# Patient Record
Sex: Female | Born: 1980 | Race: Black or African American | Hispanic: No | Smoking: Current every day smoker
Health system: Southern US, Community
[De-identification: ages and names within clinical notes are randomized; demographics above are authoritative.]

## PROBLEM LIST (undated history)

## (undated) DIAGNOSIS — E282 Polycystic ovarian syndrome: Secondary | ICD-10-CM

## (undated) DIAGNOSIS — R0789 Other chest pain: Secondary | ICD-10-CM

## (undated) DIAGNOSIS — D6861 Antiphospholipid syndrome: Secondary | ICD-10-CM

## (undated) DIAGNOSIS — A599 Trichomoniasis, unspecified: Secondary | ICD-10-CM

## (undated) DIAGNOSIS — N3090 Cystitis, unspecified without hematuria: Secondary | ICD-10-CM

## (undated) HISTORY — DX: Polycystic ovarian syndrome: E28.2

## (undated) HISTORY — PX: CHOLECYSTECTOMY: SHX55

## (undated) HISTORY — DX: Antiphospholipid syndrome: D68.61

## (undated) HISTORY — DX: Other chest pain: R07.89

---

## 1998-12-11 ENCOUNTER — Ambulatory Visit (HOSPITAL_COMMUNITY): Admission: RE | Admit: 1998-12-11 | Discharge: 1998-12-11 | Payer: Self-pay | Admitting: Obstetrics

## 1999-01-11 ENCOUNTER — Ambulatory Visit (HOSPITAL_COMMUNITY): Admission: RE | Admit: 1999-01-11 | Discharge: 1999-01-11 | Payer: Self-pay | Admitting: Obstetrics

## 1999-02-01 ENCOUNTER — Inpatient Hospital Stay (HOSPITAL_COMMUNITY): Admission: RE | Admit: 1999-02-01 | Discharge: 1999-02-01 | Payer: Self-pay | Admitting: Obstetrics

## 1999-02-05 ENCOUNTER — Encounter (HOSPITAL_COMMUNITY): Admission: RE | Admit: 1999-02-05 | Discharge: 1999-05-03 | Payer: Self-pay | Admitting: *Deleted

## 1999-02-08 ENCOUNTER — Encounter: Payer: Self-pay | Admitting: *Deleted

## 1999-02-12 ENCOUNTER — Inpatient Hospital Stay (HOSPITAL_COMMUNITY): Admission: AD | Admit: 1999-02-12 | Discharge: 1999-02-12 | Payer: Self-pay | Admitting: *Deleted

## 1999-03-26 ENCOUNTER — Inpatient Hospital Stay (HOSPITAL_COMMUNITY): Admission: AD | Admit: 1999-03-26 | Discharge: 1999-03-26 | Payer: Self-pay | Admitting: *Deleted

## 1999-04-28 ENCOUNTER — Inpatient Hospital Stay (HOSPITAL_COMMUNITY): Admission: AD | Admit: 1999-04-28 | Discharge: 1999-04-28 | Payer: Self-pay | Admitting: *Deleted

## 1999-05-02 ENCOUNTER — Inpatient Hospital Stay (HOSPITAL_COMMUNITY): Admission: AD | Admit: 1999-05-02 | Discharge: 1999-05-04 | Payer: Self-pay | Admitting: Obstetrics

## 1999-05-07 ENCOUNTER — Inpatient Hospital Stay (HOSPITAL_COMMUNITY): Admission: AD | Admit: 1999-05-07 | Discharge: 1999-05-07 | Payer: Self-pay | Admitting: *Deleted

## 1999-07-14 ENCOUNTER — Emergency Department (HOSPITAL_COMMUNITY): Admission: EM | Admit: 1999-07-14 | Discharge: 1999-07-14 | Payer: Self-pay | Admitting: Emergency Medicine

## 1999-07-14 ENCOUNTER — Encounter: Payer: Self-pay | Admitting: Internal Medicine

## 1999-07-31 ENCOUNTER — Encounter: Payer: Self-pay | Admitting: Surgery

## 1999-07-31 ENCOUNTER — Ambulatory Visit (HOSPITAL_COMMUNITY): Admission: RE | Admit: 1999-07-31 | Discharge: 1999-08-01 | Payer: Self-pay | Admitting: Surgery

## 2000-07-29 ENCOUNTER — Inpatient Hospital Stay (HOSPITAL_COMMUNITY): Admission: AD | Admit: 2000-07-29 | Discharge: 2000-07-29 | Payer: Self-pay | Admitting: Obstetrics

## 2000-07-31 ENCOUNTER — Inpatient Hospital Stay (HOSPITAL_COMMUNITY): Admission: AD | Admit: 2000-07-31 | Discharge: 2000-07-31 | Payer: Self-pay | Admitting: *Deleted

## 2001-01-22 ENCOUNTER — Inpatient Hospital Stay (HOSPITAL_COMMUNITY): Admission: AD | Admit: 2001-01-22 | Discharge: 2001-01-22 | Payer: Self-pay | Admitting: *Deleted

## 2001-01-23 ENCOUNTER — Encounter: Payer: Self-pay | Admitting: *Deleted

## 2002-04-07 ENCOUNTER — Emergency Department (HOSPITAL_COMMUNITY): Admission: EM | Admit: 2002-04-07 | Discharge: 2002-04-07 | Payer: Self-pay

## 2002-04-08 ENCOUNTER — Encounter: Payer: Self-pay | Admitting: *Deleted

## 2003-12-09 ENCOUNTER — Emergency Department (HOSPITAL_COMMUNITY): Admission: AD | Admit: 2003-12-09 | Discharge: 2003-12-09 | Payer: Self-pay | Admitting: Family Medicine

## 2004-12-01 HISTORY — PX: COLONOSCOPY: SHX174

## 2006-06-11 ENCOUNTER — Ambulatory Visit: Payer: Self-pay | Admitting: Obstetrics & Gynecology

## 2006-07-06 ENCOUNTER — Emergency Department (HOSPITAL_COMMUNITY): Admission: EM | Admit: 2006-07-06 | Discharge: 2006-07-06 | Payer: Self-pay | Admitting: Family Medicine

## 2006-07-27 ENCOUNTER — Emergency Department (HOSPITAL_COMMUNITY): Admission: EM | Admit: 2006-07-27 | Discharge: 2006-07-27 | Payer: Self-pay | Admitting: Family Medicine

## 2006-10-16 ENCOUNTER — Ambulatory Visit: Payer: Self-pay | Admitting: Gynecology

## 2007-04-20 ENCOUNTER — Emergency Department (HOSPITAL_COMMUNITY): Admission: EM | Admit: 2007-04-20 | Discharge: 2007-04-21 | Payer: Self-pay | Admitting: Emergency Medicine

## 2007-05-10 ENCOUNTER — Emergency Department (HOSPITAL_COMMUNITY): Admission: EM | Admit: 2007-05-10 | Discharge: 2007-05-10 | Payer: Self-pay | Admitting: Family Medicine

## 2007-10-13 ENCOUNTER — Emergency Department (HOSPITAL_COMMUNITY): Admission: EM | Admit: 2007-10-13 | Discharge: 2007-10-13 | Payer: Self-pay | Admitting: Family Medicine

## 2008-01-20 ENCOUNTER — Inpatient Hospital Stay (HOSPITAL_COMMUNITY): Admission: AD | Admit: 2008-01-20 | Discharge: 2008-01-20 | Payer: Self-pay | Admitting: Obstetrics & Gynecology

## 2008-05-10 ENCOUNTER — Emergency Department (HOSPITAL_COMMUNITY): Admission: EM | Admit: 2008-05-10 | Discharge: 2008-05-10 | Payer: Self-pay | Admitting: Family Medicine

## 2008-09-10 ENCOUNTER — Emergency Department (HOSPITAL_COMMUNITY): Admission: EM | Admit: 2008-09-10 | Discharge: 2008-09-10 | Payer: Self-pay | Admitting: Family Medicine

## 2009-01-13 ENCOUNTER — Emergency Department (HOSPITAL_COMMUNITY): Admission: EM | Admit: 2009-01-13 | Discharge: 2009-01-13 | Payer: Self-pay | Admitting: Family Medicine

## 2009-10-15 ENCOUNTER — Ambulatory Visit: Payer: Self-pay | Admitting: Internal Medicine

## 2009-10-15 DIAGNOSIS — J309 Allergic rhinitis, unspecified: Secondary | ICD-10-CM | POA: Insufficient documentation

## 2009-10-15 DIAGNOSIS — F17200 Nicotine dependence, unspecified, uncomplicated: Secondary | ICD-10-CM

## 2009-10-15 DIAGNOSIS — J45909 Unspecified asthma, uncomplicated: Secondary | ICD-10-CM | POA: Insufficient documentation

## 2011-03-18 LAB — URINE CULTURE: Colony Count: >=100000

## 2011-03-18 LAB — POCT URINALYSIS DIP (DEVICE)
Bilirubin Urine: NEGATIVE
Glucose, UA: NEGATIVE mg/dL
Ketones, ur: NEGATIVE mg/dL
Nitrite: POSITIVE — AB
Protein, ur: 30 mg/dL — AB
Specific Gravity, Urine: 1.02 (ref 1.005–1.030)
Urobilinogen, UA: 1 mg/dL (ref 0.0–1.0)
pH: 7 (ref 5.0–8.0)

## 2011-03-18 LAB — POCT PREGNANCY, URINE: Preg Test, Ur: NEGATIVE

## 2011-04-18 NOTE — Group Therapy Note (Signed)
NAME:  KRISTIA, JUPITER NO.:  [id number]   MEDICAL RECORD NO.:  [id number]          PATIENT TYPE:  WOC   LOCATION:  [facility]                   FACILITY:  [facility]   PHYSICIAN:  Barth Kirks, MD    DATE OF BIRTH:  [birth year]   DATE OF SERVICE:  10/16/2006                                    CLINIC NOTE   CHIEF COMPLAINT:  Is irregular vaginal bleeding follow up.   HISTORY OF PRESENT ILLNESS:  Patient is a 30 year old African-American  female, gravida 3, para 1, abortions 2, who comes in today referred for  follow up from her GYN Clinic visit on June 11, 2006 for irregular menstrual  cycles.  The patient states that she was doing well on the Ortho-Novum 777-  28 for 2 months, however, developed a cystitis and was placed on  antibiotics.  She was instructed to get off of her birth control pills  because she was told that these would interact with each other.  Since then,  she has had irregular menstrual cycles, with her last being September 12,  which lasted for 3 days and was extremely heavy, that she was changing her  pads every 1 to 2 hours.  Her last Pap smear was in January 2007 and it was  normal.  She is currently not on any medications at this time.  Today she  comes in for improvement of her irregular menstrual cycles.  Patient does  not desire pregnancy.  Patient reports that she has tried multiple various  birth control pills and would currently like to be just on one single type.   PHYSICAL EXAMINATION:  Pulse 80, blood pressure 126/77.  Weight is 192.9  pounds.  ABDOMEN:  Was normal.  Pelvic exam was not performed today.   IMPRESSION:  She has irregular menstrual cycles.   DISPOSITION:  The patient currently does not desire a pregnancy at this  time.  I instructed her on the fact that birth control pills can be used for  multiple reasons and not just for contraception, however, also for irregular  menstrual bleeding.  The patient would like to be  restarted back on birth  control pills at this time.  I went ahead and started her on Ortho-Novum  1/35.  She can start this at the beginning of her next cycle on the first  day of her bleeding.  She is to take the packet as directed, 1 tablet for  day and have a withdrawal bleed.  [facility] sm[facility].  I went ahead and gave  her a prescription for the year.  Patient does not need to return to [place]  unless there are other problems.           ______________________________  Barth Kirks, MD     MB/MEDQ  D:  10/16/2006  T:  10/16/2006  Job:  [id number]

## 2011-04-18 NOTE — Group Therapy Note (Signed)
NAME:  [name] Costa, [name] Costa NO.:  [id number]   MEDICAL RECORD NO.:  [id number]          PATIENT TYPE:  WOC   LOCATION:  [facility]                   FACILITY:  [facility]   PHYSICIAN:  Dorthula Perfect, MD     DATE OF BIRTH:  02-14-1981   DATE OF SERVICE:                                    CLINIC NOTE   HISTORY OF PRESENT ILLNESS:  This is a 30 year old black female, gravida 3,  para 1, aborted 2, who comes in today referred from medical clinics for  evaluation of irregular menstrual periods.  She apparently had a workup by  Dr. __________  for PCOS disease.  Her laboratory studies were all normal.  Her last menstrual period was in mid May, and is described as normal.  A  week and a half before that, she had bleeding spanning for 1-1/2 weeks.  She  has had no period in June or July.  She has a history of skipping up to 3 or  4 months of periods.  She also has a history of irregular bleeding.  Two  years ago, she was on the Seasonale birth control pill, and had irregular  bleeding and spotting.  She has had the same problem with Depo-Provera and  the same problem with the NuvaRing.  Her last Pap smear was in January, and  she says it was normal.   PHYSICAL EXAMINATION:  ABDOMEN:  Normal.  PELVIC:  External genitalia and BUS glands were normal.  Hair pattern was  normal.  Vaginal wall was epithelialized as was the cervix.  Uterus is in  the midline and of normal size and shape.  The adnexa structures are normal.   IMPRESSION:  Irregular menstrual cycle.   DISPOSITION:  Patient is not desirous of pregnancy at this time.  She has  been told that at such time in the future, if she desires to become  pregnant, that she may have to be on medication to improve her ovulation  status.  She is given a prescription for Provera, 7 tablets, to take daily  to bring on her menstrual period.  She is cautioned that this next period  might indeed be much heavier.  She will then start  Ortho-Novum 7/7/7 28.  She is asked to return in 4 months for followup to see if this will give her  cyclic and normal periods.           ______________________________  Dorthula Perfect, MD     ER/MEDQ  D:  06/11/2006  T:  06/11/2006  Job:  [id number]

## 2011-09-09 LAB — POCT URINALYSIS DIP (DEVICE)
Bilirubin Urine: NEGATIVE
Glucose, UA: NEGATIVE
Operator id: 239701
Specific Gravity, Urine: 1.015

## 2012-09-27 LAB — OB RESULTS CONSOLE ANTIBODY SCREEN: Antibody Screen: NEGATIVE

## 2012-09-27 LAB — OB RESULTS CONSOLE HEPATITIS B SURFACE ANTIGEN: Hepatitis B Surface Ag: NEGATIVE

## 2012-09-27 LAB — OB RESULTS CONSOLE RUBELLA ANTIBODY, IGM: Rubella: IMMUNE

## 2012-09-27 LAB — OB RESULTS CONSOLE GC/CHLAMYDIA: Chlamydia: NEGATIVE

## 2012-09-27 LAB — OB RESULTS CONSOLE HIV ANTIBODY (ROUTINE TESTING): HIV: NONREACTIVE

## 2012-10-30 ENCOUNTER — Encounter (HOSPITAL_COMMUNITY): Payer: Self-pay | Admitting: *Deleted

## 2012-10-30 ENCOUNTER — Emergency Department (HOSPITAL_COMMUNITY)
Admission: EM | Admit: 2012-10-30 | Discharge: 2012-10-31 | Disposition: A | Discharge status: Home / Self Care | Payer: Medicaid Other | Attending: Emergency Medicine | Admitting: Emergency Medicine

## 2012-10-30 ENCOUNTER — Emergency Department (HOSPITAL_COMMUNITY)
Admission: EM | Admit: 2012-10-30 | Discharge: 2012-10-30 | Disposition: A | Discharge status: Home / Self Care | Payer: Self-pay

## 2012-10-30 DIAGNOSIS — R51 Headache: Secondary | ICD-10-CM | POA: Insufficient documentation

## 2012-10-30 DIAGNOSIS — O99891 Other specified diseases and conditions complicating pregnancy: Secondary | ICD-10-CM | POA: Insufficient documentation

## 2012-10-30 NOTE — ED Notes (Addendum)
Pt c/o pain to the left side of her head behind her left ear. **side note**pt is [redacted] wks pregnant

## 2012-10-31 MED ORDER — HYDROCODONE-ACETAMINOPHEN 5-325 MG PO TABS: ORAL_TABLET | ORAL | Status: DC

## 2012-10-31 MED ORDER — HYDROMORPHONE HCL PF 1 MG/ML IJ SOLN
1.0000 mg | Freq: Once | INTRAMUSCULAR | Status: DC
  Filled 2012-10-31: qty 1

## 2012-10-31 MED ORDER — HYDROCODONE-ACETAMINOPHEN 5-325 MG PO TABS
2.0000 | ORAL_TABLET | Freq: Once | ORAL | Status: AC
  Administered 2012-10-31: 2 via ORAL
  Filled 2012-10-31: qty 2

## 2012-10-31 MED ORDER — PROMETHAZINE HCL 12.5 MG PO TABS
12.5000 mg | ORAL_TABLET | Freq: Once | ORAL | Status: AC
  Administered 2012-10-31: 12.5 mg via ORAL
  Filled 2012-10-31: qty 1

## 2012-10-31 NOTE — ED Notes (Signed)
Pt c/o left sided pressure in the head behind the left ear.  States it is very tender to the touch in that area.  Denies any trauma to the area.  States that for she has had sinus congestion for the past couple weeks and today sneezed and felt her ear "pop" which increased the pain and brought her to the ED.  Pt also reports that she is [redacted] weeks pregnant.

## 2012-10-31 NOTE — ED Provider Notes (Signed)
Medical screening examination/treatment/procedure(s) were performed by non-physician practitioner and as supervising physician I was immediately available for consultation/collaboration.    Vida Roller, MD 10/31/12 [phone]

## 2012-10-31 NOTE — ED Provider Notes (Signed)
History     CSN: [id number]  Arrival date & time 10/30/12  2252   First MD Initiated Contact with Patient 10/30/12 2359      Chief Complaint  Patient presents with  . Headache    (Consider location/radiation/quality/duration/timing/severity/associated sxs/prior treatment) HPI Comments: Patient states she is [redacted] weeks pregnant. She had A." a severe upper respiratory infection" approximately a week ago. She still has some congestion. The patient complains of a headache that is mostly on the left side that is behind the ear and going around to the posterior scalp area. Patient denies any recent injury or trauma to that area. She does not have a history of migraine related headaches. She's not had any vision changes. His been no weakness. His been no changes in her speech or ability to swallow. There's been no changes in her gait per the patient. Patient presents at this time for assistance with her discomfort.  Patient is a 31 y.o. female presenting with headaches. The history is provided by the patient.  Headache  This is a new problem. The current episode started yesterday. The problem occurs hourly. The problem has been gradually worsening. The headache is associated with nothing. The pain is located in the left unilateral region. Quality: sore. The pain is moderate. The pain does not radiate. Pertinent negatives include no palpitations and no shortness of breath.    History reviewed. No pertinent past medical history.  Past Surgical History  Procedure Date  . Cholecystectomy     History reviewed. No pertinent family history.  History  Substance Use Topics  . Smoking status: Never Smoker   . Smokeless tobacco: Not on file  . Alcohol Use: No    OB History    Grav Para Term Preterm Abortions TAB SAB Ect Mult Living   1               Review of Systems  Constitutional: Negative for activity change.       All ROS Neg except as noted in HPI  HENT: Negative for nosebleeds and  neck pain.   Eyes: Negative for photophobia and discharge.  Respiratory: Negative for cough, shortness of breath and wheezing.   Cardiovascular: Negative for chest pain and palpitations.  Gastrointestinal: Negative for abdominal pain and blood in stool.  Genitourinary: Negative for dysuria, frequency and hematuria.  Musculoskeletal: Negative for back pain and arthralgias.  Skin: Negative.   Neurological: Positive for headaches. Negative for dizziness, seizures and speech difficulty.  Psychiatric/Behavioral: Negative for hallucinations and confusion.    Allergies  Review of patient's allergies indicates no known allergies.  Home Medications   Current Outpatient Rx  Name  Route  Sig  Dispense  Refill  . PRE-NATAL PO   Oral   Take 1 tablet by mouth.           BP 115/61  Pulse 80  Temp 98.1 F (36.7 C) (Oral)  Resp 18  Ht 5\' 11"  (1.803 m)  Wt 193 lb (87.544 kg)  BMI 26.92 kg/m2  SpO2 100%  Physical Exam  Nursing note and vitals reviewed. Constitutional: She is oriented to person, place, and time. She appears well-developed and well-nourished.  Non-toxic appearance.  HENT:  Head: Normocephalic.  Right Ear: Tympanic membrane and external ear normal.  Left Ear: Tympanic membrane and external ear normal.       Nasal congestion present. TM's wnl bilat. No redness or swelling behind the ears. Pain to palpation of the posterior left scalp.  Eyes:  EOM and lids are normal. Pupils are equal, round, and reactive to light.  Neck: Normal range of motion. Neck supple. Carotid bruit is not present.  Cardiovascular: Normal rate, regular rhythm, normal heart sounds, intact distal pulses and normal pulses.   Pulmonary/Chest: Breath sounds normal. No respiratory distress.  Abdominal: Soft. Bowel sounds are normal. There is no tenderness. There is no guarding.       Gravid  Musculoskeletal: Normal range of motion.  Lymphadenopathy:       Head (right side): No submandibular adenopathy  present.       Head (left side): No submandibular adenopathy present.    She has no cervical adenopathy.  Neurological: She is alert and oriented to person, place, and time. She has normal strength. No cranial nerve deficit or sensory deficit. She exhibits normal muscle tone. Coordination normal.  Skin: Skin is warm and dry.  Psychiatric: She has a normal mood and affect. Her speech is normal.    ED Course  Procedures (including critical care time)  Labs Reviewed - No data to display No results found.   No diagnosis found.    MDM  I have reviewed nursing notes, vital signs, and all appropriate lab and imaging results for this patient. Vital signs reviewed. After oral Norco tablets, patient states headache is much improved. No gross neurologic deficits or neural exam changes at the time of discharge. Patient advised to use Afrin spray every 12 hours for 5 days only, extra strength Tylenol every 4 hours as needed for pain. Norco 5 mg #10 tablets given to be used  at bedtime only. Patient see her primary physician or GYN specialist for additional evaluation.       Kathie Dike, [place] 10/31/12 [phone]

## 2013-03-16 LAB — OB RESULTS CONSOLE GBS: GBS: NEGATIVE

## 2013-04-12 ENCOUNTER — Inpatient Hospital Stay (HOSPITAL_COMMUNITY)
Admission: AD | Admit: 2013-04-12 | Discharge: 2013-04-14 | DRG: 767 | Disposition: A | Discharge status: Home / Self Care | Payer: Medicaid Other | Source: Ambulatory Visit | Attending: Obstetrics and Gynecology | Admitting: Obstetrics and Gynecology

## 2013-04-12 ENCOUNTER — Encounter (HOSPITAL_COMMUNITY): Payer: Self-pay | Admitting: Anesthesiology

## 2013-04-12 ENCOUNTER — Inpatient Hospital Stay (HOSPITAL_COMMUNITY): Payer: Medicaid Other | Admitting: Anesthesiology

## 2013-04-12 ENCOUNTER — Encounter (HOSPITAL_COMMUNITY): Payer: Self-pay | Admitting: *Deleted

## 2013-04-12 DIAGNOSIS — Z302 Encounter for sterilization: Secondary | ICD-10-CM

## 2013-04-12 HISTORY — DX: Cystitis, unspecified without hematuria: N30.90

## 2013-04-12 HISTORY — DX: Trichomoniasis, unspecified: A59.9

## 2013-04-12 LAB — CBC
HCT: 33.1 % — ABNORMAL LOW (ref 36.0–46.0)
Hemoglobin: 10.6 g/dL — ABNORMAL LOW (ref 12.0–15.0)
MCHC: 32 g/dL (ref 30.0–36.0)
MCV: 83.8 fL (ref 78.0–100.0)

## 2013-04-12 LAB — POCT FERN TEST: POCT Fern Test: POSITIVE

## 2013-04-12 MED ORDER — WITCH HAZEL-GLYCERIN EX PADS
1.0000 "application " | MEDICATED_PAD | CUTANEOUS | Status: DC | PRN
  Administered 2013-04-12: 1 via TOPICAL

## 2013-04-12 MED ORDER — ONDANSETRON HCL 4 MG/2ML IJ SOLN: 4.0000 mg | INTRAMUSCULAR | Status: DC | PRN

## 2013-04-12 MED ORDER — OXYTOCIN 40 UNITS IN LACTATED RINGERS INFUSION - SIMPLE MED: 62.5000 mL/h | INTRAVENOUS | Status: DC | PRN

## 2013-04-12 MED ORDER — LIDOCAINE HCL (PF) 1 % IJ SOLN
INTRAMUSCULAR | Status: DC | PRN
  Administered 2013-04-12 (×2): 5 mL

## 2013-04-12 MED ORDER — SIMETHICONE 80 MG PO CHEW
80.0000 mg | CHEWABLE_TABLET | ORAL | Status: DC | PRN
  Administered 2013-04-13: 80 mg via ORAL

## 2013-04-12 MED ORDER — ONDANSETRON HCL 4 MG PO TABS: 4.0000 mg | ORAL_TABLET | ORAL | Status: DC | PRN

## 2013-04-12 MED ORDER — ONDANSETRON HCL 4 MG/2ML IJ SOLN: 4.0000 mg | Freq: Four times a day (QID) | INTRAMUSCULAR | Status: DC | PRN

## 2013-04-12 MED ORDER — TERBUTALINE SULFATE 1 MG/ML IJ SOLN: 0.2500 mg | Freq: Once | INTRAMUSCULAR | Status: DC | PRN

## 2013-04-12 MED ORDER — IBUPROFEN 600 MG PO TABS
600.0000 mg | ORAL_TABLET | Freq: Four times a day (QID) | ORAL | Status: DC
  Administered 2013-04-13 – 2013-04-14 (×4): 600 mg via ORAL
  Filled 2013-04-12 (×4): qty 1

## 2013-04-12 MED ORDER — DIPHENHYDRAMINE HCL 50 MG/ML IJ SOLN: 12.5000 mg | INTRAMUSCULAR | Status: DC | PRN

## 2013-04-12 MED ORDER — CITRIC ACID-SODIUM CITRATE 334-500 MG/5ML PO SOLN: 30.0000 mL | ORAL | Status: DC | PRN

## 2013-04-12 MED ORDER — IBUPROFEN 600 MG PO TABS
600.0000 mg | ORAL_TABLET | Freq: Four times a day (QID) | ORAL | Status: DC | PRN
  Administered 2013-04-12: 600 mg via ORAL
  Filled 2013-04-12: qty 1

## 2013-04-12 MED ORDER — PHENYLEPHRINE 40 MCG/ML (10ML) SYRINGE FOR IV PUSH (FOR BLOOD PRESSURE SUPPORT)
80.0000 ug | PREFILLED_SYRINGE | INTRAVENOUS | Status: DC | PRN
  Filled 2013-04-12: qty 2

## 2013-04-12 MED ORDER — LACTATED RINGERS IV SOLN: 500.0000 mL | Freq: Once | INTRAVENOUS | Status: DC

## 2013-04-12 MED ORDER — OXYCODONE-ACETAMINOPHEN 5-325 MG PO TABS
1.0000 | ORAL_TABLET | ORAL | Status: DC | PRN
  Administered 2013-04-13 – 2013-04-14 (×3): 1 via ORAL
  Filled 2013-04-12 (×3): qty 1

## 2013-04-12 MED ORDER — DIBUCAINE 1 % RE OINT: 1.0000 "application " | TOPICAL_OINTMENT | RECTAL | Status: DC | PRN

## 2013-04-12 MED ORDER — SENNOSIDES-DOCUSATE SODIUM 8.6-50 MG PO TABS
2.0000 | ORAL_TABLET | Freq: Every day | ORAL | Status: DC
  Administered 2013-04-13: 2 via ORAL

## 2013-04-12 MED ORDER — OXYTOCIN BOLUS FROM INFUSION: 500.0000 mL | INTRAVENOUS | Status: DC

## 2013-04-12 MED ORDER — LACTATED RINGERS IV SOLN: INTRAVENOUS | Status: DC

## 2013-04-12 MED ORDER — OXYTOCIN 40 UNITS IN LACTATED RINGERS INFUSION - SIMPLE MED
1.0000 m[IU]/min | INTRAVENOUS | Status: DC
  Administered 2013-04-12: 1 m[IU]/min via INTRAVENOUS
  Filled 2013-04-12: qty 1000

## 2013-04-12 MED ORDER — BENZOCAINE-MENTHOL 20-0.5 % EX AERO
1.0000 "application " | INHALATION_SPRAY | CUTANEOUS | Status: DC | PRN
  Administered 2013-04-12: 1 via TOPICAL
  Filled 2013-04-12: qty 56

## 2013-04-12 MED ORDER — EPHEDRINE 5 MG/ML INJ
10.0000 mg | INTRAVENOUS | Status: DC | PRN
  Filled 2013-04-12: qty 2
  Filled 2013-04-12: qty 4

## 2013-04-12 MED ORDER — ZOLPIDEM TARTRATE 5 MG PO TABS: 5.0000 mg | ORAL_TABLET | Freq: Every evening | ORAL | Status: DC | PRN

## 2013-04-12 MED ORDER — EPHEDRINE 5 MG/ML INJ
10.0000 mg | INTRAVENOUS | Status: DC | PRN
  Filled 2013-04-12: qty 2

## 2013-04-12 MED ORDER — PHENYLEPHRINE 40 MCG/ML (10ML) SYRINGE FOR IV PUSH (FOR BLOOD PRESSURE SUPPORT)
80.0000 ug | PREFILLED_SYRINGE | INTRAVENOUS | Status: DC | PRN
  Filled 2013-04-12: qty 2
  Filled 2013-04-12: qty 5

## 2013-04-12 MED ORDER — DIPHENHYDRAMINE HCL 25 MG PO CAPS: 25.0000 mg | ORAL_CAPSULE | Freq: Four times a day (QID) | ORAL | Status: DC | PRN

## 2013-04-12 MED ORDER — FENTANYL 2.5 MCG/ML BUPIVACAINE 1/10 % EPIDURAL INFUSION (WH - ANES)
14.0000 mL/h | INTRAMUSCULAR | Status: DC | PRN
  Filled 2013-04-12: qty 125

## 2013-04-12 MED ORDER — PRENATAL MULTIVITAMIN CH: 1.0000 | ORAL_TABLET | Freq: Every day | ORAL | Status: DC

## 2013-04-12 MED ORDER — METOCLOPRAMIDE HCL 10 MG PO TABS
10.0000 mg | ORAL_TABLET | Freq: Once | ORAL | Status: AC
  Administered 2013-04-13: 10 mg via ORAL
  Filled 2013-04-12: qty 1

## 2013-04-12 MED ORDER — LANOLIN HYDROUS EX OINT: TOPICAL_OINTMENT | CUTANEOUS | Status: DC | PRN

## 2013-04-12 MED ORDER — OXYCODONE-ACETAMINOPHEN 5-325 MG PO TABS: 1.0000 | ORAL_TABLET | ORAL | Status: DC | PRN

## 2013-04-12 MED ORDER — LIDOCAINE HCL (PF) 1 % IJ SOLN
30.0000 mL | INTRAMUSCULAR | Status: DC | PRN
  Filled 2013-04-12: qty 30

## 2013-04-12 MED ORDER — LACTATED RINGERS IV SOLN
INTRAVENOUS | Status: DC
  Administered 2013-04-13: 12:00:00 via INTRAVENOUS

## 2013-04-12 MED ORDER — FAMOTIDINE 20 MG PO TABS
40.0000 mg | ORAL_TABLET | Freq: Once | ORAL | Status: AC
  Administered 2013-04-13: 40 mg via ORAL
  Filled 2013-04-12: qty 1

## 2013-04-12 MED ORDER — LACTATED RINGERS IV SOLN: 500.0000 mL | INTRAVENOUS | Status: DC | PRN

## 2013-04-12 MED ORDER — TETANUS-DIPHTH-ACELL PERTUSSIS 5-2.5-18.5 LF-MCG/0.5 IM SUSP: 0.5000 mL | Freq: Once | INTRAMUSCULAR | Status: DC

## 2013-04-12 MED ORDER — OXYTOCIN 40 UNITS IN LACTATED RINGERS INFUSION - SIMPLE MED: 62.5000 mL/h | INTRAVENOUS | Status: DC

## 2013-04-12 MED ORDER — ACETAMINOPHEN 325 MG PO TABS: 650.0000 mg | ORAL_TABLET | ORAL | Status: DC | PRN

## 2013-04-12 MED ORDER — FENTANYL 2.5 MCG/ML BUPIVACAINE 1/10 % EPIDURAL INFUSION (WH - ANES)
INTRAMUSCULAR | Status: DC | PRN
  Administered 2013-04-12: 16 mL/h via EPIDURAL

## 2013-04-12 MED ORDER — MEASLES, MUMPS & RUBELLA VAC ~~LOC~~ INJ
0.5000 mL | INJECTION | Freq: Once | SUBCUTANEOUS | Status: DC
  Filled 2013-04-12: qty 0.5

## 2013-04-12 NOTE — Anesthesia Preprocedure Evaluation (Signed)
Anesthesia Evaluation  Patient identified by MRN, date of birth, ID band Patient awake    Reviewed: Allergy & Precautions, H&P , Patient's Chart, lab work & pertinent test results  Airway Mallampati: III TM Distance: >3 FB Neck ROM: Full    Dental no notable dental hx. (+) Teeth Intact   Pulmonary asthma ,  breath sounds clear to auscultation  Pulmonary exam normal       Cardiovascular negative cardio ROS  Rhythm:Regular Rate:Normal     Neuro/Psych PSYCHIATRIC DISORDERS negative neurological ROS     GI/Hepatic negative GI ROS, Neg liver ROS,   Endo/Other  Obesity  Renal/GU negative Renal ROS  negative genitourinary   Musculoskeletal negative musculoskeletal ROS (+)   Abdominal (+) + obese,   Peds  Hematology negative hematology ROS (+)   Anesthesia Other Findings   Reproductive/Obstetrics (+) Pregnancy                           Anesthesia Physical Anesthesia Plan  ASA: II  Anesthesia Plan: Epidural   Post-op Pain Management:    Induction:   Airway Management Planned: Natural Airway  Additional Equipment:   Intra-op Plan:   Post-operative Plan:   Informed Consent: I have reviewed the patients History and Physical, chart, labs and discussed the procedure including the risks, benefits and alternatives for the proposed anesthesia with the patient or authorized representative who has indicated his/her understanding and acceptance.   Dental advisory given  Plan Discussed with: Anesthesiologist, CRNA and Surgeon  Anesthesia Plan Comments:         Anesthesia Quick Evaluation

## 2013-04-12 NOTE — Anesthesia Procedure Notes (Signed)
Epidural Patient location during procedure: OB Start time: 04/12/2013 1:24 PM  Staffing Anesthesiologist: Irineo Gaulin A. Performed by: anesthesiologist   Preanesthetic Checklist Completed: patient identified, site marked, surgical consent, pre-op evaluation, timeout performed, IV checked, risks and benefits discussed and monitors and equipment checked  Epidural Patient position: sitting Prep: site prepped and draped and DuraPrep Patient monitoring: continuous pulse ox and blood pressure Approach: midline Injection technique: LOR air  Needle:  Needle type: Tuohy  Needle gauge: 17 G Needle length: 9 cm and 9 Needle insertion depth: 6 cm Catheter type: closed end flexible Catheter size: 19 Gauge Catheter at skin depth: 11 cm Test dose: negative and Other  Assessment Events: blood not aspirated, injection not painful, no injection resistance, negative IV test and no paresthesia  Additional Notes Patient identified. Risks and benefits discussed including failed block, incomplete  Pain control, post dural puncture headache, nerve damage, paralysis, blood pressure Changes, nausea, vomiting, reactions to medications-both toxic and allergic and post Partum back pain. All questions were answered. Patient expressed understanding and wished to proceed. Sterile technique was used throughout procedure. Epidural site was Dressed with sterile barrier dressing. No paresthesias, signs of intravascular injection Or signs of intrathecal spread were encountered.  Patient was more comfortable after the epidural was dosed. Please see RN's note for documentation of vital signs and FHR which are stable.

## 2013-04-12 NOTE — Progress Notes (Signed)
Patient ID: [name] Costa, female   DOB: 08/03/1981, 32 y.o.   MRN: [id number] Pt is on 5 mu/ minute of pitocin and has an epidural. She is contracting regularly. At about 4:40 PM the cervix was 8-9 cm 100% effaced and the vertex was at +1 station.

## 2013-04-12 NOTE — Progress Notes (Signed)
Patient ID: [name] Costa, female   DOB: 01/28/1981, 32 y.o.   MRN: [id number] Delivery note:  The pt pushed well but made very slow progress because of O[P position of the vertex.Finally, the pt delivered a living female infant spontaneously OP over an intact perineum with a left labial laceration a living female infant with Apgars of 8 and 9 at 1 and 5 minutes. There was a large amount of yellow fluid over the baby's nostrils at delivery that was suctioned with the bulb. The placenta delivered intact and the uterus was normal. EBL 400 cc's.

## 2013-04-12 NOTE — MAU Note (Signed)
States water broke at 0900. Having UC's.

## 2013-04-12 NOTE — H&P (Signed)
[name] Costa Costa, [name] Costa NO.:  [id number]  MEDICAL RECORD NO.:  [id number]  LOCATION:  9169                          [facility]ILITY[facility]  PHYSICIAN:  Malachi Pro. Ambrose Mantle, M.D. DATE OF BIRTH:  11/09/1981  DATE OF ADMISSION:  04/12/2013 DATE OF DISCHARGE:                             HISTORY & PHYSICAL   PRESENT ILLNESS:  This is a 32 year old black female, para 1-0-3-1, gravida 5, with [facility] Apr 16, 2013, by an ultrasound at 9 weeks and 5 days on September 16, 2012, admitted with ruptured membranes and uterine contractions.  Blood group and type O positive.  Negative antibody.  Pap smear normal.  Rubella immune.  RPR nonreactive.  Urine culture negative.  Hepatitis B surface antigen negative.  HIV negative, GC and Chlamydia negative.  Hemoglobin electrophoresis AA.  First trimester screen negative.  Cystic fibrosis negative.  AFP negative.  One hour Glucola 87, group B strep negative.  The patient began her prenatal course on September 27, 2012, at 11 weeks and 2 days.  She had a history of delivery at 42 weeks.  She stated that she was told she had a short cervix, but this came from an ultrasound at 36 weeks and 3 days when the cervix measured 1.7 cm and I assured her that was of no importance.  The patient complained of a heartburn and was tried on Zantac, Tums.  She discussed tubal ligation with Dr. Senaida Ores.  She had a reasonably benign prenatal course at her last visit on May 9.  Cervix was 2 cm, 50%.  She chose to await for labor after being offered induction of labor.  PAST MEDICAL HISTORY:  History of anemia, arthritis, asthma and a swollen duct in her breast, also had a history of situational depression, polycystic ovarian syndrome, and vaginitis from Trichomonas.  SURGICAL HISTORY:  Laparoscopic cholecystectomy, diagnostic colonoscopy for rectal bleeding.  Polyps were removed none since.  She has no known allergy and no latex allergy.  FAMILY HISTORY:  Mother,  brother, father and maternal grandmother with high blood pressure.  Maternal grandmother with diabetes and breast cancer.  A cousin had bone cancer in his knee and died at age 25.  OBSTETRIC HISTORY:  In 2000, she delivered a 7-pound 3-ounce female infant vaginally at 42 weeks.  In 2001, she had a spontaneous abortion, followed by termination of pregnancy in 2001 and then in 2008 she had a termination of pregnancy.  She is active, but no formal exercise.  She has an associates degree from the [employer].  Denies illicit drug use.  Formerly smoked cigarettes from age [age], 1/2 pack a day. 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  PHYSICAL EXAMINATION:  GENERAL:  Well-developed, well-nourished black female, in no distress except the uterine contractions. VITAL SIGNS:  Temperature is 97.8, pulse 75, respirations 18, blood pressure 127/76. HEART:  Normal size and sounds.  No murmurs. LUNGS:  Clear to auscultation. ABDOMEN:  Fundal height is appropriate for gestational age.  The cervix is 3-4 cm, 70% effaced, vertex at a -3 station.  The patient states her membranes ruptured at 9:00 a.m. today.  ADMITTING IMPRESSION:  Intrauterine pregnancy at 39+ weeks, rupture of membranes, regular contractions.  The patient requested an epidural. She will receive an epidural if her labor fails to progress the cervix and will add Pitocin.     Malachi Pro. Ambrose Mantle, M.D.     TFH/MEDQ  D:  04/12/2013  T:  04/12/2013  Job:  [id number]

## 2013-04-13 ENCOUNTER — Encounter (HOSPITAL_COMMUNITY): Payer: Self-pay | Admitting: Registered Nurse

## 2013-04-13 ENCOUNTER — Encounter (HOSPITAL_COMMUNITY): Payer: Self-pay | Admitting: Anesthesiology

## 2013-04-13 ENCOUNTER — Inpatient Hospital Stay (HOSPITAL_COMMUNITY): Payer: Medicaid Other | Admitting: Anesthesiology

## 2013-04-13 ENCOUNTER — Encounter (HOSPITAL_COMMUNITY)
Admission: AD | Disposition: A | Discharge status: Home / Self Care | Payer: Self-pay | Source: Ambulatory Visit | Attending: Obstetrics and Gynecology

## 2013-04-13 HISTORY — PX: TUBAL LIGATION: SHX77

## 2013-04-13 LAB — CBC
Platelets: 209 10*3/uL (ref 150–400)
RBC: 3.61 MIL/uL — ABNORMAL LOW (ref 3.87–5.11)
RDW: 14.5 % (ref 11.5–15.5)
WBC: 20.5 10*3/uL — ABNORMAL HIGH (ref 4.0–10.5)

## 2013-04-13 LAB — ABO/RH: ABO/RH(D): O POS

## 2013-04-13 LAB — SURGICAL PCR SCREEN: MRSA, PCR: NEGATIVE

## 2013-04-13 SURGERY — LIGATION, FALLOPIAN TUBE, POSTPARTUM
Anesthesia: Epidural | Site: Abdomen | Laterality: Bilateral | Wound class: Clean Contaminated

## 2013-04-13 MED ORDER — FENTANYL CITRATE 0.05 MG/ML IJ SOLN
INTRAMUSCULAR | Status: DC | PRN
  Administered 2013-04-13 (×2): 50 ug via INTRAVENOUS

## 2013-04-13 MED ORDER — HYDROMORPHONE HCL PF 1 MG/ML IJ SOLN
INTRAMUSCULAR | Status: AC
  Administered 2013-04-13: 0.25 mg via INTRAVENOUS
  Filled 2013-04-13: qty 1

## 2013-04-13 MED ORDER — KETOROLAC TROMETHAMINE 30 MG/ML IJ SOLN
INTRAMUSCULAR | Status: DC | PRN
  Administered 2013-04-13: 30 mg via INTRAVENOUS

## 2013-04-13 MED ORDER — 0.9 % SODIUM CHLORIDE (POUR BTL) OPTIME
TOPICAL | Status: DC | PRN
  Administered 2013-04-13: 1000 mL

## 2013-04-13 MED ORDER — LIDOCAINE HCL (CARDIAC) 20 MG/ML IV SOLN
INTRAVENOUS | Status: AC
  Filled 2013-04-13: qty 5

## 2013-04-13 MED ORDER — ONDANSETRON HCL 4 MG/2ML IJ SOLN
INTRAMUSCULAR | Status: DC | PRN
  Administered 2013-04-13: 4 mg via INTRAVENOUS

## 2013-04-13 MED ORDER — HYDROMORPHONE HCL PF 1 MG/ML IJ SOLN: 0.2500 mg | INTRAMUSCULAR | Status: DC | PRN

## 2013-04-13 MED ORDER — ONDANSETRON HCL 4 MG/2ML IJ SOLN
INTRAMUSCULAR | Status: AC
  Filled 2013-04-13: qty 2

## 2013-04-13 MED ORDER — MIDAZOLAM HCL 2 MG/2ML IJ SOLN
INTRAMUSCULAR | Status: AC
  Filled 2013-04-13: qty 2

## 2013-04-13 MED ORDER — MEPERIDINE HCL 25 MG/ML IJ SOLN: 6.2500 mg | INTRAMUSCULAR | Status: DC | PRN

## 2013-04-13 MED ORDER — LIDOCAINE-EPINEPHRINE (PF) 2 %-1:200000 IJ SOLN
INTRAMUSCULAR | Status: AC
  Filled 2013-04-13: qty 20

## 2013-04-13 MED ORDER — SODIUM BICARBONATE 8.4 % IV SOLN
INTRAVENOUS | Status: AC
  Filled 2013-04-13: qty 50

## 2013-04-13 MED ORDER — PROPOFOL 10 MG/ML IV BOLUS
INTRAVENOUS | Status: DC | PRN
  Administered 2013-04-13: 20 mg via INTRAVENOUS

## 2013-04-13 MED ORDER — KETOROLAC TROMETHAMINE 30 MG/ML IJ SOLN
INTRAMUSCULAR | Status: AC
  Filled 2013-04-13: qty 1

## 2013-04-13 MED ORDER — PROMETHAZINE HCL 25 MG/ML IJ SOLN: 6.2500 mg | INTRAMUSCULAR | Status: DC | PRN

## 2013-04-13 MED ORDER — DEXAMETHASONE SODIUM PHOSPHATE 10 MG/ML IJ SOLN
INTRAMUSCULAR | Status: AC
  Filled 2013-04-13: qty 1

## 2013-04-13 MED ORDER — LIDOCAINE HCL (CARDIAC) 20 MG/ML IV SOLN
INTRAVENOUS | Status: DC | PRN
  Administered 2013-04-13: 30 mg via INTRAVENOUS

## 2013-04-13 MED ORDER — FENTANYL CITRATE 0.05 MG/ML IJ SOLN
INTRAMUSCULAR | Status: AC
  Filled 2013-04-13: qty 2

## 2013-04-13 MED ORDER — PROPOFOL 10 MG/ML IV EMUL
INTRAVENOUS | Status: AC
  Filled 2013-04-13: qty 20

## 2013-04-13 MED ORDER — KETOROLAC TROMETHAMINE 30 MG/ML IJ SOLN: 15.0000 mg | Freq: Once | INTRAMUSCULAR | Status: DC | PRN

## 2013-04-13 MED ORDER — MIDAZOLAM HCL 5 MG/5ML IJ SOLN
INTRAMUSCULAR | Status: DC | PRN
  Administered 2013-04-13: 2 mg via INTRAVENOUS

## 2013-04-13 MED ORDER — SODIUM BICARBONATE 8.4 % IV SOLN
INTRAVENOUS | Status: DC | PRN
  Administered 2013-04-13: 5 mL via EPIDURAL

## 2013-04-13 SURGICAL SUPPLY — 23 items
CLOTH BEACON ORANGE TIMEOUT ST (SAFETY) ×2 IMPLANT
CONTAINER PREFILL 10% NBF 15ML (MISCELLANEOUS) ×4 IMPLANT
DRSG COVADERM PLUS 2X2 (GAUZE/BANDAGES/DRESSINGS) ×1 IMPLANT
ELECT REM PT RETURN 9FT ADLT (ELECTROSURGICAL) ×2
ELECTRODE REM PT RTRN 9FT ADLT (ELECTROSURGICAL) ×1 IMPLANT
GLOVE BIO SURGEON STRL SZ7.5 (GLOVE) ×2 IMPLANT
GOWN PREVENTION PLUS LG XLONG (DISPOSABLE) ×2 IMPLANT
GOWN PREVENTION PLUS XLARGE (GOWN DISPOSABLE) ×2 IMPLANT
NS IRRIG 1000ML POUR BTL (IV SOLUTION) ×2 IMPLANT
PACK ABDOMINAL MINOR (CUSTOM PROCEDURE TRAY) ×2 IMPLANT
PENCIL BUTTON HOLSTER BLD 10FT (ELECTRODE) ×2 IMPLANT
SPONGE LAP 4X18 X RAY DECT (DISPOSABLE) IMPLANT
SUT PDS AB 1 CT1 36 (SUTURE) ×1 IMPLANT
SUT PLAIN 0 NONE (SUTURE) ×4 IMPLANT
SUT PLAIN 2 0 (SUTURE)
SUT PLAIN 3 0 X 1 18 (SUTURE) ×2 IMPLANT
SUT PLAIN ABS 2-0 54XMFL TIE (SUTURE) IMPLANT
SUT VIC AB 0 CT1 27 (SUTURE) ×2
SUT VIC AB 0 CT1 27XBRD ANBCTR (SUTURE) ×1 IMPLANT
SUT VIC AB 3-0 CTX 36 (SUTURE) IMPLANT
TOWEL OR 17X24 6PK STRL BLUE (TOWEL DISPOSABLE) ×4 IMPLANT
TRAY FOLEY CATH 14FR (SET/KITS/TRAYS/PACK) ×2 IMPLANT
WATER STERILE IRR 1000ML POUR (IV SOLUTION) ×2 IMPLANT

## 2013-04-13 NOTE — Progress Notes (Signed)
Patient ID: [name] Costa, female   DOB: 02/10/1981, 32 y.o.   MRN: [id number] #1 Afebrile BP normal HGB stable Scheduled for tubal if sterilization forms can be found

## 2013-04-13 NOTE — Anesthesia Postprocedure Evaluation (Signed)
Anesthesia Post Note  Patient: [name] Costa  Procedure(s) Performed: * No procedures listed *  Anesthesia type: Epidural  Patient location: Mother/Baby  Post pain: Pain level controlled  Post assessment: Post-op Vital signs reviewed  Last Vitals: BP 130/77  Pulse 76  Temp(Src) 36.6 C (Oral)  Resp 18  Ht 5\' 11"  (1.803 m)  Wt 230 lb (104.327 kg)  BMI 32.09 kg/m2  SpO2 99%  Post vital signs: Reviewed  Level of consciousness: awake  Complications: No apparent anesthesia complications

## 2013-04-13 NOTE — Progress Notes (Signed)
Patient was referred for history of depression/anxiety. * Referral screened out by Clinical Social Worker because none of the following criteria appear to apply:  ~ History of anxiety/depression during this pregnancy, or of post-partum depression.  ~ Diagnosis of anxiety and/or depression within last 4 years, as per pt.  ~ History of depression due to pregnancy loss/loss of child  OR * Patient's symptoms currently being treated with medication and/or therapy.  Please contact the Clinical Social Worker if needs arise, or by the patient's request.

## 2013-04-13 NOTE — Preoperative (Signed)
Beta Blockers   Reason not to administer Beta Blockers:Not Applicable 

## 2013-04-13 NOTE — Anesthesia Postprocedure Evaluation (Signed)
Anesthesia Post Note  Patient: [name] Costa  Procedure(s) Performed: Procedure(s) (LRB): POST PARTUM TUBAL LIGATION (Bilateral)  Anesthesia type: Epidural  Patient location: PACU  Post pain: Pain level controlled  Post assessment: Post-op Vital signs reviewed  Last Vitals:  Filed Vitals:   04/13/13 1415  BP: 122/66  Pulse: 71  Temp:   Resp: 16    Post vital signs: Reviewed  Level of consciousness: awake  Complications: No apparent anesthesia complications

## 2013-04-13 NOTE — Anesthesia Preprocedure Evaluation (Signed)
Anesthesia Evaluation  Patient identified by MRN, date of birth, ID band Patient awake    Reviewed: Allergy & Precautions, H&P , Patient's Chart, lab work & pertinent test results  Airway Mallampati: III TM Distance: >3 FB Neck ROM: Full    Dental no notable dental hx. (+) Teeth Intact   Pulmonary asthma ,  breath sounds clear to auscultation  Pulmonary exam normal       Cardiovascular negative cardio ROS  Rhythm:Regular Rate:Normal     Neuro/Psych PSYCHIATRIC DISORDERS negative neurological ROS     GI/Hepatic negative GI ROS, Neg liver ROS,   Endo/Other  Obesity  Renal/GU negative Renal ROS  negative genitourinary   Musculoskeletal negative musculoskeletal ROS (+)   Abdominal (+) + obese,   Peds  Hematology negative hematology ROS (+)   Anesthesia Other Findings   Reproductive/Obstetrics negative OB ROS                           Anesthesia Physical  Anesthesia Plan  ASA: II  Anesthesia Plan: Epidural   Post-op Pain Management:    Induction:   Airway Management Planned: Natural Airway  Additional Equipment:   Intra-op Plan:   Post-operative Plan:   Informed Consent: I have reviewed the patients History and Physical, chart, labs and discussed the procedure including the risks, benefits and alternatives for the proposed anesthesia with the patient or authorized representative who has indicated his/her understanding and acceptance.   Dental advisory given  Plan Discussed with: Anesthesiologist, CRNA and Surgeon  Anesthesia Plan Comments:         Anesthesia Quick Evaluation

## 2013-04-13 NOTE — Transfer of Care (Signed)
Immediate Anesthesia Transfer of Care Note  Patient: [name] Costa  Procedure(s) Performed: Procedure(s): POST PARTUM TUBAL LIGATION (Bilateral)  Patient Location: PACU  Anesthesia Type:Epidural  Level of Consciousness: awake, alert  and oriented  Airway & Oxygen Therapy: Patient Spontanous Breathing  Post-op Assessment: Report given to PACU RN  Post vital signs: Reviewed  Complications: No apparent anesthesia complications

## 2013-04-13 NOTE — Progress Notes (Signed)
UR chart review completed.  

## 2013-04-13 NOTE — Progress Notes (Signed)
Patient ID: [name] Costa, female   DOB: Oct 18, 1981, 32 y.o.   MRN: [id number] Op Note:  BTL done with epidural anesthesia. No complications apparent EBL 10  Cc's.

## 2013-04-14 ENCOUNTER — Encounter (HOSPITAL_COMMUNITY): Payer: Self-pay | Admitting: Obstetrics and Gynecology

## 2013-04-14 LAB — CBC WITH DIFFERENTIAL/PLATELET
Basophils Absolute: 0 10*3/uL (ref 0.0–0.1)
Basophils Relative: 0 % (ref 0–1)
Eosinophils Absolute: 0.2 10*3/uL (ref 0.0–0.7)
HCT: 29 % — ABNORMAL LOW (ref 36.0–46.0)
Hemoglobin: 9.4 g/dL — ABNORMAL LOW (ref 12.0–15.0)
MCH: 27.3 pg (ref 26.0–34.0)
MCHC: 32.4 g/dL (ref 30.0–36.0)
Monocytes Absolute: 1 10*3/uL (ref 0.1–1.0)
Monocytes Relative: 7 % (ref 3–12)
Neutro Abs: 11.2 10*3/uL — ABNORMAL HIGH (ref 1.7–7.7)
Neutrophils Relative %: 72 % (ref 43–77)
RDW: 14.8 % (ref 11.5–15.5)

## 2013-04-14 MED ORDER — OXYCODONE-ACETAMINOPHEN 5-325 MG PO TABS: 1.0000 | ORAL_TABLET | Freq: Four times a day (QID) | ORAL | Status: DC | PRN

## 2013-04-14 MED ORDER — IBUPROFEN 600 MG PO TABS: 600.0000 mg | ORAL_TABLET | Freq: Four times a day (QID) | ORAL | Status: DC | PRN

## 2013-04-14 MED ORDER — BUPIVACAINE HCL (PF) 0.25 % IJ SOLN
INTRAMUSCULAR | Status: AC
  Filled 2013-04-14: qty 30

## 2013-04-14 NOTE — Op Note (Signed)
NAMEBLYTHE, [name] Costa NO.:  [id number]  MEDICAL RECORD NO.:  [id number]  LOCATION:  9111                          FACILITY:  [facility]  PHYSICIAN:  Malachi Pro. Ambrose Mantle, M.D. DATE OF BIRTH:  1980-12-29  DATE OF PROCEDURE:  04/13/2013 DATE OF DISCHARGE:                              OPERATIVE REPORT   PREOPERATIVE DIAGNOSIS:  Voluntary sterilization.  POSTOPERATIVE DIAGNOSIS:  Voluntary sterilization.  OPERATION:  Bilateral tubal ligation.  OPERATOR:  Malachi Pro. Ambrose Mantle, M.D.  ANESTHESIA:  Epidural anesthesia.  DESCRIPTION OF PROCEDURE:  The patient was brought to the operating room.  The epidural anesthetic was boosted.  A Foley catheter was inserted after the urethra was prepped.  The abdomen was then prepped with a DuraPrep and waited 3 minutes for it to dry.  A time-out was done.  The abdomen was then draped as a sterile field.  A small semi- lunar incision was made in the inferior portion of the umbilicus, it was carried in layers down to the fascia.  The fascia was incised. Peritoneum was opened.  I needed to use 1 pack to identify the left tube and otherwise the tubes were identified, traced to their fimbriated ends.  A window was made in the mesosalpinx bilaterally.  Two ties of 0 plain catgut were placed proximally and distally.  The segment of tube on both sides was excised and sent to Pathology.  The right tubal segment was shorter than the left.  The right tubal segment was about a cm in length.  The left one was about an inch in length.  No complications were encountered.  The sponge was removed.  The fascia was closed along with the peritoneum with 2 interrupted figure-of-eight sutures of #1 PDS.  Subcutaneous tissue was closed with 3-0 Vicryl. Skin was reapproximated with 3-0 plain catgut.  The patient seemed to tolerate the procedure well and was returned to recovery in satisfactory condition.  Blood loss about 10 mL.     Malachi Pro. Ambrose Mantle,  M.D.     TFH/MEDQ  D:  04/13/2013  T:  04/14/2013  Job:  [id number]

## 2013-04-14 NOTE — Progress Notes (Signed)
Patient ID: [name] Costa, female   DOB: Apr 14, 1981, 32 y.o.   MRN: [id number] #2 afebrile doing well for d/c

## 2013-04-14 NOTE — Anesthesia Postprocedure Evaluation (Signed)
  Anesthesia Post-op Note  Patient: [name] Costa  Procedure(s) Performed: Procedure(s): POST PARTUM TUBAL LIGATION (Bilateral)  Patient Location: Mother/Baby  Anesthesia Type:Epidural  Level of Consciousness: awake, alert  and oriented  Airway and Oxygen Therapy: Patient Spontanous Breathing  Post-op Pain: mild  Post-op Assessment: Post-op Vital signs reviewed and Patient's Cardiovascular Status Stable  Post-op Vital Signs: Reviewed and stable  Complications: No apparent anesthesia complications

## 2013-04-15 NOTE — Discharge Summary (Signed)
NAMECHRISTNE, [name] Costa NO.:  [id number]  MEDICAL RECORD NO.:  [id number]  LOCATION:  9111                          FACILITY:  [facility]  PHYSICIAN:  Malachi Pro. Ambrose Mantle, M.D. DATE OF BIRTH:  1981-01-06  DATE OF ADMISSION:  04/12/2013 DATE OF DISCHARGE:  04/14/2013                              DISCHARGE SUMMARY   This is a 32 year old black female, para 1-0-3-1, gravida 5, EDC Apr 16, 2013, admitted with rupture of membranes and uterine contractions. There were no abnormal labs during her pregnancy.  She was offered induction at 39 weeks and declined, but had spontaneous rupture of membranes at 9 a.m. on the day of admission.  On admission to the hospital, the cervix was 3-4 cm, 70%, vertex at a -3.  The patient requested and received an epidural.  By 4:58 p.m., she was 8-9 cm.  She progressed to full dilatation, but the vertex was directly OP.  Finally, the patient delivered a living female infant spontaneously OP over an intact perineum with a left labial laceration.  Apgars of the infant were 8 and 9 at one and five minutes.  Postpartum, the patient did well. She requested tubal ligation.  On the first postpartum day, she underwent a Pomeroy tubal ligation by Dr. Ambrose Mantle.  Procedure was done under epidural anesthesia.  Minimal blood loss.  On the second postpartum and 1st postop day, she was doing well, afebrile.  Blood count was stable and she was ready for discharge.  Initial hemoglobin 10.6, hematocrit 33.1, white count 16,000, platelet count 252,000.  RPR nonreactive.  Nasal swabs were negative for MRSA and staph aureus.  On first postpartum day, hemoglobin was 10, second postpartum day 9.4.  FINAL DIAGNOSES:  Intrauterine pregnancy at 39+ weeks, delivered OP, voluntary sterilization.  OPERATIONS:  Spontaneous delivery OP.  Bilateral tubal ligation.  FINAL CONDITION:  Improved.  INSTRUCTIONS:  Include our regular discharge instruction booklet.  The patient is  given a prescription for Motrin 600 mg, 30 tablets and Percocet 5/325, 30 tablets, 1 of each every 6 hours as needed for pain. She is asked to return to the office in 2 weeks for followup examination.     Malachi Pro. Ambrose Mantle, M.D.     TFH/MEDQ  D:  04/14/2013  T:  04/15/2013  Job:  [id number]

## 2014-10-02 ENCOUNTER — Encounter (HOSPITAL_COMMUNITY): Payer: Self-pay | Admitting: Obstetrics and Gynecology

## 2021-02-05 ENCOUNTER — Ambulatory Visit (HOSPITAL_COMMUNITY)
Admission: EM | Admit: 2021-02-05 | Discharge: 2021-02-05 | Disposition: A | Discharge status: Home / Self Care | Payer: BC Managed Care – PPO | Attending: Emergency Medicine | Admitting: Emergency Medicine

## 2021-02-05 ENCOUNTER — Encounter (HOSPITAL_COMMUNITY): Payer: Self-pay | Admitting: Emergency Medicine

## 2021-02-05 ENCOUNTER — Other Ambulatory Visit: Payer: Self-pay

## 2021-02-05 DIAGNOSIS — R220 Localized swelling, mass and lump, head: Secondary | ICD-10-CM

## 2021-02-05 DIAGNOSIS — J020 Streptococcal pharyngitis: Secondary | ICD-10-CM | POA: Diagnosis not present

## 2021-02-05 DIAGNOSIS — J029 Acute pharyngitis, unspecified: Secondary | ICD-10-CM

## 2021-02-05 DIAGNOSIS — R030 Elevated blood-pressure reading, without diagnosis of hypertension: Secondary | ICD-10-CM

## 2021-02-05 LAB — POCT RAPID STREP A, ED / UC: Streptococcus, Group A Screen (Direct): POSITIVE — AB

## 2021-02-05 MED ORDER — PENICILLIN G BENZATHINE 1200000 UNIT/2ML IM SUSP
1.2000 10*6.[IU] | Freq: Once | INTRAMUSCULAR | Status: AC
  Administered 2021-02-05: 1.2 10*6.[IU] via INTRAMUSCULAR

## 2021-02-05 MED ORDER — PENICILLIN G BENZATHINE 1200000 UNIT/2ML IM SUSP
INTRAMUSCULAR | Status: AC
  Filled 2021-02-05: qty 2

## 2021-02-05 NOTE — ED Triage Notes (Signed)
Patient c/o sore throat x 4 days.   Patient c/o lip swelling that started this morning.   Patient denies fever at home.   Patient endorses difficulty swallowing and difficulty breathing at night.   Patient states " I haven't really been able to eat because I can't swallow, the only thing that feels good is ice".   Patient took Benadryl and Tylenol with no relief of symptoms.

## 2021-02-05 NOTE — Discharge Instructions (Signed)
You were given an injection of a long-acting penicillin today to treat your strep throat.  No additional antibiotic treatment is needed.  Take Tylenol or ibuprofen as needed for discomfort.    Follow up with your primary care provider if your symptoms are not improving.    Your blood pressure is elevated today at 146/101.  Please have this rechecked by your primary care provider in 2-4 weeks.

## 2021-02-05 NOTE — ED Provider Notes (Signed)
[facility]    CSN: [id number] Arrival date & time: 02/05/21  0805      History   Chief Complaint Chief Complaint  Patient presents with  . Sore Throat  . Oral Swelling    HPI [name] Costa is a 40 y.o. female.   Patient presents with 4 to 5-day history of sore throat.  She states it is painful to swallow.  She also reports very mild swelling at the corner of her lower lip this morning.  She denies fever, rash, cough, shortness of breath, vomiting, diarrhea, or other symptoms.  Treatment attempted at home with Tylenol and Benadryl.  She denies pertinent medical history.  The history is provided by the patient and medical records.    Past Medical History:  Diagnosis Date  . Bladder infection   . Trichomonas     Patient Active Problem List   Diagnosis Date Noted  . TOBACCO ABUSE 10/15/2009  . ALLERGIC RHINITIS 10/15/2009  . ASTHMA 10/15/2009    Past Surgical History:  Procedure Laterality Date  . CHOLECYSTECTOMY    . CHOLECYSTECTOMY    . TUBAL LIGATION Bilateral 04/13/2013   Procedure: POST PARTUM TUBAL LIGATION;  Surgeon: Bing Plume, MD;  Location: [facility];  Service: Gynecology;  Laterality: Bilateral;    OB History    Gravida  5   Para  2   Term  2   Preterm      AB  3   Living  2     SAB  1   IAB  2   Ectopic      Multiple      Live Births  1            Home Medications    Prior to Admission medications   Medication Sig Start Date End Date Taking? Authorizing Provider  ibuprofen (ADVIL,MOTRIN) 600 MG tablet Take 1 tablet (600 mg total) by mouth every 6 (six) hours as needed for pain. 04/14/13   Tracey Harries, MD  oxyCODONE-acetaminophen (PERCOCET/ROXICET) 5-325 MG per tablet Take 1 tablet by mouth every 6 (six) hours as needed. 04/14/13   Tracey Harries, MD    Family History History reviewed. No pertinent family history.  Social History Social History   Tobacco Use  . Smoking status: Never Smoker  . Smokeless  tobacco: Never Used  Substance Use Topics  . Alcohol use: No  . Drug use: No     Allergies   Latex   Review of Systems Review of Systems  Constitutional: Negative for chills and fever.  HENT: Positive for sore throat and trouble swallowing. Negative for ear pain.   Eyes: Negative for pain and visual disturbance.  Respiratory: Negative for cough and shortness of breath.   Cardiovascular: Negative for chest pain and palpitations.  Gastrointestinal: Negative for abdominal pain, diarrhea and vomiting.  Genitourinary: Negative for dysuria and hematuria.  Musculoskeletal: Negative for arthralgias and back pain.  Skin: Negative for color change and rash.  Neurological: Negative for seizures and syncope.  All other systems reviewed and are negative.    Physical Exam Triage Vital Signs ED Triage Vitals  Enc Vitals Group     BP 02/05/21 0827 (!) 146/101     Pulse Rate 02/05/21 0827 64     Resp 02/05/21 0827 14     Temp 02/05/21 0827 98.1 F (36.7 C)     Temp src --      SpO2 02/05/21 0827 100 %  Weight --      Height --      Head Circumference --      Peak Flow --      Pain Score 02/05/21 0825 7     Pain Loc --      Pain Edu? --      Excl. in GC? --    No data found.  Updated Vital Signs BP (!) 146/101 (BP Location: Right Arm)   Pulse 64   Temp 98.1 F (36.7 C)   Resp 14   LMP 01/25/2021   SpO2 100%   Visual Acuity Right Eye Distance:   Left Eye Distance:   Bilateral Distance:    Right Eye Near:   Left Eye Near:    Bilateral Near:     Physical Exam Vitals and nursing note reviewed.  Constitutional:      General: She is not in acute distress.    Appearance: She is well-developed and well-nourished. She is not ill-appearing.  HENT:     Head: Normocephalic and atraumatic.     Right Ear: Tympanic membrane normal.     Left Ear: Tympanic membrane normal.     Nose: Nose normal.     Mouth/Throat:     Mouth: Mucous membranes are moist.     Pharynx: Uvula  midline. Oropharyngeal exudate and posterior oropharyngeal erythema present.     Tonsils: 2+ on the right. 2+ on the left.  Eyes:     Conjunctiva/sclera: Conjunctivae normal.  Cardiovascular:     Rate and Rhythm: Normal rate and regular rhythm.     Heart sounds: Normal heart sounds.  Pulmonary:     Effort: Pulmonary effort is normal. No respiratory distress.     Breath sounds: Normal breath sounds.  Abdominal:     Palpations: Abdomen is soft.     Tenderness: There is no abdominal tenderness.  Musculoskeletal:        General: No edema.     Cervical back: Neck supple.  Skin:    General: Skin is warm and dry.  Neurological:     General: No focal deficit present.     Mental Status: She is alert and oriented to person, place, and time.     Gait: Gait normal.  Psychiatric:        Mood and Affect: Mood and affect and mood normal.        Behavior: Behavior normal.      UC Treatments / Results  Labs (all labs ordered are listed, but only abnormal results are displayed) Labs Reviewed  POCT RAPID STREP A, ED / UC - Abnormal; Notable for the following components:      Result Value   Streptococcus, Group A Screen (Direct) POSITIVE (*)    All other components within normal limits    EKG   Radiology No results found.  Procedures Procedures (including critical care time)  Medications Ordered in UC Medications  penicillin g benzathine (BICILLIN LA) 1200000 UNIT/2ML injection 1.2 Million Units (1.2 Million Units Intramuscular Given 02/05/21 0923)    Initial Impression / Assessment and Plan / UC Course  I have reviewed the triage vital signs and the nursing notes.  Pertinent labs & imaging results that were available during my care of the patient were reviewed by me and considered in my medical decision making (see chart for details).   Strep pharyngitis.  Elevated blood pressure reading.  Treating with Bicillin LA.  Instructed patient to take Tylenol or ibuprofen as needed for  discomfort and to follow-up with her PCP if her symptoms are not improving.  Discussed that her blood pressure is elevated today needs to be rechecked by her PCP in 2 to 4 weeks.  She agrees to plan of care.   Final Clinical Impressions(s) / UC Diagnoses   Final diagnoses:  Strep pharyngitis  Elevated blood pressure reading     Discharge Instructions     You were given an injection of a long-acting penicillin today to treat your strep throat.  No additional antibiotic treatment is needed.  Take Tylenol or ibuprofen as needed for discomfort.    Follow up with your primary care provider if your symptoms are not improving.    Your blood pressure is elevated today at 146/101.  Please have this rechecked by your primary care provider in 2-4 weeks.              ED Prescriptions    None     PDMP not reviewed this encounter.   Mickie Bail, NP 02/05/21 [phone]

## 2021-04-30 ENCOUNTER — Other Ambulatory Visit: Payer: Self-pay | Admitting: Family Medicine

## 2021-04-30 DIAGNOSIS — N926 Irregular menstruation, unspecified: Secondary | ICD-10-CM

## 2021-05-13 ENCOUNTER — Ambulatory Visit
Admission: RE | Admit: 2021-05-13 | Discharge: 2021-05-13 | Disposition: A | Discharge status: Home / Self Care | Payer: BC Managed Care – PPO | Source: Ambulatory Visit | Attending: Family Medicine | Admitting: Family Medicine

## 2021-05-13 DIAGNOSIS — N926 Irregular menstruation, unspecified: Secondary | ICD-10-CM

## 2021-06-04 ENCOUNTER — Other Ambulatory Visit: Payer: Self-pay

## 2021-06-04 ENCOUNTER — Emergency Department (HOSPITAL_COMMUNITY): Payer: BC Managed Care – PPO

## 2021-06-04 ENCOUNTER — Encounter (HOSPITAL_COMMUNITY): Payer: BC Managed Care – PPO

## 2021-06-04 ENCOUNTER — Inpatient Hospital Stay (HOSPITAL_COMMUNITY): Payer: BC Managed Care – PPO

## 2021-06-04 ENCOUNTER — Inpatient Hospital Stay (HOSPITAL_COMMUNITY)
Admission: EM | Admit: 2021-06-04 | Discharge: 2021-06-07 | DRG: 062 | Disposition: A | Discharge status: Rehabilitation Facility | Payer: BC Managed Care – PPO | Attending: Neurology | Admitting: Neurology

## 2021-06-04 ENCOUNTER — Encounter (HOSPITAL_COMMUNITY): Payer: Self-pay | Admitting: Emergency Medicine

## 2021-06-04 DIAGNOSIS — I10 Essential (primary) hypertension: Secondary | ICD-10-CM | POA: Diagnosis present

## 2021-06-04 DIAGNOSIS — Z9851 Tubal ligation status: Secondary | ICD-10-CM

## 2021-06-04 DIAGNOSIS — Z823 Family history of stroke: Secondary | ICD-10-CM

## 2021-06-04 DIAGNOSIS — F1210 Cannabis abuse, uncomplicated: Secondary | ICD-10-CM | POA: Diagnosis present

## 2021-06-04 DIAGNOSIS — Z91040 Latex allergy status: Secondary | ICD-10-CM

## 2021-06-04 DIAGNOSIS — E785 Hyperlipidemia, unspecified: Secondary | ICD-10-CM | POA: Diagnosis present

## 2021-06-04 DIAGNOSIS — I639 Cerebral infarction, unspecified: Secondary | ICD-10-CM

## 2021-06-04 DIAGNOSIS — I69354 Hemiplegia and hemiparesis following cerebral infarction affecting left non-dominant side: Secondary | ICD-10-CM | POA: Diagnosis not present

## 2021-06-04 DIAGNOSIS — W010XXA Fall on same level from slipping, tripping and stumbling without subsequent striking against object, initial encounter: Secondary | ICD-10-CM | POA: Diagnosis present

## 2021-06-04 DIAGNOSIS — I6381 Other cerebral infarction due to occlusion or stenosis of small artery: Secondary | ICD-10-CM | POA: Diagnosis not present

## 2021-06-04 DIAGNOSIS — Z7902 Long term (current) use of antithrombotics/antiplatelets: Secondary | ICD-10-CM

## 2021-06-04 DIAGNOSIS — Q211 Atrial septal defect: Secondary | ICD-10-CM

## 2021-06-04 DIAGNOSIS — Y9301 Activity, walking, marching and hiking: Secondary | ICD-10-CM | POA: Diagnosis present

## 2021-06-04 DIAGNOSIS — R29706 NIHSS score 6: Secondary | ICD-10-CM | POA: Diagnosis present

## 2021-06-04 DIAGNOSIS — E282 Polycystic ovarian syndrome: Secondary | ICD-10-CM | POA: Diagnosis present

## 2021-06-04 DIAGNOSIS — F419 Anxiety disorder, unspecified: Secondary | ICD-10-CM | POA: Diagnosis present

## 2021-06-04 DIAGNOSIS — R471 Dysarthria and anarthria: Secondary | ICD-10-CM | POA: Diagnosis present

## 2021-06-04 DIAGNOSIS — R29810 Facial weakness: Secondary | ICD-10-CM | POA: Diagnosis present

## 2021-06-04 DIAGNOSIS — Z7982 Long term (current) use of aspirin: Secondary | ICD-10-CM

## 2021-06-04 DIAGNOSIS — M899 Disorder of bone, unspecified: Secondary | ICD-10-CM | POA: Diagnosis present

## 2021-06-04 DIAGNOSIS — R4182 Altered mental status, unspecified: Secondary | ICD-10-CM | POA: Diagnosis not present

## 2021-06-04 DIAGNOSIS — J45909 Unspecified asthma, uncomplicated: Secondary | ICD-10-CM | POA: Diagnosis present

## 2021-06-04 DIAGNOSIS — Z20822 Contact with and (suspected) exposure to covid-19: Secondary | ICD-10-CM | POA: Diagnosis present

## 2021-06-04 DIAGNOSIS — I6389 Other cerebral infarction: Secondary | ICD-10-CM | POA: Diagnosis not present

## 2021-06-04 DIAGNOSIS — F17200 Nicotine dependence, unspecified, uncomplicated: Secondary | ICD-10-CM | POA: Diagnosis present

## 2021-06-04 DIAGNOSIS — G8194 Hemiplegia, unspecified affecting left nondominant side: Secondary | ICD-10-CM | POA: Diagnosis present

## 2021-06-04 DIAGNOSIS — Z9282 Status post administration of tPA (rtPA) in a different facility within the last 24 hours prior to admission to current facility: Secondary | ICD-10-CM | POA: Diagnosis not present

## 2021-06-04 DIAGNOSIS — E7800 Pure hypercholesterolemia, unspecified: Secondary | ICD-10-CM | POA: Diagnosis not present

## 2021-06-04 DIAGNOSIS — K219 Gastro-esophageal reflux disease without esophagitis: Secondary | ICD-10-CM | POA: Diagnosis present

## 2021-06-04 DIAGNOSIS — I6300 Cerebral infarction due to thrombosis of unspecified precerebral artery: Secondary | ICD-10-CM | POA: Diagnosis not present

## 2021-06-04 HISTORY — DX: Cerebral infarction, unspecified: I63.9

## 2021-06-04 LAB — COMPREHENSIVE METABOLIC PANEL
ALT: 10 U/L (ref 0–44)
AST: 15 U/L (ref 15–41)
Albumin: 3 g/dL — ABNORMAL LOW (ref 3.5–5.0)
Alkaline Phosphatase: 70 U/L (ref 38–126)
Anion gap: 9 (ref 5–15)
BUN: 10 mg/dL (ref 6–20)
CO2: 21 mmol/L — ABNORMAL LOW (ref 22–32)
Calcium: 8.5 mg/dL — ABNORMAL LOW (ref 8.9–10.3)
Chloride: 106 mmol/L (ref 98–111)
Creatinine, Ser: 0.74 mg/dL (ref 0.44–1.00)
GFR, Estimated: >60 mL/min (ref >60)
Glucose, Bld: 112 mg/dL — ABNORMAL HIGH (ref 70–99)
Potassium: 3.6 mmol/L (ref 3.5–5.1)
Sodium: 136 mmol/L (ref 135–145)
Total Bilirubin: 0.5 mg/dL (ref 0.3–1.2)
Total Protein: 6 g/dL — ABNORMAL LOW (ref 6.5–8.1)

## 2021-06-04 LAB — DIFFERENTIAL
Abs Immature Granulocytes: 0.07 10*3/uL (ref 0.00–0.07)
Basophils Absolute: 0 10*3/uL (ref 0.0–0.1)
Basophils Relative: 0 %
Eosinophils Absolute: 0.2 10*3/uL (ref 0.0–0.5)
Eosinophils Relative: 2 %
Immature Granulocytes: 1 %
Lymphocytes Relative: 38 %
Lymphs Abs: 4.2 10*3/uL — ABNORMAL HIGH (ref 0.7–4.0)
Monocytes Absolute: 0.5 10*3/uL (ref 0.1–1.0)
Monocytes Relative: 5 %
Neutro Abs: 6.1 10*3/uL (ref 1.7–7.7)
Neutrophils Relative %: 54 %

## 2021-06-04 LAB — RESP PANEL BY RT-PCR (FLU A&B, COVID) ARPGX2
Influenza A by PCR: NEGATIVE
Influenza B by PCR: NEGATIVE
SARS Coronavirus 2 by RT PCR: NEGATIVE

## 2021-06-04 LAB — RAPID URINE DRUG SCREEN, HOSP PERFORMED
Amphetamines: NOT DETECTED
Barbiturates: NOT DETECTED
Benzodiazepines: NOT DETECTED
Cocaine: NOT DETECTED
Opiates: NOT DETECTED
Tetrahydrocannabinol: POSITIVE — AB

## 2021-06-04 LAB — I-STAT BETA HCG BLOOD, ED (MC, WL, AP ONLY): I-stat hCG, quantitative: <5 m[IU]/mL (ref <5)

## 2021-06-04 LAB — I-STAT CHEM 8, ED
BUN: 11 mg/dL (ref 6–20)
Calcium, Ion: 1.08 mmol/L — ABNORMAL LOW (ref 1.15–1.40)
Chloride: 106 mmol/L (ref 98–111)
Creatinine, Ser: 0.7 mg/dL (ref 0.44–1.00)
Glucose, Bld: 106 mg/dL — ABNORMAL HIGH (ref 70–99)
HCT: 34 % — ABNORMAL LOW (ref 36.0–46.0)
Hemoglobin: 11.6 g/dL — ABNORMAL LOW (ref 12.0–15.0)
Potassium: 3.5 mmol/L (ref 3.5–5.1)
Sodium: 138 mmol/L (ref 135–145)
TCO2: 21 mmol/L — ABNORMAL LOW (ref 22–32)

## 2021-06-04 LAB — PROTIME-INR
INR: 1 (ref 0.8–1.2)
Prothrombin Time: 13.2 seconds (ref 11.4–15.2)

## 2021-06-04 LAB — CBC
HCT: 34.4 % — ABNORMAL LOW (ref 36.0–46.0)
Hemoglobin: 10.5 g/dL — ABNORMAL LOW (ref 12.0–15.0)
MCH: 24.8 pg — ABNORMAL LOW (ref 26.0–34.0)
MCHC: 30.5 g/dL (ref 30.0–36.0)
MCV: 81.3 fL (ref 80.0–100.0)
Platelets: 248 10*3/uL (ref 150–400)
RBC: 4.23 MIL/uL (ref 3.87–5.11)
RDW: 18.3 % — ABNORMAL HIGH (ref 11.5–15.5)
WBC: 11.1 10*3/uL — ABNORMAL HIGH (ref 4.0–10.5)
nRBC: 0 % (ref 0.0–0.2)

## 2021-06-04 LAB — CBG MONITORING, ED: Glucose-Capillary: 112 mg/dL — ABNORMAL HIGH (ref 70–99)

## 2021-06-04 LAB — ECHOCARDIOGRAM COMPLETE BUBBLE STUDY
Area-P 1/2: 4.04 cm2
Calc EF: 62.6 %
S' Lateral: 3.3 cm
Single Plane A2C EF: 62.9 %
Single Plane A4C EF: 64 %

## 2021-06-04 LAB — HIV ANTIBODY (ROUTINE TESTING W REFLEX): HIV Screen 4th Generation wRfx: NONREACTIVE

## 2021-06-04 LAB — APTT: aPTT: 34 seconds (ref 24–36)

## 2021-06-04 LAB — SEDIMENTATION RATE: Sed Rate: 18 mm/hr (ref 0–22)

## 2021-06-04 LAB — MRSA NEXT GEN BY PCR, NASAL: MRSA by PCR Next Gen: NOT DETECTED

## 2021-06-04 MED ORDER — LORAZEPAM 2 MG/ML IJ SOLN
1.0000 mg | Freq: Once | INTRAMUSCULAR | Status: AC
  Administered 2021-06-04: 1 mg via INTRAVENOUS

## 2021-06-04 MED ORDER — PANTOPRAZOLE SODIUM 40 MG IV SOLR
40.0000 mg | Freq: Every day | INTRAVENOUS | Status: DC
  Administered 2021-06-04 – 2021-06-06 (×3): 40 mg via INTRAVENOUS
  Filled 2021-06-04 (×4): qty 40

## 2021-06-04 MED ORDER — HYDRALAZINE HCL 20 MG/ML IJ SOLN
10.0000 mg | Freq: Once | INTRAMUSCULAR | Status: AC
  Administered 2021-06-04: 10 mg via INTRAVENOUS

## 2021-06-04 MED ORDER — SODIUM CHLORIDE 0.9 % IV SOLN: INTRAVENOUS | Status: DC

## 2021-06-04 MED ORDER — ONDANSETRON HCL 4 MG/2ML IJ SOLN
INTRAMUSCULAR | Status: AC
  Administered 2021-06-04: 4 mg via INTRAVENOUS
  Filled 2021-06-04: qty 2

## 2021-06-04 MED ORDER — CLEVIDIPINE BUTYRATE 0.5 MG/ML IV EMUL
0.0000 mg/h | INTRAVENOUS | Status: DC
Start: 2021-06-04 — End: 2021-06-04
  Administered 2021-06-04: 21 mg/h via INTRAVENOUS
  Administered 2021-06-04: 1 mg/h via INTRAVENOUS

## 2021-06-04 MED ORDER — STROKE: EARLY STAGES OF RECOVERY BOOK
Freq: Once | Status: AC
  Administered 2021-06-07: 1
  Filled 2021-06-04: qty 1

## 2021-06-04 MED ORDER — LORAZEPAM 2 MG/ML IJ SOLN
INTRAMUSCULAR | Status: AC
  Administered 2021-06-04: 1 mg
  Filled 2021-06-04: qty 1

## 2021-06-04 MED ORDER — CLEVIDIPINE BUTYRATE 0.5 MG/ML IV EMUL
0.0000 mg/h | INTRAVENOUS | Status: DC
  Administered 2021-06-04: 21 mg/h via INTRAVENOUS
  Administered 2021-06-04 – 2021-06-05 (×2): 2 mg/h via INTRAVENOUS
  Filled 2021-06-04 (×3): qty 50

## 2021-06-04 MED ORDER — LORAZEPAM 2 MG/ML IJ SOLN
1.0000 mg | Freq: Once | INTRAMUSCULAR | Status: AC
  Administered 2021-06-04: 1 mg via INTRAVENOUS
  Filled 2021-06-04: qty 1

## 2021-06-04 MED ORDER — LABETALOL HCL 5 MG/ML IV SOLN
20.0000 mg | Freq: Once | INTRAVENOUS | Status: AC
  Administered 2021-06-04: 20 mg via INTRAVENOUS

## 2021-06-04 MED ORDER — ONDANSETRON HCL 4 MG/2ML IJ SOLN: 4.0000 mg | Freq: Three times a day (TID) | INTRAMUSCULAR | Status: DC | PRN

## 2021-06-04 MED ORDER — LORAZEPAM 2 MG/ML IJ SOLN
INTRAMUSCULAR | Status: AC
  Filled 2021-06-04: qty 1

## 2021-06-04 MED ORDER — ACETAMINOPHEN 650 MG RE SUPP: 650.0000 mg | RECTAL | Status: DC | PRN

## 2021-06-04 MED ORDER — ALTEPLASE (STROKE) FULL DOSE INFUSION
90.0000 mg | Freq: Once | INTRAVENOUS | Status: AC
  Administered 2021-06-04: 90 mg via INTRAVENOUS
  Filled 2021-06-04: qty 100

## 2021-06-04 MED ORDER — ACETAMINOPHEN 325 MG PO TABS: 650.0000 mg | ORAL_TABLET | ORAL | Status: DC | PRN

## 2021-06-04 MED ORDER — ACETAMINOPHEN 160 MG/5ML PO SOLN: 650.0000 mg | ORAL | Status: DC | PRN

## 2021-06-04 MED ORDER — SENNOSIDES-DOCUSATE SODIUM 8.6-50 MG PO TABS: 1.0000 | ORAL_TABLET | Freq: Every evening | ORAL | Status: DC | PRN

## 2021-06-04 MED ORDER — SODIUM CHLORIDE 0.9 % IV SOLN
50.0000 mL | Freq: Once | INTRAVENOUS | Status: AC
  Administered 2021-06-04: 50 mL via INTRAVENOUS

## 2021-06-04 MED ORDER — CHLORHEXIDINE GLUCONATE CLOTH 2 % EX PADS
6.0000 | MEDICATED_PAD | Freq: Every day | CUTANEOUS | Status: DC
  Administered 2021-06-04 – 2021-06-05 (×2): 6 via TOPICAL

## 2021-06-04 MED ORDER — SODIUM CHLORIDE 0.9% FLUSH: 3.0000 mL | Freq: Once | INTRAVENOUS | Status: DC

## 2021-06-04 MED ORDER — IOHEXOL 350 MG/ML SOLN
75.0000 mL | Freq: Once | INTRAVENOUS | Status: AC | PRN
  Administered 2021-06-04: 75 mL via INTRAVENOUS

## 2021-06-04 NOTE — ED Notes (Signed)
Returned to room from MRI

## 2021-06-04 NOTE — ED Notes (Signed)
Transported to MRI from CT scan.

## 2021-06-04 NOTE — Progress Notes (Signed)
Bilateral lower extremity venous duplex has been completed. Preliminary results can be found in CV Proc through chart review.   06/04/21 4:55 PM Olen Cordial RVT

## 2021-06-04 NOTE — ED Notes (Signed)
Patient currently at MRI

## 2021-06-04 NOTE — ED Notes (Signed)
Patient speech becoming more slurred at this time, patient also endorses increased arm weakness.

## 2021-06-04 NOTE — ED Notes (Signed)
Transported to CT with neurologist/RN .

## 2021-06-04 NOTE — Progress Notes (Signed)
Pharmacist Code Stroke Response  Notified to mix tPA at 6:00 by Dr. Derry Lory Delivered tPA to RN at 6:05  tPA dose = 9 mg bolus over 1 minute followed by 81 mg for a total dose of 90 mg over 1 hour  Issues/delays encountered (if applicable):  Blood pressure elevated s/p MRI, required labetalol and clevidipine  Eddie Candle 06/04/21 6:36 AM

## 2021-06-04 NOTE — Progress Notes (Signed)
  Echocardiogram 2D Echocardiogram has been performed.  Stark Bray Swaim 06/04/2021, 8:59 AM

## 2021-06-04 NOTE — Progress Notes (Addendum)
STROKE TEAM PROGRESS NOTE   SUBJECTIVE (INTERVAL HISTORY) Ms. Henion has recently been moved to [facility]. Her daughter and brother are at the bedside. She endorses a recent diagnosis of anemia 2/2 ongoing vaginal bleeding for which she has been seen by a provider. She does have PCOS and HBP, without medical intervention. She has had one miscarriage at 12 weeks, family history of stroke (mother, elementary school age), no rashes, illicit drug use is limited to marijuana. On my visit she is visibly emotional, nauseous.  MRI shows diffusion positive subcu changes in the right periventricular region corona radiata without corresponding hyperintensity on the FLAIR images.  CT angiogram shows no significant large vessel extra or intracranial stenosis  OBJECTIVE Vitals:   06/04/21 0805 06/04/21 0815 06/04/21 0830 06/04/21 0845  BP: (!) 161/91 (!) 146/86 (!) 168/97 (!) 141/94  Pulse: 97 (!) 101 82 83  Resp: 15 20 19 16   Temp: 98 F (36.7 C) 98 F (36.7 C) 98 F (36.7 C) 98 F (36.7 C)  TempSrc:      SpO2: 97% 97% 96% 100%  Weight:      Height:        CBC:  Recent Labs  Lab 06/04/21 0540 06/04/21 0555  WBC 11.1*  --   NEUTROABS 6.1  --   HGB 10.5* 11.6*  HCT 34.4* 34.0*  MCV 81.3  --   PLT 248  --     Basic Metabolic Panel:  Recent Labs  Lab 06/04/21 0540 06/04/21 0555  NA 136 138  K 3.6 3.5  CL 106 106  CO2 21*  --   GLUCOSE 112* 106*  BUN 10 11  CREATININE 0.74 0.70  CALCIUM 8.5*  --     Lipid Panel: No results for input(s): CHOL, TRIG, HDL, CHOLHDL, VLDL, LDLCALC in the last 168 hours. HgbA1c: No results found for: HGBA1C Urine Drug Screen: No results found for: LABOPIA, COCAINSCRNUR, LABBENZ, AMPHETMU, THCU, LABBARB  Alcohol Level No results found for: [facility]  IMAGING  Results for orders placed or performed during the hospital encounter of 06/04/21  MR BRAIN WO CONTRAST   Narrative   CLINICAL DATA:  Left facial droop  EXAM: MRI HEAD WITHOUT  CONTRAST  TECHNIQUE: Multiplanar, multiecho pulse sequences of the brain and surrounding structures were obtained without intravenous contrast.  COMPARISON:  Head CT and CTA from earlier today  FINDINGS: Brain: Truncated study in the emergent setting. There is a wedge of mildly restricted diffusion in the right lateral lenticulostriate distribution spanning the stratum and corona radiata. No associated FLAIR changes. No chronic ischemia, atrophy, or hydrocephalus. 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  IMPRESSION: Mildly restricted diffusion without FLAIR changes with a right lateral lenticulostriate distribution.   Electronically Signed   By: 08/05/21 M.D.   On: 06/04/2021 06:04   CT HEAD WO CONTRAST   Narrative   CLINICAL DATA:  Stroke follow-up.  Change in mental status after tPA  EXAM: CT HEAD WITHOUT CONTRAST  TECHNIQUE: Contiguous axial images were obtained from the base of the skull through the vertex without intravenous contrast.  COMPARISON:  Head CT from earlier today  FINDINGS: Brain: No hemorrhage, hydrocephalus, or swelling. Subtle areas of low-density at the right caudate and posterior putamen, areas of restricted diffusion by prior MRI.  Vascular: No hyperdense vessel.  Skull: Normal.  Negative for fracture or focal lesion.  Sinuses/Orbits: No acute finding.  IMPRESSION: No developments since prior MRI.  No intracranial hemorrhage.   Electronically Signed   By: Marnee Spring M.D.   On: 06/04/2021 09:13     PHYSICAL EXAM  General exam   Pleasant obese young African-American lady who appears quite tearful and emotional  HEENT-  Normocephalic, no lesions, without obvious abnormality.  Normal external eye and  conjunctiva.   Cardiovascular- RRR, on telemetry Lungs-breathing comfortably on room air Abdomen- soft Musculoskeletal-no obvious joint tenderness, deformity or swelling Skin-warm and dry, no hyperpigmentation, vitiligo, or suspicious lesions  Neurologic Exam: General: Emotionally distressed Mental Status: Alert, oriented, thought content appropriate.  Speech mildly dysarthric without evidence of aphasia.  Able to follow 3 step commands without difficulty. Cranial Nerves: II:  Visual fields grossly normal, PERRL III,IV, VI: ptosis not present, extra-ocular motions intact bilaterally V,VII: Moderate left lower facial weakness.  T, light touch sensation intact bilaterally VIII: hearing intact bilaterally XII: tongue extension deviated to the left, without atrophy or fasciculations  Motor: Right : Upper extremity   5/5    Left:     Upper extremity   1/5  Lower extremity   5/5     Lower extremity   1/5 hip, 2/5 ankle Tone and bulk:normal tone throughout; no atrophy noted Sensory: Light touch intact throughout, bilaterally Gait: Deferred   ASSESSMENT/PLAN Ms. Jkayla L Mukherjee is a 40 y.o. female with history of anxiety and high blood pressure who presented to [facility] this morning with complaints of left-sided weakness and left facial droop. MR brain showed mildly restricted diffusion without FLAIR changes with a right lateral lenticulostriate distribution. Ms. Stivers was LKW 6 hours prior to arrival and was provided TPA due to a DWI/FLAIR mismatch and after a risk/benefit conversation with the patient.    # Mildly restricted diffusion without FLAIR changes with a right lateral lenticulostriate distribution.  Etiology likely cryptogenic as it appears to be quite large for lacunar infarct Unclear etiology at this time. Known risk factors at this time include uncontrolled HBP, PCOS but isn't on hormonal treatment. Will continue with stroke workup. 2D Echo pending LDL pending - add statin if  LDL >70 HgbA1c pending UDS pending Awaiting SLP for swallow study for VTE prophylaxis No antithrombotic prior to admission; start DAPT tomorrow x 90 days, aspirin 81mg  thereafter Therapy recommendations: PT/OT/SLP  Hypertension Currently on cleviprex. Switch to norvasc when cleared for PO Permissive hypertension (OK if < 220/120) but gradually normalize in 5-7 days Long-term BP goal normotensive  Hospital day # 0   , PhD, PA-C Stroke  [phone]  I have personally obtained history,examined this patient, reviewed notes, independently viewed imaging studies, participated in medical decision making and plan of care.ROS completed by me personally and pertinent positives fully documented  I have made any additions or clarifications directly to the above note. Agree with note above.  Patient presented with sudden onset of facial weakness and left hemiplegia likely due to large right subcortical infarct even though she presented outside typical time window for IV tPA MRI scan showed diffusion/FLAIR mismatch and tPA was considered after careful discussion of risk-benefit with patient.  Post tPA CT scan shows no evidence of hemorrhage.  Patient still has significant left hemiparesis and dysarthria.  Continue close neurological monitoring and strict blood pressure control as per post tPA protocol.  Mobilize up in bed.  Physical occupational and speech therapy consults.  Continue ongoing stroke work-up.  Check urine  drug screen, lower extremity venous Dopplers for DVT, TCD bubble study for PFO.  May need TEE later.  Aspirin when she is able to swallow.  Check CT scan of the head 24 hours post tPA.  Long discussion with the patient and daughter at the bedside and answered questions.This patient is critically ill and at significant risk of neurological worsening, death and care requires constant monitoring of vital signs, hemodynamics,respiratory and cardiac monitoring, extensive review of  multiple databases, frequent neurological assessment, discussion with family, other specialists and medical decision making of high complexity.I have made any additions or clarifications directly to the above note.This critical care time does not reflect procedure time, or teaching time or supervisory time of PA/NP/Med Resident etc but could involve care discussion time.  I spent 30 minutes of neurocritical care time  in the care of  this patient.      Delia Heady, MD Medical Director [facility] Pager: [phone] 06/04/2021 5:53 PM  To contact Stroke Continuity provider, please refer to [email or web address]. After hours, contact General Neurology

## 2021-06-04 NOTE — Evaluation (Signed)
Clinical/Bedside Swallow Evaluation Patient Details  Name: [name] Costa MRN: [id number] Date of Birth: [birth year]  Today's Date: 06/04/2021 Time: SLP Start Time (ACUTE ONLY): 1100 SLP Stop Time (ACUTE ONLY): 1124 SLP Time Calculation (min) (ACUTE ONLY): 24 min  Past Medical History:  Past Medical History:  Diagnosis Date   Bladder infection    Trichomonas    Past Surgical History:  Past Surgical History:  Procedure Laterality Date   CHOLECYSTECTOMY     CHOLECYSTECTOMY     TUBAL LIGATION Bilateral 04/13/2013   Procedure: POST PARTUM TUBAL LIGATION;  Surgeon: Bing Plume, MD;  Location: [facility];  Service: Gynecology;  Laterality: Bilateral;   HPI:  40 yr old woke up this am and noted gait disturbance, left sided arm/leg and facial weakness. MRI showed mildly restricted diffusion without FLAIR changes with a right lateral lenticulostriate distribution. PMH; UTI   Assessment / Plan / Recommendation Clinical Impression  Pt was awake however sleepy from event initiating 0430 today.Suspect pharyngeal dysfunction demonstrated during clinical swallow assessment. CN VII disturbance resulting in weakness and decreased sensation; left lingual deviation from CN XII impairments. Mild saliva aggregated in left buccal cavity. Pharyngeal swallow appeared delayed from subjective measures with strong, prolonged, reflexive cough during 3 oz water consumption. There was labial residue and incomplete clearance following pudding. She would benefit from [facility] to fully evaluate. Plan for MBS tomorrow morning. She may have single ice chip if adequately awake and requesting. SLP Visit Diagnosis: Dysphagia, unspecified (R13.10)    Aspiration Risk  Moderate aspiration risk    Diet Recommendation Other (Comment);NPO (ice chips)   Medication Administration: Crushed with puree    Other  Recommendations Oral Care Recommendations: Oral care QID   Follow up Recommendations Inpatient Rehab      Frequency and  Duration min 2x/week  2 weeks       Prognosis Prognosis for Safe Diet Advancement: Good      Swallow Study   General Date of Onset: 06/04/21 HPI: 40 yr old woke up this am and noted gait disturbance, left sided arm/leg and facial weakness. MRI showed mildly restricted diffusion without FLAIR changes with a right lateral lenticulostriate distribution. PMH; UTI Type of Study: Bedside Swallow Evaluation Previous Swallow Assessment: none Diet Prior to this Study: NPO Temperature Spikes Noted: No Respiratory Status: Room air History of Recent Intubation: No Behavior/Cognition: Cooperative (awake, crying) Oral Cavity Assessment: Dry Oral Care Completed by SLP: Yes Oral Cavity - Dentition: Adequate natural dentition Vision: Functional for self-feeding Self-Feeding Abilities: Needs assist Patient Positioning: Upright in bed Baseline Vocal Quality: Normal Volitional Cough: Strong Volitional Swallow: Able to elicit    Oral/Motor/Sensory Function Overall Oral Motor/Sensory Function: Moderate impairment Facial ROM: Reduced left;Suspected CN VII (facial) dysfunction Facial Symmetry: Abnormal symmetry left;Suspected CN VII (facial) dysfunction Facial Strength: Reduced left;Suspected CN VII (facial) dysfunction Facial Sensation: Reduced left;Suspected CN V (Trigeminal) dysfunction Lingual ROM: Reduced right;Suspected CN XII (hypoglossal) dysfunction Lingual Symmetry: Abnormal symmetry left;Suspected CN XII (hypoglossal) dysfunction Lingual Strength: Reduced;Suspected CN XII (hypoglossal) dysfunction Mandible: Within Functional Limits   Ice Chips Ice chips: Not tested   Thin Liquid Thin Liquid: Impaired Presentation: Cup;Straw Oral Phase Impairments: Reduced lingual movement/coordination Oral Phase Functional Implications: Left anterior spillage Pharyngeal  Phase Impairments: Cough - Delayed;Suspected delayed Swallow;Multiple swallows (storong)    Nectar Thick Nectar Thick Liquid: Not  tested   Honey Thick Honey Thick Liquid: Not tested   Puree Puree: Impaired Oral Phase Functional Implications:  (labial residue)   Solid  Solid: Not tested      [name] Costa 06/04/2021,1:39 PM  [name] Costa.Ed Nurse, children's [phone] Office [phone]

## 2021-06-04 NOTE — ED Notes (Signed)
TPA started.  

## 2021-06-04 NOTE — ED Triage Notes (Signed)
Patient arrived with EMS from home went to bed at 2230 last night woke up this morning at 0400 with left facial droop / asymmetry with left arm and leg weakness , evaluated by EDP and neurologist at arrival and transported to CT scan .

## 2021-06-04 NOTE — Evaluation (Signed)
Physical Therapy Evaluation Patient Details Name: [name] Costa MRN: [id number] DOB: 08/14/1981 Today's Date: 06/04/2021   History of Present Illness  pt is a 40 y/o female admitted 7/5 with left sided weakness and L facial droop with trouble walking.  tPA was administered.  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  PMHx:  chole o/w not pertinent.  Clinical Impression  Pt admitted with/for s/s of stroke with L side weakness, decreased sensation and need for mod to maximal assist for basic mobility..  Pt currently limited functionally due to the problems listed. ( See problems list.)   Pt will benefit from PT to maximize function and safety in order to get ready for next venue listed below.     Follow Up Recommendations CIR;Supervision/Assistance - 24 hour    Equipment Recommendations  Other (comment) (TBA)    Recommendations for Other Services Rehab consult     Precautions / Restrictions Precautions Precautions: Fall      Mobility  Bed Mobility Overal bed mobility: Needs Assistance Bed Mobility: Rolling;Sidelying to Sit Rolling: Max assist Sidelying to sit: Max assist       General bed mobility comments: cues for sequencing, initiation of normalized movement.  assist of trunk to roll L and assist boost tom right and midline.    Transfers Overall transfer level: Needs assistance Equipment used:  (chair back) Transfers: Sit to/from Stand Sit to Stand: Max assist;+2 safety/equipment         General transfer comment: cues for hand placement and assist forward and up. at [facility]  Ambulation/Gait             General Gait Details: Not able.  Stairs            Wheelchair Mobility    Modified Rankin (Stroke Patients Only) Modified Rankin (Stroke Patients Only) Modified Rankin: Severe disability     Balance Overall balance assessment: Needs assistance Sitting-balance support: Single extremity supported;Bilateral upper extremity  supported;No upper extremity supported Sitting balance-Leahy Scale: Fair Sitting balance - Comments: pt is mildly unsteady around midline,  biased posteriorly, but able to hold around midline without UE assist or external support.  Pt does fatigue as time progresses.     Standing balance-Leahy Scale: Poor Standing balance comment: stood x 3 min at EOB with chair back for support while her bed was prepared for returned to supine.  Worked on L knee stability, Upright posture, general balance                             Pertinent Vitals/Pain Pain Assessment: No/denies pain    Home Living Family/patient expects to be discharged to:: Unsure Living Arrangements: Children Available Help at Discharge: Family;Available 24 hours/day;Available PRN/intermittently Type of Home: House Home Access: Level entry     Home Layout: One level Home Equipment: None Additional Comments: 1 adult and 1 adolescent child.    Prior Function Level of Independence: Independent         Comments: drove, worked outside the home.     Hand Dominance        Extremity/Trunk Assessment   Upper Extremity Assessment Upper Extremity Assessment: Defer to OT evaluation (L UE flaccid/low tone and diminished light touch.)    Lower Extremity Assessment Lower Extremity Assessment: RLE deficits/detail;LLE deficits/detail RLE Deficits / Details: WFL LLE Deficits / Details: no spontaneous or voluntary movement to command note except weak movement in extension in stance. LLE Sensation: decreased light touch  LLE Coordination: decreased fine motor;decreased gross motor    Cervical / Trunk Assessment Cervical / Trunk Assessment: Normal  Communication   Communication:  (slurred speech)  Cognition Arousal/Alertness: Awake/alert;Lethargic Behavior During Therapy: WFL for tasks assessed/performed (tearful) Overall Cognitive Status: No family/caregiver present to determine baseline cognitive functioning                                         General Comments General comments (skin integrity, edema, etc.): VSS overall, Sats in the low 90's    Exercises     Assessment/Plan    PT Assessment Patient needs continued PT services  PT Problem List Decreased strength;Decreased activity tolerance;Decreased balance;Decreased mobility;Decreased coordination;Decreased knowledge of use of DME;Impaired sensation;Impaired tone       PT Treatment Interventions DME instruction;Gait training;Functional mobility training;Therapeutic activities;Therapeutic exercise;Neuromuscular re-education;Patient/family education    PT Goals (Current goals can be found in the Care Plan section)  Acute Rehab PT Goals Patient Stated Goal: pt didn't participate, too tearful PT Goal Formulation: Patient unable to participate in goal setting Time For Goal Achievement: 06/18/21 Potential to Achieve Goals: Good    Frequency Min 4X/week   Barriers to discharge        Co-evaluation               AM-PAC PT "6 Clicks" Mobility  Outcome Measure Help needed turning from your back to your side while in a flat bed without using bedrails?: Total Help needed moving from lying on your back to sitting on the side of a flat bed without using bedrails?: A Lot Help needed moving to and from a bed to a chair (including a wheelchair)?: A Lot Help needed standing up from a chair using your arms (e.g., wheelchair or bedside chair)?: A Lot Help needed to walk in hospital room?: Total Help needed climbing 3-5 steps with a railing? : Total 6 Click Score: 9    End of Session   Activity Tolerance: Patient tolerated treatment well;Patient limited by fatigue Patient left: in bed;with call bell/phone within reach;with bed alarm set Nurse Communication: Mobility status PT Visit Diagnosis: Unsteadiness on feet (R26.81);Other abnormalities of gait and mobility (R26.89);Other symptoms and signs involving the nervous  system (R29.898);Hemiplegia and hemiparesis Hemiplegia - Right/Left: Left Hemiplegia - caused by: Cerebral infarction;Other cerebrovascular disease    Time: 1445-1515 PT Time Calculation (min) (ACUTE ONLY): 30 min   Charges:   PT Evaluation $PT Eval Moderate Complexity: 1 Mod PT Treatments $Neuromuscular Re-education: 8-22 mins        06/04/2021  Jacinto Halim., PT Acute Rehabilitation Services [phone]  (pager) [phone]  (office)  Eliseo Gum Reinhold Rickey 06/04/2021, 3:41 PM

## 2021-06-04 NOTE — H&P (Addendum)
NEUROLOGY H&P NOTE   Date of service: June 04, 2021 Patient Name: [name] Costa MRN:  [id number] DOB:  1981/04/28 Reason for consult: "Stroke code for left sided weakness" Requesting Provider: Shon Baton, MD _ _ _   _ __   _ __ _ _  __ __   _ __   __ _  History of Present Illness  [name] Costa is a 40 y.o. left handed female with PMH significant for anxiety who went to bed at 2230 on 06/03/21 and woke up at 0430 on 06/04/21 and mouth felt weird and had trouble walking. Stumbled while walking to the bathroom and noted facial droop on the left. EMS called and she was brought in as a stroke code.  Elevated SBP to over 200s on presentation with normal glucose. She had CTH w/o contrast and was taken to the MRI Brain which demonstrated mild diffusion restriction in the R lateral lenticulostriate distribution with no T2/FLAIR correlate.  Her symptoms were noted to be waxing and waning with some improvement in her weakness, followed by worsening of her deficit.  mRS: 0 tPA: Yes, discussed 30% chance of improvement and about a 6% chance of ICH which could result in worsening deficit or death with the patient. She opted for tPA given that she is left handed and symptoms are very disabling. Thrombectomy: No LVO. NIHSS components Score: Comment  1a Level of Conscious 0[x]  1[]  2[]  3[]      1b LOC Questions 0[x]  1[]  2[]       1c LOC Commands 0[x]  1[]  2[]       2 Best Gaze 0[x]  1[]  2[]       3 Visual 0[x]  1[]  2[]  3[]      4 Facial Palsy 0[]  1[]  2[x]  3[]      5a Motor Arm - left 0[]  1[]  2[x]  3[]  4[]  UN[]    5b Motor Arm - Right 0[x]  1[]  2[]  3[]  4[]  UN[]    6a Motor Leg - Left 0[]  1[]  2[x]  3[]  4[]  UN[]    6b Motor Leg - Right 0[x]  1[]  2[]  3[]  4[]  UN[]    7 Limb Ataxia 0[x]  1[]  2[]  3[]  UN[]     8 Sensory 0[x]  1[]  2[]  UN[]      9 Best Language 0[x]  1[]  2[]  3[]      10 Dysarthria 0[x]  1[]  2[]  UN[]      11 Extinct. and Inattention 0[x]  1[]  2[]       TOTAL: 6       ROS   Constitutional Denies weight  loss, fever and chills.   HEENT Denies changes in vision and hearing.   Respiratory Denies SOB and cough.   CV Denies palpitations and CP   GI Denies abdominal pain, nausea, vomiting and diarrhea.   GU Denies dysuria and urinary frequency.   MSK Denies myalgia and joint pain.   Skin Denies rash and pruritus.   Neurological Denies headache and syncope.   Psychiatric Denies recent changes in mood. Denies anxiety and depression.    Past History   Past Medical History:  Diagnosis Date   Bladder infection    Trichomonas    Past Surgical History:  Procedure Laterality Date   CHOLECYSTECTOMY     CHOLECYSTECTOMY     TUBAL LIGATION Bilateral 04/13/2013   Procedure: POST PARTUM TUBAL LIGATION;  Surgeon: Bing Plume, MD;  Location: [facility];  Service: Gynecology;  Laterality: Bilateral;   No family history on file. Social History   Socioeconomic History   Marital status: Divorced    Spouse name: Not  on file   Number of children: Not on file   Years of education: Not on file   Highest education level: Not on file  Occupational History   Not on file  Tobacco Use   Smoking status: Never   Smokeless tobacco: Never  Substance and Sexual Activity   Alcohol use: No   Drug use: No   Sexual activity: Not on file  Other Topics Concern   Not on file  Social History Narrative   Not on file   Social Determinants of Health   Financial Resource Strain: Not on file  Food Insecurity: Not on file  Transportation Needs: Not on file  Physical Activity: Not on file  Stress: Not on file  Social Connections: Not on file   Allergies  Allergen Reactions   Latex Rash    Medications  (Not in a hospital admission)    Vitals   There were no vitals filed for this visit.   There is no height or weight on file to calculate BMI.  Physical Exam   General: Laying comfortably in bed; crying.  HENT: Normal oropharynx and mucosa. Normal external appearance of ears and nose.  Neck: Supple,  no pain or tenderness  CV: No JVD. No peripheral edema. Pulmonary: Symmetric Chest rise. Normal respiratory effort. Abdomen: Soft to touch, non-tender. Ext: No cyanosis, edema, or deformity Skin: No rash. Normal palpation of skin.  Musculoskeletal: Normal digits and nails by inspection. No clubbing.  Neurologic Examination  Mental status/Cognition: Alert, oriented to self, place, month and year, good attention. Speech/language: Mildly dysarthric, fluent, comprehension intact, object naming intact, repetition intact.  Cranial nerves:   CN II Pupils equal and reactive to light, no VF deficits    CN III,IV,VI EOM intact, no gaze preference or deviation, no nystagmus    CN V normal sensation in V1, V2, and V3 segments bilaterally   CN VII L facial droop   CN VIII normal hearing to speech   CN IX & X normal palatal elevation, no uvular deviation   CN XI 5/5 head turn and 5/5 shoulder shrug bilaterally   CN XII midline tongue protrusion   Motor:  Muscle bulk: normal, tone normal, pronator drift yes LUE drift. Mvmt Root Nerve  Muscle Right Left Comments  SA C5/6 Ax Deltoid 5 3   EF C5/6 Mc Biceps 5 2   EE C6/7/8 Rad Triceps 5 2   WF C6/7 Med FCR     WE C7/8 PIN ECU     F Ab C8/T1 U ADM/FDI 5 4   HF L1/2/3 Fem Illopsoas 5 3   KE L2/3/4 Fem Quad 5 3   DF L4/5 D Peron Tib Ant 5 3   PF S1/2 Tibial Grc/Sol 5 3    Reflexes:  Right Left Comments  Pectoralis      Biceps (C5/6) 2 2   Brachioradialis (C5/6) 2 2    Triceps (C6/7) 2 2    Patellar (L3/4) 2 2    Achilles (S1)      Hoffman      Plantar     Jaw jerk    Sensation:  Light touch intact   Pin prick    Temperature    Vibration   Proprioception    Coordination/Complex Motor:  - Finger to Nose intact on the Right, unable to do on the left. - Heel to shin unable to do. - Rapid alternating movement are slowed in LUE - Gait: Deferred.  Labs   CBC:  No results for input(s): WBC, NEUTROABS, HGB, HCT, MCV, PLT in the last  168 hours.  Basic Metabolic Panel: No results found for: NA, K, CO2, GLUCOSE, BUN, CREATININE, CALCIUM, GFRNONAA, GFRAA, GLUCOSE Lipid Panel: No results found for: LDLCALC HgbA1c: No results found for: HGBA1C Urine Drug Screen: No results found for: LABOPIA, COCAINSCRNUR, LABBENZ, AMPHETMU, THCU, LABBARB  Alcohol Level No results found for: ETH  CT Head without contrast(personally reviewed): CTH was negative for a large hypodensity concerning for a large territory infarct or hyperdensity concerning for an ICH  CT angio Head and Neck with contrast: No LVO  MRI Brain: Mildly restricted diffusion without FLAIR changes with a right lateral lenticulostriate distribution.  Impression   Tonisha L Tillman is a 40 y.o. left handed female with PMH significant for anxiety who went to bed at 2230 on 06/03/21 and woke up at 0430 with fluctuating L sided weakness and left facial droop. NIHSS varied between 4-6. MRI Brain was obtained under wake up protocol and noted to have mild resitrcted diffusion without FLAIR changes in the Right lenticulostriate distribution.  tPA was offered and she said yes to tPA at 0600. Unfortunately her BP was high and had to be brought down with labetalol, hydralazine and Cleviprex. She was visibly distraught and appeared to be having a panic attack and had to be given ativan 2mg  to help with lowering the blood pressure. Took about 19 mins to get her blood pressure down before tPA could be started.  Primary Diagnosis:  Other cerebral infarction due to occlusion of stenosis of small artery.  Recommendations  Plan: - Will admit to ICU given labile blood pressure and post tPA. - Frequent NeuroChecks for post tPA care per stroke unit protocol: - Initial CTH demonstrated no acute hemorrhage or mass - MRI Brain - R lenticulostriate DWI changes with no FLAIR correlate. - CTA - with no LVO - TTE - pending. - Lipid Panel: LDL - pending.  - Statin: if LDL > 70. - HbA1c:  pending. - Antithrombotic: Start ASA 81 mg daily if 24 h CTH does not show acute hemorrhage - DVT prophylaxis: SCDs. Pharmacologic prophylaxis if 24 h CTH does not demonstrate acute hemorrhage - Systolic Blood Pressure goal: < 180 mm Hg - Telemetry monitoring for arrhythmia: 72 hours - Swallow screen - ordered - PT/OT/SLP consults  ______________________________________________________________________  This patient is critically ill and at significant risk of neurological worsening, death and care requires constant monitoring of vital signs, hemodynamics,respiratory and cardiac monitoring, neurological assessment, discussion with family, other specialists and medical decision making of high complexity. I spent 70 minutes of neurocritical care time  in the care of  this patient. This was time spent independent of any time provided by nurse practitioner or PA.  [facility] Pager Number [phone] [phone] 06/04/2021  6:52 AM    Thank you for the opportunity to take part in the care of this patient. If you have any further questions, please contact the neurology consultation attending.  Signed,  08/05/2021 [facility] Pager Number Erick [phone] _ _ _   _ __   _ __ _ _  __ __   _ __   __ _

## 2021-06-04 NOTE — ED Provider Notes (Signed)
[facility] EMERGENCY DEPARTMENT Provider Note   CSN: [id number] Arrival date & time: 06/04/21  6269     History Chief Complaint  Patient presents with   Code Stroke : Left facial droop/Left side weakness    LSN [id number] 06/03/21    [name] Costa is a 40 y.o. female.  HPI     40 year old female with a history of asthma who presents with strokelike symptoms.  She was brought in by EMS as a code stroke.  Patient reports that she went to bed normally at 10:30 PM.  She woke up around 4 AM and could not get up to go to the bathroom.  She noted a change in her voice and left-sided weakness.  EMS noted left-sided facial droop and left upper and lower extremity weakness.  Blood pressure in route noted to be 200s over 100s.  She denies any headache.  She is not on any anticoagulation.  Level 5 caveat for acuity of condition  Past Medical History:  Diagnosis Date   Bladder infection    Trichomonas     Patient Active Problem List   Diagnosis Date Noted   TOBACCO ABUSE 10/15/2009   ALLERGIC RHINITIS 10/15/2009   ASTHMA 10/15/2009    Past Surgical History:  Procedure Laterality Date   CHOLECYSTECTOMY     CHOLECYSTECTOMY     TUBAL LIGATION Bilateral 04/13/2013   Procedure: POST PARTUM TUBAL LIGATION;  Surgeon: Bing Plume, MD;  Location: [facility];  Service: Gynecology;  Laterality: Bilateral;     OB History     Gravida  5   Para  2   Term  2   Preterm      AB  3   Living  2      SAB  1   IAB  2   Ectopic      Multiple      Live Births  1           No family history on file.  Social History   Tobacco Use   Smoking status: Never   Smokeless tobacco: Never  Substance Use Topics   Alcohol use: No   Drug use: No    Home Medications Prior to Admission medications   Medication Sig Start Date End Date Taking? Authorizing Provider  ibuprofen (ADVIL,MOTRIN) 600 MG tablet Take 1 tablet (600 mg total) by mouth every 6 (six) hours as  needed for pain. 04/14/13   Tracey Harries, MD  oxyCODONE-acetaminophen (PERCOCET/ROXICET) 5-325 MG per tablet Take 1 tablet by mouth every 6 (six) hours as needed. 04/14/13   Tracey Harries, MD    Allergies    Latex  Review of Systems   Review of Systems  Unable to perform ROS: Acuity of condition   Physical Exam Updated Vital Signs BP (!) 156/114   Pulse 78   Temp 98 F (36.7 C) (Temporal)   Resp 15   Ht 1.702 m (5\' 7" )   Wt 101.8 kg   SpO2 100%   BMI 35.15 kg/m   Physical Exam Vitals and nursing note reviewed.  Constitutional:      Appearance: She is well-developed. She is not ill-appearing.  HENT:     Head: Normocephalic and atraumatic.     Nose: Nose normal.     Mouth/Throat:     Mouth: Mucous membranes are moist.  Eyes:     Pupils: Pupils are equal, round, and reactive to light.  Cardiovascular:     Rate and  Rhythm: Normal rate and regular rhythm.  Pulmonary:     Effort: Pulmonary effort is normal. No respiratory distress.  Abdominal:     Palpations: Abdomen is soft.  Musculoskeletal:     Cervical back: Neck supple.  Skin:    General: Skin is warm and dry.  Neurological:     Mental Status: She is alert and oriented to person, place, and time.     Comments: Dysarthria noted, left-sided facial droop that spares the forehead, diminished strength left upper and left lower extremity  Psychiatric:        Mood and Affect: Mood normal.    ED Results / Procedures / Treatments   Labs (all labs ordered are listed, but only abnormal results are displayed) Labs Reviewed  CBC - Abnormal; Notable for the following components:      Result Value   WBC 11.1 (*)    Hemoglobin 10.5 (*)    HCT 34.4 (*)    MCH 24.8 (*)    RDW 18.3 (*)    All other components within normal limits  DIFFERENTIAL - Abnormal; Notable for the following components:   Lymphs Abs 4.2 (*)    All other components within normal limits  I-STAT CHEM 8, ED - Abnormal; Notable for the following  components:   Glucose, Bld 106 (*)    Calcium, Ion 1.08 (*)    TCO2 21 (*)    Hemoglobin 11.6 (*)    HCT 34.0 (*)    All other components within normal limits  CBG MONITORING, ED - Abnormal; Notable for the following components:   Glucose-Capillary 112 (*)    All other components within normal limits  RESP PANEL BY RT-PCR (FLU A&B, COVID) ARPGX2  PROTIME-INR  APTT  COMPREHENSIVE METABOLIC PANEL  I-STAT BETA HCG BLOOD, ED ([facility], [facility], [facility] ONLY)    EKG EKG Interpretation  Date/Time:  Tuesday June 04 2021 06:04:18 EDT Ventricular Rate:  90 PR Interval:  156 QRS Duration: 86 QT Interval:  412 QTC Calculation: 505 R Axis:   85 Text Interpretation: Sinus rhythm Abnormal R-wave progression, early transition Left ventricular hypertrophy Borderline prolonged QT interval Confirmed by Ross Marcus ([id number]) on 06/04/2021 6:18:08 AM  Radiology MR BRAIN WO CONTRAST  Result Date: 06/04/2021 CLINICAL DATA:  Left facial droop EXAM: MRI HEAD WITHOUT CONTRAST TECHNIQUE: Multiplanar, multiecho pulse sequences of the brain and surrounding structures were obtained without intravenous contrast. COMPARISON:  Head CT and CTA from earlier today FINDINGS: Brain: Truncated study in the emergent setting. There is a wedge of mildly restricted diffusion in the right lateral lenticulostriate distribution spanning the stratum and corona radiata. No associated FLAIR changes. No chronic ischemia, atrophy, or hydrocephalus. 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 IMPRESSION: Mildly restricted diffusion without FLAIR changes with a right lateral lenticulostriate distribution. Electronically Signed   By: Marnee Spring M.D.   On: 06/04/2021 06:04   CT HEAD CODE STROKE WO CONTRAST  Result Date: 06/04/2021 CLINICAL  DATA:  Code stroke.  Stroke follow up. EXAM: CT HEAD WITHOUT CONTRAST TECHNIQUE: Contiguous axial images were obtained from the base of the skull through the vertex without intravenous contrast. COMPARISON:  None. FINDINGS: Brain: No evidence of acute infarction, hemorrhage, hydrocephalus, extra-axial collection or mass lesion/mass effect. Vascular: No hyperdense vessel or unexpected calcification. Skull: Ground-glass and sclerotic bone lesion with expansion affecting the right  parietal bone and measuring approximately 6 cm in diameter. Sinuses/Orbits: No acute finding Other: These results were communicated to Dr Derry LoryKhaliqdina at 5:33 am on 06/04/2021 by text page via the [facility] messaging system. ASPECTS [facility]([place] Stroke Program Early CT Score) Not scored without localizing history IMPRESSION: 1. No acute finding. 2. 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 Electronically Signed   By: Marnee SpringJonathon  Watts M.D.   On: 06/04/2021 05:34   CT ANGIO HEAD CODE STROKE  Result Date: 06/04/2021 CLINICAL DATA:  Left-sided weakness EXAM: CT ANGIOGRAPHY HEAD AND NECK TECHNIQUE: Multidetector CT imaging of the head and neck was performed using the standard protocol during bolus administration of intravenous contrast. Multiplanar CT image reconstructions and MIPs were obtained to evaluate the vascular anatomy. Carotid stenosis measurements (when applicable) are obtained utilizing NASCET criteria, using the distal internal carotid diameter as the denominator. CONTRAST:  75mL OMNIPAQUE IOHEXOL 350 MG/ML SOLN COMPARISON:  Head CT from earlier today FINDINGS: CTA NECK FINDINGS Aortic arch: Normal.  Three vessel branching. Right carotid system: Vessels are smooth and widely patent. Left carotid system: Vessels are smooth and widely patent. No atheromatous changes. Vertebral arteries: No proximal subclavian stenosis. Limited assessment of the vertebral arteries due to intravenous contrast  reflux on the left more than right. Skeleton: Expansile sclerotic right parietal bone lesion, most consistent with fibrous dysplasia. Scoliosis. Other neck: No acute or aggressive finding in the neck soft tissues. Partial bilateral maxillary sinus opacification Upper chest: Negative Review of the MIP images confirms the above findings CTA HEAD FINDINGS Anterior circulation: Vessels are smooth and widely patent. No branch occlusion, beading, or aneurysm. Posterior circulation: Vessels are smooth and widely patent. No branch occlusion, beading, or aneurysm. Venous sinuses: Unremarkable in the arterial phase Anatomic variants: Hypoplastic left A1 segment and azygos A2 segment. Review of the MIP images confirms the above findings IMPRESSION: No emergent finding.  No stenosis or atheromatous changes. Electronically Signed   By: Marnee SpringJonathon  Watts M.D.   On: 06/04/2021 06:02   CT ANGIO NECK CODE STROKE  Result Date: 06/04/2021 CLINICAL DATA:  Left-sided weakness EXAM: CT ANGIOGRAPHY HEAD AND NECK TECHNIQUE: Multidetector CT imaging of the head and neck was performed using the standard protocol during bolus administration of intravenous contrast. Multiplanar CT image reconstructions and MIPs were obtained to evaluate the vascular anatomy. Carotid stenosis measurements (when applicable) are obtained utilizing NASCET criteria, using the distal internal carotid diameter as the denominator. CONTRAST:  75mL OMNIPAQUE IOHEXOL 350 MG/ML SOLN COMPARISON:  Head CT from earlier today FINDINGS: CTA NECK FINDINGS Aortic arch: Normal.  Three vessel branching. Right carotid system: Vessels are smooth and widely patent. Left carotid system: Vessels are smooth and widely patent. No atheromatous changes. Vertebral arteries: No proximal subclavian stenosis. Limited assessment of the vertebral arteries due to intravenous contrast reflux on the left more than right. Skeleton: Expansile sclerotic right parietal bone lesion, most consistent  with fibrous dysplasia. Scoliosis. Other neck: No acute or aggressive finding in the neck soft tissues. Partial bilateral maxillary sinus opacification Upper chest: Negative Review of the MIP images confirms the above findings CTA HEAD FINDINGS Anterior circulation: Vessels are smooth and widely patent. No branch occlusion, beading, or aneurysm. Posterior circulation: Vessels are smooth and widely patent. No branch occlusion, beading, or aneurysm. Venous sinuses: Unremarkable in the arterial phase Anatomic variants: Hypoplastic left A1 segment and azygos A2 segment. Review of the MIP images confirms the above findings IMPRESSION: No emergent finding.  No stenosis or  atheromatous changes. Electronically Signed   By: Marnee Spring M.D.   On: 06/04/2021 06:02    Procedures .Critical Care  Date/Time: 06/04/2021 6:19 AM Performed by: Shon Baton, MD Authorized by: Shon Baton, MD   Critical care provider statement:    Critical care time (minutes):  31   Critical care was time spent personally by me on the following activities:  Discussions with consultants, evaluation of patient's response to treatment, examination of patient, ordering and performing treatments and interventions, ordering and review of laboratory studies, ordering and review of radiographic studies, pulse oximetry, re-evaluation of patient's condition, obtaining history from patient or surrogate and review of old charts   Medications Ordered in ED Medications  sodium chloride flush (NS) 0.9 % injection 3 mL (has no administration in time range)  clevidipine (CLEVIPREX) infusion 0.5 mg/mL (10 mg/hr Intravenous Rate/Dose Change 06/04/21 0612)  iohexol (OMNIPAQUE) 350 MG/ML injection 75 mL (75 mLs Intravenous Contrast Given 06/04/21 0534)  labetalol (NORMODYNE) injection 20 mg (20 mg Intravenous Given 06/04/21 0558)  LORazepam (ATIVAN) 2 MG/ML injection (1 mg  Given 06/04/21 3329)    ED Course  I have reviewed the triage vital  signs and the nursing notes.  Pertinent labs & imaging results that were available during my care of the patient were reviewed by me and considered in my medical decision making (see chart for details).    MDM Rules/Calculators/A&P                          Patient presents as a code stroke.  She has left-sided symptoms with facial droop and left upper and lower extremity weakness.  Exam is consistent with this.  She is significantly hypertensive.  She was taken immediately to the CT scanner with neurology.  She was given a dose of labetalol.  She was emergently taken to MRI and per neurology has evidence of an acute stroke.  She will receive tPA and will be admitted to the neurology service.  Final Clinical Impression(s) / ED Diagnoses Final diagnoses:  Cerebrovascular accident (CVA), unspecified mechanism [facility])    Rx / DC Orders ED Discharge Orders     None        Estelle Greenleaf, Mayer Masker, MD 06/04/21 [phone]

## 2021-06-05 ENCOUNTER — Inpatient Hospital Stay (HOSPITAL_COMMUNITY): Payer: BC Managed Care – PPO

## 2021-06-05 DIAGNOSIS — I639 Cerebral infarction, unspecified: Secondary | ICD-10-CM

## 2021-06-05 LAB — CARDIOLIPIN ANTIBODIES, IGG, IGM, IGA
Anticardiolipin IgA: <9 APL U/mL (ref 0–11)
Anticardiolipin IgG: <9 GPL U/mL (ref 0–14)
Anticardiolipin IgM: 108 MPL U/mL — ABNORMAL HIGH (ref 0–12)

## 2021-06-05 LAB — LIPID PANEL
Cholesterol: 188 mg/dL (ref 0–200)
HDL: 55 mg/dL (ref >40)
LDL Cholesterol: 115 mg/dL — ABNORMAL HIGH (ref 0–99)
Total CHOL/HDL Ratio: 3.4 RATIO
Triglycerides: 88 mg/dL (ref <150)
VLDL: 18 mg/dL (ref 0–40)

## 2021-06-05 LAB — HEMOGLOBIN A1C
Hgb A1c MFr Bld: 5.4 % (ref 4.8–5.6)
Mean Plasma Glucose: 108.28 mg/dL

## 2021-06-05 MED ORDER — ENOXAPARIN SODIUM 40 MG/0.4ML IJ SOSY
40.0000 mg | PREFILLED_SYRINGE | INTRAMUSCULAR | Status: DC
  Administered 2021-06-05: 40 mg via SUBCUTANEOUS
  Filled 2021-06-05: qty 0.4

## 2021-06-05 MED ORDER — ASPIRIN 81 MG PO CHEW
81.0000 mg | CHEWABLE_TABLET | Freq: Every day | ORAL | Status: DC
  Administered 2021-06-07: 81 mg via ORAL
  Filled 2021-06-05 (×2): qty 1

## 2021-06-05 MED ORDER — ASPIRIN 300 MG RE SUPP
300.0000 mg | Freq: Every day | RECTAL | Status: DC
  Administered 2021-06-05: 300 mg via RECTAL
  Filled 2021-06-05: qty 1

## 2021-06-05 MED ORDER — CLOPIDOGREL BISULFATE 75 MG PO TABS
75.0000 mg | ORAL_TABLET | Freq: Every day | ORAL | Status: DC
  Administered 2021-06-05 – 2021-06-07 (×2): 75 mg via ORAL
  Filled 2021-06-05 (×2): qty 1

## 2021-06-05 NOTE — Progress Notes (Addendum)
   [facility] has been requested to perform a transesophageal echocardiogram on [name] Costa for stroke.  After careful review of history and examination, the risks and benefits of transesophageal echocardiogram have been explained including risks of esophageal damage, perforation (1:10,000 risk), bleeding, pharyngeal hematoma as well as other potential complications associated with conscious sedation including aspiration, arrhythmia, respiratory failure and death. Alternatives to treatment were discussed, questions were answered. Patient is willing to proceed. Patient's sister present during consent and also agreeable with plan.  Procedure scheduled for 06/06/2021 at 10:30am with Dr. Bjorn Pippin.  Corrin Parker, PA-C 06/05/2021 5:17 PM

## 2021-06-05 NOTE — Evaluation (Signed)
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 MRI showed mildly restricted diffusion without FLAIR changes with a right lateral lenticulostriate distribution. PMH; UTI   Assessment / Plan / Recommendation Clinical Impression  Pt exhibits dysarthria marked by imprecise articulation, CN VII/XII impairments. Cognition assessed using the SLUMS scoring a 19/30. She was unable to recall any of the 5 words independently, 4/5 correct with semantic cues. Accurate for 1/2 mental calculation questions and lost 4 points answering questions re: paragraph. ST will continue on acute and recommend on CIR.    SLP Assessment  SLP Recommendation/Assessment: Patient needs continued Speech Lanaguage Pathology Services SLP Visit Diagnosis: Dysarthria and anarthria (R47.1);Cognitive communication deficit (R41.841)    Follow Up Recommendations  Inpatient Rehab    Frequency and Duration min 2x/week  2 weeks      SLP Evaluation Cognition  Overall Cognitive Status: Impaired/Different from baseline Arousal/Alertness: Awake/alert Orientation Level: Oriented X4 Attention:  Sustained Sustained Attention: Appears intact Memory: Impaired Memory Impairment: Retrieval deficit;Storage deficit (mostly retrieval) Awareness: Appears intact Problem Solving: Impaired Problem Solving Impairment: Verbal complex Safety/Judgment: Appears intact       Comprehension  Auditory Comprehension Overall Auditory Comprehension: Appears within functional limits for tasks assessed Visual Recognition/Discrimination Discrimination: Not tested Reading Comprehension Reading Status: Not tested    Expression Expression Primary Mode of Expression: Verbal Verbal Expression Overall Verbal Expression: Appears within functional limits for tasks assessed Pragmatics: No impairment Written Expression Dominant Hand: Left Written Expression: Not tested   Oral / Motor  Oral Motor/Sensory Function Overall Oral Motor/Sensory Function: Moderate impairment Facial ROM: Reduced left;Suspected CN VII (facial) dysfunction Facial Symmetry: Abnormal symmetry left;Suspected CN VII (facial) dysfunction Facial Strength: Reduced left;Suspected CN VII (facial) dysfunction Facial Sensation: Reduced left;Suspected CN V (Trigeminal) dysfunction Lingual ROM: Reduced right;Suspected CN XII (hypoglossal) dysfunction Lingual Symmetry: Abnormal symmetry left;Suspected CN XII (hypoglossal) dysfunction Lingual Strength: Reduced;Suspected CN XII (hypoglossal) dysfunction Motor Speech Overall Motor Speech: Impaired Respiration: Within functional limits Phonation: Normal Resonance: Within functional limits Articulation: Impaired Level of Impairment: Conversation Intelligibility: Intelligible Motor Planning: Witnin functional limits   GO                    Royce Macadamia 06/05/2021, 3:25 PM Breck Coons Nashika Coker M.Ed Nurse, children's [phone] Office [phone]

## 2021-06-05 NOTE — H&P (View-Only) (Signed)
STROKE TEAM PROGRESS NOTE   SUBJECTIVE (INTERVAL HISTORY) [name] Costa was sitting up in bed today, NAD, no family at bedside . She was able to follow commands. Passed Barium swallow today 7/6. Will transfer to floor today. Neuro exam is stable. Slight improvements with left side movement.  TCD bubble study performed at the bedside after rounds and was negative for PFO  OBJECTIVE Vitals:   06/05/21 0500 06/05/21 0600 06/05/21 0630 06/05/21 0700  BP: (!) 147/99 (!) 145/90    Pulse: 66   79  Resp: (!) 24 16 17 15   Temp:      TempSrc:      SpO2: (!) 84%   100%  Weight:      Height:        CBC:  Recent Labs  Lab 06/04/21 0540 06/04/21 0555  WBC 11.1*  --   NEUTROABS 6.1  --   HGB 10.5* 11.6*  HCT 34.4* 34.0*  MCV 81.3  --   PLT 248  --     Basic Metabolic Panel:  Recent Labs  Lab 06/04/21 0540 06/04/21 0555  NA 136 138  K 3.6 3.5  CL 106 106  CO2 21*  --   GLUCOSE 112* 106*  BUN 10 11  CREATININE 0.74 0.70  CALCIUM 8.5*  --     Lipid Panel:  Recent Labs  Lab 06/05/21 0150  CHOL 188  TRIG 88  HDL 55  CHOLHDL 3.4  VLDL 18  LDLCALC 08/06/21*   HgbA1c:  Lab Results  Component Value Date   HGBA1C 5.4 06/05/2021   Urine Drug Screen:     Component Value Date/Time   LABOPIA NONE DETECTED 06/04/2021 0940   COCAINSCRNUR NONE DETECTED 06/04/2021 0940   LABBENZ NONE DETECTED 06/04/2021 0940   AMPHETMU NONE DETECTED 06/04/2021 0940   THCU POSITIVE (A) 06/04/2021 0940   LABBARB NONE DETECTED 06/04/2021 0940    Alcohol Level No results found for: [facility]  IMAGING  Results for orders placed or performed during the hospital encounter of 06/04/21  MR BRAIN WO CONTRAST   Narrative   CLINICAL DATA:  Left facial droop  EXAM: MRI HEAD WITHOUT CONTRAST  TECHNIQUE: Multiplanar, multiecho pulse sequences of the brain and surrounding structures were obtained without intravenous contrast.  COMPARISON:  Head CT and CTA from earlier today  FINDINGS: Brain: Truncated  study in the emergent setting. There is a wedge of mildly restricted diffusion in the right lateral lenticulostriate distribution spanning the stratum and corona radiata. No associated FLAIR changes. No chronic ischemia, atrophy, or hydrocephalus. 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  IMPRESSION: Mildly restricted diffusion without FLAIR changes with a right lateral lenticulostriate distribution.   Electronically Signed   By: 08/05/21 M.D.   On: 06/04/2021 06:04   CT HEAD WO CONTRAST   Narrative   CLINICAL DATA:  Stroke follow-up.  Change in mental status after tPA  EXAM: CT HEAD WITHOUT CONTRAST  TECHNIQUE: Contiguous axial images were obtained from the base of the skull through the vertex without intravenous contrast.  COMPARISON:  Head CT from earlier today  FINDINGS: Brain: No hemorrhage, hydrocephalus, or swelling. Subtle areas of low-density at the right caudate and posterior putamen, areas of restricted diffusion by prior MRI.  Vascular: No hyperdense vessel.  Skull: Normal. Negative for fracture or focal lesion.  Sinuses/Orbits: No acute finding.  IMPRESSION: No developments since prior MRI.  No intracranial hemorrhage.   Electronically Signed   By: Marnee Spring M.D.   On: 06/04/2021 09:13     PHYSICAL EXAM  General exam   Pleasant obese young African-American lady who appears quite tearful and emotional  HEENT-  Normocephalic, no lesions, without obvious abnormality.  Normal external eye and conjunctiva.   Cardiovascular- RRR, on telemetry Lungs-breathing comfortably on room air Abdomen- soft Musculoskeletal-no obvious joint tenderness, deformity or swelling Skin-warm and dry, no hyperpigmentation, vitiligo, or  suspicious lesions  Neurologic Exam: General: Pleasant middle-aged African-American lady.  Not in distress.   Mental Status: Alert, oriented, thought content appropriate.  Speech mildly dysarthric without evidence of aphasia.  Able to follow 3 step commands without difficulty. Cranial Nerves: II:  Visual fields grossly normal, PERRL III,IV, VI: ptosis not present, extra-ocular motions intact bilaterally V,VII: Moderate left lower facial weakness.  T, light touch sensation intact bilaterally VIII: hearing intact bilaterally XII: tongue extension deviated to the left, without atrophy or fasciculations  Motor: Right : Upper extremity   5/5  Left:     Upper extremity   1/5  Lower extremity   5/5   Lower extremity   2/5 hip, 2/5 ankle Tone and bulk:normal tone throughout; no atrophy noted Sensory: Light touch intact throughout, bilaterally Gait: Deferred   ASSESSMENT/PLAN [name] Costa is a 40 y.o. female with history of anxiety and high blood pressure who presented to [facility] this morning with complaints of left-sided weakness and left facial droop. MR brain showed mildly restricted diffusion without FLAIR changes with a right lateral lenticulostriate distribution. [name] was LKW 6 hours prior to arrival and was provided TPA due to a DWI/FLAIR mismatch and after a risk/benefit conversation with the patient.    # Mildly restricted diffusion without FLAIR changes with a right lateral lenticulostriate distribution.  Etiology likely cryptogenic as it appears to be quite large for lacunar infarct Unclear etiology at this time. Known risk factors at this time include uncontrolled HBP, PCOS but isn't on hormonal treatment. Will continue with stroke workup. 2D Echo pending LDL pending - add statin if LDL >70 HgbA1c pending UDS positive for marijuana TCD bubble study negative for PFO Passed barium swallow today, Dysphagia 3 diet ordered. for VTE prophylaxis No antithrombotic prior to  admission; start DAPT today x  90 days, aspirin 81mg  thereafter Therapy recommendations: CIR  Hypertension Currently on cleviprex. Switch to norvasc when cleared for PO Permissive hypertension (OK if < 220/120) but gradually normalize in 5-7 days Long-term BP goal normotensive  Hospital day # 1  , MSN, NP-C [facility] [phone]  I have personally obtained history,examined this patient, reviewed notes, independently viewed imaging studies, participated in medical decision making and plan of care.ROS completed by me personally and pertinent positives fully documented  I have made any additions or clarifications directly to the above note. Agree with note above.  Patient was counseled to quit marijuana.  Transfer to neurology floor bed today.  Check TEE tomorrow.  Follow anticardiolipin antibodies.  Likely transfer to inpatient rehab in a few days.  Greater than 50% time during this 35-minute visit was spent on counseling and coordination of care and discussion with care team. [phone], MD Medical Director [facility] Pager: [phone] 06/05/2021 2:33 PM   06/05/2021 8:23 AM  To contact Stroke Continuity provider, please refer to 08/06/2021. After hours, contact General Neurology

## 2021-06-05 NOTE — Progress Notes (Signed)
Physical Therapy Treatment Patient Details Name: [name] Costa MRN: [id number] DOB: July 01, 1981 Today's Date: 06/05/2021    History of Present Illness pt is a 40 y/o female admitted 7/5 with left sided weakness and L facial droop with trouble walking.  tPA was administered.  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  PMHx:  chole o/w not pertinent.    PT Comments    Pt with capability to lift L UE and L lower extremity against gravity, but demonstrates delayed and impaired coordination with muscle activation, impacting her ability to steady her L knee with gait. Pt needing anterior and posterior support at the knee during L stance to prevent both knee buckling and hyperextension. Pt was able to demonstrate improved strength and coordination to allow her to ambulate up to ~15 ft this date with UE support and modAx2 though. Will continue to follow acutely. Current recommendations remain appropriate.   Follow Up Recommendations  CIR;Supervision/Assistance - 24 hour     Equipment Recommendations  Other (comment) (TBA)    Recommendations for Other Services       Precautions / Restrictions Precautions Precautions: Fall Restrictions Weight Bearing Restrictions: No    Mobility  Bed Mobility Overal bed mobility: Needs Assistance Bed Mobility: Supine to Sit     Supine to sit: Mod assist     General bed mobility comments: Cues to manage L leg to EOB as she initially brought her R leg off but left her L leg on the bed. ModA to complete but good initiation.    Transfers Overall transfer level: Needs assistance Equipment used: 2 person hand held assist Transfers: Sit to/from Stand Sit to Stand: Mod assist;+2 physical assistance         General transfer comment: Cues for L hand placement sit <> stand from EOB and from toilet each 1x. ModAx2 to power up to stand and extend hips and knees with L knee block.  Ambulation/Gait Ambulation/Gait assistance: Mod  assist;+2 physical assistance Gait Distance (Feet): 15 Feet (x2 bouts of ~15 ft > ~8 ft) Assistive device: 2 person hand held assist Gait Pattern/deviations: Step-to pattern;Decreased step length - left;Decreased stance time - left;Decreased weight shift to left;Decreased dorsiflexion - left;Decreased stride length;Trunk flexed Gait velocity: reduced Gait velocity interpretation: <1.31 ft/sec, indicative of household ambulator General Gait Details: Pt with bil HHA with support on R and PT facilitating L leg on L. Pt needing cues to extend L knee during stance and to weight shift, displaying L knee buckle. Needs tactile cues and assistance at her posterior L leg to initiate advancement with stepping. Intermittent posterior blocking of L knee during stance to also prevent hyperextension.   Stairs             Wheelchair Mobility    Modified Rankin (Stroke Patients Only) Modified Rankin (Stroke Patients Only) Pre-Morbid Rankin Score: No symptoms Modified Rankin: Moderately severe disability     Balance Overall balance assessment: Needs assistance Sitting-balance support: No upper extremity supported Sitting balance-Leahy Scale: Fair Sitting balance - Comments: Able to maintain static sitting balance with supervision.   Standing balance support: Bilateral upper extremity supported;During functional activity;Single extremity supported Standing balance-Leahy Scale: Poor Standing balance comment: 1-2 UE support for mobility and 1 UE support with standing activities at sink along with physical assistance from therapist                            Cognition Arousal/Alertness: Awake/alert Behavior During  Therapy: WFL for tasks assessed/performed Overall Cognitive Status: Impaired/Different from baseline Area of Impairment: Memory;Safety/judgement;Awareness;Problem solving                     Memory: Decreased short-term memory   Safety/Judgement: Decreased  awareness of deficits;Decreased awareness of safety Awareness: Emergent Problem Solving: Decreased initiation;Difficulty sequencing;Requires verbal cues;Requires tactile cues General Comments: Pt did not remember seeing a PT yesterday for her eval. Pt with poor awareness into her capabilities with her L UE but aware that it was weaker than her R. Requires single step commands to sequence steps to ambulate.      Exercises      General Comments General comments (skin integrity, edema, etc.): VSS on RA      Pertinent Vitals/Pain Pain Assessment: Faces Faces Pain Scale: No hurt Pain Intervention(s): Monitored during session    Home Living                      Prior Function            PT Goals (current goals can now be found in the care plan section) Acute Rehab PT Goals Patient Stated Goal: to improve PT Goal Formulation: With patient Time For Goal Achievement: 06/18/21 Potential to Achieve Goals: Good Progress towards PT goals: Progressing toward goals    Frequency    Min 4X/week      PT Plan Current plan remains appropriate    Co-evaluation PT/OT/SLP Co-Evaluation/Treatment: Yes Reason for Co-Treatment: For patient/therapist safety;To address functional/ADL transfers PT goals addressed during session: Mobility/safety with mobility;Balance        AM-PAC PT "6 Clicks" Mobility   Outcome Measure  Help needed turning from your back to your side while in a flat bed without using bedrails?: A Lot Help needed moving from lying on your back to sitting on the side of a flat bed without using bedrails?: A Lot Help needed moving to and from a bed to a chair (including a wheelchair)?: A Lot Help needed standing up from a chair using your arms (e.g., wheelchair or bedside chair)?: A Lot Help needed to walk in hospital room?: Total Help needed climbing 3-5 steps with a railing? : Total 6 Click Score: 10    End of Session Equipment Utilized During Treatment:  Gait belt Activity Tolerance: Patient tolerated treatment well Patient left: with call bell/phone within reach;in chair;with chair alarm set Nurse Communication: Mobility status PT Visit Diagnosis: Unsteadiness on feet (R26.81);Other abnormalities of gait and mobility (R26.89);Other symptoms and signs involving the nervous system (R29.898);Hemiplegia and hemiparesis;Muscle weakness (generalized) (M62.81);Difficulty in walking, not elsewhere classified (R26.2) Hemiplegia - Right/Left: Left Hemiplegia - caused by: Cerebral infarction;Other cerebrovascular disease     Time: 1042-1107 PT Time Calculation (min) (ACUTE ONLY): 25 min  Charges:  $Gait Training: 8-22 mins                     Raymond Gurney, PT, DPT Acute Rehabilitation Services  Pager: [phone] Office: [phone]    Jewel Baize 06/05/2021, 12:47 PM

## 2021-06-05 NOTE — Evaluation (Signed)
Occupational Therapy Evaluation Patient Details Name: [name] Costa MRN: [id number] DOB: 11-02-1981 Today's Date: 06/05/2021    History of Present Illness pt is a 40 y/o female admitted 7/5 with left sided weakness and L facial droop with trouble walking.  tPA was administered.  MRI showed infarct in the R lateral lenticulosriate distribution.  PMHx:  chole o/w not pertinent.   Clinical Impression   Pt PTA: Pt living with 1 son and working. Pt reports independence. Pt currently, requiring 1 step, multimodal cues to complete task. Pt trouble sequencing with ADL task at sink. Pt appears to be speaking clearly with only occasional slurring. Pt limited by decreased strength, decreased activity tolerance and decreased cognition. Pt requires modA+2 for bed mobility and transfers. Pt ambulating modA+2 to bathroom standing at sink x2 mins before seated rest break and to recliner. Pt would benefit from continued OT skilled services. OT following acutely.    Follow Up Recommendations  CIR    Equipment Recommendations  3 in 1 bedside commode    Recommendations for Other Services Rehab consult     Precautions / Restrictions Precautions Precautions: Fall Restrictions Weight Bearing Restrictions: No      Mobility Bed Mobility Overal bed mobility: Needs Assistance Bed Mobility: Supine to Sit Rolling: Mod assist   Supine to sit: Mod assist     General bed mobility comments: cues to not "forget" LLE.    Transfers Overall transfer level: Needs assistance Equipment used: 2 person hand held assist Transfers: Sit to/from Stand Sit to Stand: Mod assist;+2 physical assistance         General transfer comment: L knee block; sequencing cues and cues for hand placement    Balance Overall balance assessment: Needs assistance Sitting-balance support: No upper extremity supported Sitting balance-Leahy Scale: Fair Sitting balance - Comments: Able to maintain static sitting balance with  supervision.   Standing balance support: Bilateral upper extremity supported;During functional activity;Single extremity supported Standing balance-Leahy Scale: Poor Standing balance comment: single to BUE support for grooming task at sink; LLE supported in knee block and eventually pt seated for tasks.                           ADL either performed or assessed with clinical judgement   ADL Overall ADL's : Needs assistance/impaired Eating/Feeding: Set up;Sitting   Grooming: Min guard;Sitting;Minimal assistance;Standing Grooming Details (indicate cue type and reason): Assist in standing at sink for LLE knee block Upper Body Bathing: Minimal assistance;Sitting   Lower Body Bathing: Moderate assistance;Sitting/lateral leans;Sit to/from stand;Cueing for safety;Cueing for sequencing   Upper Body Dressing : Minimal assistance;Sitting;Cueing for safety;Cueing for sequencing   Lower Body Dressing: Moderate assistance;Sitting/lateral leans;Sit to/from stand Lower Body Dressing Details (indicate cue type and reason): figure 4 with RLE and assist for LLE to start sock lying in bed with HOB elevated Toilet Transfer: Moderate assistance;+2 for physical assistance;+2 for safety/equipment;Cueing for safety;Cueing for sequencing;Ambulation;Regular Toilet;Grab bars Toilet Transfer Details (indicate cue type and reason): physical assist for LLE to move and assist with weightshifting. ModA +2 Toileting- Clothing Manipulation and Hygiene: Moderate assistance;+2 for physical assistance;+2 for safety/equipment;Cueing for safety;Cueing for sequencing;Sitting/lateral lean;Sit to/from stand       Functional mobility during ADLs: Moderate assistance;+2 for physical assistance;+2 for safety/equipment;Cueing for safety;Cueing for sequencing General ADL Comments: Pt limited by decreased strength, decreased activity tolerance and decreased cognition. Pt requires modA+2 for bed mobility and transfers. Pt  requires multimodal cues to accomplish a task.  Vision Baseline Vision/History: Wears glasses Wears Glasses: Reading only Patient Visual Report: No change from baseline Vision Assessment?: Vision impaired- to be further tested in functional context Additional Comments: conitnue to assess; cues to look L at times     Perception     Praxis      Pertinent Vitals/Pain Pain Assessment: Faces Faces Pain Scale: No hurt Pain Intervention(s): Monitored during session     Hand Dominance Left   Extremity/Trunk Assessment Upper Extremity Assessment Upper Extremity Assessment: RUE deficits/detail;LUE deficits/detail RUE Deficits / Details: AROM, WFLs; fair grip LUE Deficits / Details: AROM, FF to 80* por grip strength and incoordination LUE Coordination: decreased fine motor;decreased gross motor   Lower Extremity Assessment LLE Deficits / Details: AROM noted today   Cervical / Trunk Assessment Cervical / Trunk Assessment: Normal   Communication Communication Communication: Other (comment) (occasional slurred speech)   Cognition Arousal/Alertness: Awake/alert Behavior During Therapy: WFL for tasks assessed/performed Overall Cognitive Status: Impaired/Different from baseline Area of Impairment: Memory;Safety/judgement;Awareness;Problem solving                     Memory: Decreased short-term memory   Safety/Judgement: Decreased awareness of deficits;Decreased awareness of safety Awareness: Emergent Problem Solving: Decreased initiation;Difficulty sequencing;Requires verbal cues;Requires tactile cues General Comments: Pt requires 1 step, multimodal cues to complete task. Pt trouble sequencing with ADL task at sink. Pt appears to be speaking clearly with only occasional slurring. Pt limited by decreased strength L > R, decreased ability to care for self and decreased cognition.   General Comments  VSS on RA    Exercises     Shoulder Instructions      Home Living  Family/patient expects to be discharged to:: Private residence Living Arrangements: Children Available Help at Discharge: Family;Available 24 hours/day;Available PRN/intermittently Type of Home: House Home Access: Level entry     Home Layout: One level     Bathroom Shower/Tub: Chief Strategy Officer: Standard     Home Equipment: None   Additional Comments: 1 adult and 1 adolescent child with Autism.      Prior Functioning/Environment Level of Independence: Independent        Comments: drove, worked outside the home.        OT Problem List: Decreased strength;Decreased activity tolerance;Impaired balance (sitting and/or standing);Impaired vision/perception;Decreased coordination;Decreased cognition;Decreased safety awareness;Pain;Impaired UE functional use;Impaired tone;Impaired sensation      OT Treatment/Interventions: Self-care/ADL training;Therapeutic exercise;Neuromuscular education;Energy conservation;Therapeutic activities;Cognitive remediation/compensation;Visual/perceptual remediation/compensation;Patient/family education;Balance training    OT Goals(Current goals can be found in the care plan section) Acute Rehab OT Goals Patient Stated Goal: to go to rehab OT Goal Formulation: With patient Time For Goal Achievement: 06/19/21 Potential to Achieve Goals: Good ADL Goals Pt Will Perform Grooming: with min guard assist;standing Pt Will Perform Lower Body Dressing: sit to/from stand;with min assist Pt Will Transfer to Toilet: ambulating;with min assist Additional ADL Goal #1: Pt will gather ADL items and sequence through ADL routine with minimal cues. Additional ADL Goal #2: Pt will stand x6 mins of OOB ADL with minA for stability and minimal cues for safety.  OT Frequency: Min 2X/week   Barriers to D/C:            Co-evaluation PT/OT/SLP Co-Evaluation/Treatment: Yes Reason for Co-Treatment: Complexity of the patient's impairments (multi-system  involvement);To address functional/ADL transfers   OT goals addressed during session: ADL's and self-care      AM-PAC OT "6 Clicks" Daily Activity     Outcome Measure Help from  another person eating meals?: A Little Help from another person taking care of personal grooming?: A Little Help from another person toileting, which includes using toliet, bedpan, or urinal?: A Lot Help from another person bathing (including washing, rinsing, drying)?: A Lot Help from another person to put on and taking off regular upper body clothing?: A Little Help from another person to put on and taking off regular lower body clothing?: A Lot 6 Click Score: 15   End of Session Equipment Utilized During Treatment: Gait belt Nurse Communication: Mobility status  Activity Tolerance: Patient tolerated treatment well Patient left: in chair;with call bell/phone within reach;with chair alarm set  OT Visit Diagnosis: Unsteadiness on feet (R26.81);Muscle weakness (generalized) (M62.81);Other symptoms and signs involving cognitive function;Hemiplegia and hemiparesis Hemiplegia - Right/Left: Left Hemiplegia - dominant/non-dominant: Dominant Hemiplegia - caused by: Cerebral infarction                Time: 9476-5465 OT Time Calculation (min): 23 min Charges:  OT General Charges $OT Visit: 1 Visit OT Evaluation $OT Eval Moderate Complexity: 1 Mod  [name] Costa, OTR/L Acute Rehabilitation Services Pager: [phone] Office: [phone]   [name] Costa 06/05/2021, 8:00 PM

## 2021-06-05 NOTE — Progress Notes (Signed)
Nutrition Brief Note  Patient identified on the Malnutrition Screening Tool (MST) Report  Per pt she lost a few pounds PTA due to stressors in her life. Per report her appetite is so-so post stroke. Per pt, ambassador has reviewed the menu with her helping her with options. Encouraged PO intake.    Wt Readings from Last 15 Encounters:  06/04/21 101.8 kg  04/12/13 104.3 kg  10/30/12 87.5 kg    Body mass index is 35.15 kg/m.   Current diet order is Dysphagia 3 with Thin liquids, patient is consuming approximately >50% of meals at this time. Labs and medications reviewed.   If nutrition issues arise, please consult RD.   Cammy Copa., RD, LDN, CNSC See AMiON for contact information

## 2021-06-05 NOTE — Progress Notes (Addendum)
STROKE TEAM PROGRESS NOTE   SUBJECTIVE (INTERVAL HISTORY) [name] Costa was sitting up in bed today, NAD, no family at bedside . She was able to follow commands. Passed Barium swallow today 7/6. Will transfer to floor today. Neuro exam is stable. Slight improvements with left side movement.  TCD bubble study performed at the bedside after rounds and was negative for PFO  OBJECTIVE Vitals:   06/05/21 0500 06/05/21 0600 06/05/21 0630 06/05/21 0700  BP: (!) 147/99 (!) 145/90    Pulse: 66   79  Resp: (!) 24 16 17 15   Temp:      TempSrc:      SpO2: (!) 84%   100%  Weight:      Height:        CBC:  Recent Labs  Lab 06/04/21 0540 06/04/21 0555  WBC 11.1*  --   NEUTROABS 6.1  --   HGB 10.5* 11.6*  HCT 34.4* 34.0*  MCV 81.3  --   PLT 248  --     Basic Metabolic Panel:  Recent Labs  Lab 06/04/21 0540 06/04/21 0555  NA 136 138  K 3.6 3.5  CL 106 106  CO2 21*  --   GLUCOSE 112* 106*  BUN 10 11  CREATININE 0.74 0.70  CALCIUM 8.5*  --     Lipid Panel:  Recent Labs  Lab 06/05/21 0150  CHOL 188  TRIG 88  HDL 55  CHOLHDL 3.4  VLDL 18  LDLCALC 08/06/21*   HgbA1c:  Lab Results  Component Value Date   HGBA1C 5.4 06/05/2021   Urine Drug Screen:     Component Value Date/Time   LABOPIA NONE DETECTED 06/04/2021 0940   COCAINSCRNUR NONE DETECTED 06/04/2021 0940   LABBENZ NONE DETECTED 06/04/2021 0940   AMPHETMU NONE DETECTED 06/04/2021 0940   THCU POSITIVE (A) 06/04/2021 0940   LABBARB NONE DETECTED 06/04/2021 0940    Alcohol Level No results found for: [facility]  IMAGING  Results for orders placed or performed during the hospital encounter of 06/04/21  MR BRAIN WO CONTRAST   Narrative   CLINICAL DATA:  Left facial droop  EXAM: MRI HEAD WITHOUT CONTRAST  TECHNIQUE: Multiplanar, multiecho pulse sequences of the brain and surrounding structures were obtained without intravenous contrast.  COMPARISON:  Head CT and CTA from earlier today  FINDINGS: Brain: Truncated  study in the emergent setting. There is a wedge of mildly restricted diffusion in the right lateral lenticulostriate distribution spanning the stratum and corona radiata. No associated FLAIR changes. No chronic ischemia, atrophy, or hydrocephalus. 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  IMPRESSION: Mildly restricted diffusion without FLAIR changes with a right lateral lenticulostriate distribution.   Electronically Signed   By: 08/05/21 M.D.   On: 06/04/2021 06:04   CT HEAD WO CONTRAST   Narrative   CLINICAL DATA:  Stroke follow-up.  Change in mental status after tPA  EXAM: CT HEAD WITHOUT CONTRAST  TECHNIQUE: Contiguous axial images were obtained from the base of the skull through the vertex without intravenous contrast.  COMPARISON:  Head CT from earlier today  FINDINGS: Brain: No hemorrhage, hydrocephalus, or swelling. Subtle areas of low-density at the right caudate and posterior putamen, areas of restricted diffusion by prior MRI.  Vascular: No hyperdense vessel.  Skull: Normal. Negative for fracture or focal lesion.  Sinuses/Orbits: No acute finding.  IMPRESSION: No developments since prior MRI.  No intracranial hemorrhage.   Electronically Signed   By: Jonathon  Watts M.D.   On: 06/04/2021 09:13     PHYSICAL EXAM  General exam   Pleasant obese young African-American lady who appears quite tearful and emotional  HEENT-  Normocephalic, no lesions, without obvious abnormality.  Normal external eye and conjunctiva.   Cardiovascular- RRR, on telemetry Lungs-breathing comfortably on room air Abdomen- soft Musculoskeletal-no obvious joint tenderness, deformity or swelling Skin-warm and dry, no hyperpigmentation, vitiligo, or  suspicious lesions  Neurologic Exam: General: Pleasant middle-aged African-American lady.  Not in distress.   Mental Status: Alert, oriented, thought content appropriate.  Speech mildly dysarthric without evidence of aphasia.  Able to follow 3 step commands without difficulty. Cranial Nerves: II:  Visual fields grossly normal, PERRL III,IV, VI: ptosis not present, extra-ocular motions intact bilaterally V,VII: Moderate left lower facial weakness.  T, light touch sensation intact bilaterally VIII: hearing intact bilaterally XII: tongue extension deviated to the left, without atrophy or fasciculations  Motor: Right : Upper extremity   5/5  Left:     Upper extremity   1/5  Lower extremity   5/5   Lower extremity   2/5 hip, 2/5 ankle Tone and bulk:normal tone throughout; no atrophy noted Sensory: Light touch intact throughout, bilaterally Gait: Deferred   ASSESSMENT/PLAN [name] Costa is a 40 y.o. female with history of anxiety and high blood pressure who presented to [facility] this morning with complaints of left-sided weakness and left facial droop. MR brain showed mildly restricted diffusion without FLAIR changes with a right lateral lenticulostriate distribution. [name] was LKW 6 hours prior to arrival and was provided TPA due to a DWI/FLAIR mismatch and after a risk/benefit conversation with the patient.    # Mildly restricted diffusion without FLAIR changes with a right lateral lenticulostriate distribution.  Etiology likely cryptogenic as it appears to be quite large for lacunar infarct Unclear etiology at this time. Known risk factors at this time include uncontrolled HBP, PCOS but isn't on hormonal treatment. Will continue with stroke workup. 2D Echo pending LDL pending - add statin if LDL >70 HgbA1c pending UDS positive for marijuana TCD bubble study negative for PFO Passed barium swallow today, Dysphagia 3 diet ordered. for VTE prophylaxis No antithrombotic prior to  admission; start DAPT today x  90 days, aspirin 81mg thereafter Therapy recommendations: CIR  Hypertension Currently on cleviprex. Switch to norvasc when cleared for PO Permissive hypertension (OK if < 220/120) but gradually normalize in 5-7 days Long-term BP goal normotensive  Hospital day # 1  Jessica Williams, MSN, NP-C [facility] [phone]  I have personally obtained history,examined this patient, reviewed notes, independently viewed imaging studies, participated in medical decision making and plan of care.ROS completed by me personally and pertinent positives fully documented  I have made any additions or clarifications directly to the above note. Agree with note above.  Patient was counseled to quit marijuana.  Transfer to neurology floor bed today.  Check TEE tomorrow.  Follow anticardiolipin antibodies.  Likely transfer to inpatient rehab in a few days.  Greater than 50% time during this 35-minute visit was spent on counseling and coordination of care and discussion with care team. Rosabella Edgin, MD Medical Director [facility] Pager: [phone] 06/05/2021 2:33 PM   06/05/2021 8:23 AM  To contact Stroke Continuity provider, please refer to [email or web address]. After hours, contact General Neurology  

## 2021-06-05 NOTE — Progress Notes (Signed)
Modified Barium Swallow Progress Note  Patient Details  Name: [name] Costa MRN: [id number] Date of Birth: Jun 09, 1981  Today's Date: 06/05/2021  Modified Barium Swallow completed.  Full report located under Chart Review in the Imaging Section.  Brief recommendations include the following:  Clinical Impression  Pt demonstrated minimal oropharyngeal dysphagia with adequate protection of airway. Cohesion and control were adequate for thin and nectar liquids and mastication and transit of cracker required increased effort due to CN VII and XII impairments. Onset of multilevel laryngeal protection, ROM was coordinated with sequential straw sips thin. Incomplete pharyngeal clearance after thin remaining in vallecuale for 3 seconds before sensing and clearing with spontaneous sub swallow. Coordination and protection adequate with multi consistency of pill and thin. Pill hesitated at GE junction and entered stomach with additional sip thin. Recommend Dys 3 (pt preferred initate chopped meats), thin, pills with thin, straws allowed, small sips and ST continuation for progression of solids.   Swallow Evaluation Recommendations       SLP Diet Recommendations: Dysphagia 3 (Mech soft) solids;Thin liquid   Liquid Administration via: Straw;Cup   Medication Administration: Whole meds with liquid   Supervision: Patient able to self feed;Intermittent supervision to cue for compensatory strategies   Compensations: Slow rate;Small sips/bites   Postural Changes: Seated upright at 90 degrees   Oral Care Recommendations: Oral care BID        Royce Macadamia 06/05/2021,11:52 AM  Breck Coons Lonell Face.Ed Nurse, children's [phone] Office [phone]

## 2021-06-05 NOTE — Progress Notes (Signed)
TCD w/ bubble study completed.   Please see CV Proc for preliminary results.   Nirali Magouirk, RDMS, RVT  

## 2021-06-05 NOTE — Progress Notes (Signed)
Inpatient Rehab Admissions Coordinator Note:   Per therapy recommendations, pt was screened for CIR candidacy by Francetta Ilg, MS CCC-SLP. At this time, Pt. Appears to have functional decline and is a potential candidate for CIR. Will request order for rehab consult per protocol.  Please contact me with questions.   Dartha Rozzell, MS, CCC-SLP Rehab Admissions Coordinator  [phone] (celll) [phone] (office)   

## 2021-06-06 ENCOUNTER — Encounter (HOSPITAL_COMMUNITY)
Admission: EM | Disposition: A | Discharge status: Rehabilitation Facility | Payer: Self-pay | Source: Home / Self Care | Attending: Neurology

## 2021-06-06 ENCOUNTER — Inpatient Hospital Stay (HOSPITAL_COMMUNITY): Payer: BC Managed Care – PPO

## 2021-06-06 ENCOUNTER — Inpatient Hospital Stay (HOSPITAL_COMMUNITY): Payer: BC Managed Care – PPO | Admitting: Certified Registered Nurse Anesthetist

## 2021-06-06 ENCOUNTER — Encounter (HOSPITAL_COMMUNITY): Payer: Self-pay | Admitting: Neurology

## 2021-06-06 HISTORY — PX: BUBBLE STUDY: SHX6837

## 2021-06-06 HISTORY — PX: TEE WITHOUT CARDIOVERSION: SHX5443

## 2021-06-06 LAB — ANA W/REFLEX IF POSITIVE: Anti Nuclear Antibody (ANA): NEGATIVE

## 2021-06-06 SURGERY — ECHOCARDIOGRAM, TRANSESOPHAGEAL
Anesthesia: Monitor Anesthesia Care

## 2021-06-06 MED ORDER — PROPOFOL 500 MG/50ML IV EMUL
INTRAVENOUS | Status: DC | PRN
  Administered 2021-06-06: 125 ug/kg/min via INTRAVENOUS

## 2021-06-06 MED ORDER — SODIUM CHLORIDE 0.9 % IV SOLN: INTRAVENOUS | Status: DC

## 2021-06-06 MED ORDER — LIDOCAINE 2% (20 MG/ML) 5 ML SYRINGE
INTRAMUSCULAR | Status: DC | PRN
  Administered 2021-06-06: 60 mg via INTRAVENOUS

## 2021-06-06 MED ORDER — PROPOFOL 10 MG/ML IV BOLUS
INTRAVENOUS | Status: DC | PRN
  Administered 2021-06-06 (×5): 20 mg via INTRAVENOUS

## 2021-06-06 MED ORDER — LACTATED RINGERS IV SOLN: INTRAVENOUS | Status: DC | PRN

## 2021-06-06 MED ORDER — ONDANSETRON HCL 4 MG/2ML IJ SOLN
INTRAMUSCULAR | Status: DC | PRN
  Administered 2021-06-06: 4 mg via INTRAVENOUS

## 2021-06-06 MED ORDER — BUTAMBEN-TETRACAINE-BENZOCAINE 2-2-14 % EX AERO
INHALATION_SPRAY | CUTANEOUS | Status: DC | PRN
  Administered 2021-06-06: 2 via TOPICAL

## 2021-06-06 NOTE — Progress Notes (Signed)
STROKE TEAM PROGRESS NOTE   SUBJECTIVE (INTERVAL HISTORY) She is doing a lot better today and is now able to lift left upper extremity against gravity and make a good grip with only mild residual left hemiparesis.  Speech and facial weakness have also improved.  She is scheduled to undergo TEE today. Vital signs are stable.  Vasculitic labs all negative IgM anticardiolipin antibodies elevated significantly titer of 108 patient denies any history of recurrent miscarriages and she does not have any thrombocytopenia.  There is no prior history of DVT or PE OBJECTIVE Vitals:   06/06/21 1104 06/06/21 1114 06/06/21 1130 06/06/21 1200  BP: 137/84 (!) 146/79 (!) 145/94 (!) 153/102  Pulse: (!) 101 79 80 80  Resp: (!) 23 (!) 23 20 20   Temp:   98.2 F (36.8 C)   TempSrc:   Oral   SpO2: 96% 97% 97% 98%  Weight:      Height:        CBC:  Recent Labs  Lab 06/04/21 0540 06/04/21 0555  WBC 11.1*  --   NEUTROABS 6.1  --   HGB 10.5* 11.6*  HCT 34.4* 34.0*  MCV 81.3  --   PLT 248  --     Basic Metabolic Panel:  Recent Labs  Lab 06/04/21 0540 06/04/21 0555  NA 136 138  K 3.6 3.5  CL 106 106  CO2 21*  --   GLUCOSE 112* 106*  BUN 10 11  CREATININE 0.74 0.70  CALCIUM 8.5*  --     Lipid Panel:  Recent Labs  Lab 06/05/21 0150  CHOL 188  TRIG 88  HDL 55  CHOLHDL 3.4  VLDL 18  LDLCALC 08/06/21*   HgbA1c:  Lab Results  Component Value Date   HGBA1C 5.4 06/05/2021   Urine Drug Screen:     Component Value Date/Time   LABOPIA NONE DETECTED 06/04/2021 0940   COCAINSCRNUR NONE DETECTED 06/04/2021 0940   LABBENZ NONE DETECTED 06/04/2021 0940   AMPHETMU NONE DETECTED 06/04/2021 0940   THCU POSITIVE (A) 06/04/2021 0940   LABBARB NONE DETECTED 06/04/2021 0940    Alcohol Level No results found for: [facility]  IMAGING  Results for orders placed or performed during the hospital encounter of 06/04/21  MR BRAIN WO CONTRAST   Narrative   CLINICAL DATA:  Left facial droop  EXAM: MRI HEAD  WITHOUT CONTRAST  TECHNIQUE: Multiplanar, multiecho pulse sequences of the brain and surrounding structures were obtained without intravenous contrast.  COMPARISON:  Head CT and CTA from earlier today  FINDINGS: Brain: Truncated study in the emergent setting. There is a wedge of mildly restricted diffusion in the right lateral lenticulostriate distribution spanning the stratum and corona radiata. No associated FLAIR changes. No chronic ischemia, atrophy, or hydrocephalus. 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  IMPRESSION: Mildly restricted diffusion without FLAIR changes with a right lateral lenticulostriate distribution.   Electronically Signed   By: 08/05/21 M.D.   On: 06/04/2021 06:04   CT HEAD WO CONTRAST   Narrative   CLINICAL DATA:  Stroke follow-up.  Change in mental status after tPA  EXAM: CT HEAD WITHOUT CONTRAST  TECHNIQUE: Contiguous axial images were obtained from the base of the skull through the vertex without intravenous contrast.  COMPARISON:  Head CT from earlier today  FINDINGS: Brain: No hemorrhage, hydrocephalus,  or swelling. Subtle areas of low-density at the right caudate and posterior putamen, areas of restricted diffusion by prior MRI.  Vascular: No hyperdense vessel.  Skull: Normal. Negative for fracture or focal lesion.  Sinuses/Orbits: No acute finding.  IMPRESSION: No developments since prior MRI.  No intracranial hemorrhage.   Electronically Signed   By: Marnee Spring M.D.   On: 06/04/2021 09:13     PHYSICAL EXAM  General exam   Pleasant obese young African-American lady who appears not in distress HEENT-  Normocephalic, no lesions, without obvious abnormality.  Normal external eye and conjunctiva.    Cardiovascular- RRR, on telemetry Lungs-breathing comfortably on room air Abdomen- soft Musculoskeletal-no obvious joint tenderness, deformity or swelling Skin-warm and dry, no hyperpigmentation, vitiligo, or suspicious lesions  Neurologic Exam: General: Pleasant middle-aged African-American lady.  Not in distress.   Mental Status: Alert, oriented, thought content appropriate.  Speech mildly dysarthric without evidence of aphasia.  Able to follow 3 step commands without difficulty. Cranial Nerves: II:  Visual fields grossly normal, PERRL III,IV, VI: ptosis not present, extra-ocular motions intact bilaterally V,VII: Moderate left lower facial weakness.   , light touch sensation intact bilaterally VIII: hearing intact bilaterally XII: tongue extension deviated to the left, without atrophy or fasciculations  Motor: Right : Upper extremity   5/5  Left:     Upper extremity   3/5  Lower extremity   5/5   Lower extremity   3/5 hip, 2/5 ankle.  Diminished fine finger movements on the left.  Mild left grip weakness.  Orbits right over left upper extremity. Tone and bulk:normal tone throughout; no atrophy noted Sensory: Light touch intact throughout, bilaterally Gait: Deferred   ASSESSMENT/PLAN [name] Costa is a 40 y.o. female with history of anxiety and high blood pressure who presented to [facility] this morning with complaints of left-sided weakness and left facial droop. MR brain showed mildly restricted diffusion without FLAIR changes with a right lateral lenticulostriate distribution. [name] Costa was LKW 6 hours prior to arrival and was provided TPA due to a DWI/FLAIR mismatch and after a risk/benefit conversation with the patient.    # Mildly restricted diffusion without FLAIR changes with a right lateral lenticulostriate distribution.  Etiology likely cryptogenic as it appears to be quite large for lacunar infarct Unclear etiology at this time. Known risk factors at this time include  uncontrolled HBP, PCOS but isn't on hormonal treatment. Will continue with stroke workup. 2D Echo ejection fraction 65 to 70%.  No wall motion abnormalities.  Negative agitated saline contrast bubble study. LDL 105 mg percent- added statin   HgbA1c 5.4 UDS positive for marijuana TCD bubble study negative for PFO Passed barium swallow today, Dysphagia 3 diet ordered. for VTE prophylaxis No antithrombotic prior to admission; start DAPT today x  90 days, aspirin 81mg  thereafter Therapy recommendations: CIR  Hypertension Currently on cleviprex. Switch to norvasc when cleared for PO Permissive hypertension (OK if < 220/120) but gradually normalize in 5-7 days Long-term BP goal normotensive  Hospital day # 2    And statin for elevated LDL.  TEE later today to look for cardiac source of embolism.  Isolated elevated IgM antibody for anticardiolipin antibody in the absence of clinical history may be an acute phase reactant and will need to be repeated in 3 months.  Patient was counseled to quit marijuana.     .  Likely transfer to inpatient rehab in a few days.  Greater than 50% time during this 25-minute visit  was spent on counseling and coordination of care and discussion with care team. Delia Heady, MD Medical Director [facility] Pager: [phone] 06/06/2021 1:51 PM   06/06/2021 1:51 PM  To contact Stroke Continuity provider, please refer to [email or web address]. After hours, contact General Neurology

## 2021-06-06 NOTE — CV Procedure (Signed)
     TRANSESOPHAGEAL ECHOCARDIOGRAM   NAME:  [name] Costa   MRN: [id number] DOB:  Mar 14, 1981   ADMIT DATE: 06/04/2021  INDICATIONS: CVA  PROCEDURE:   Informed consent was obtained prior to the procedure. The risks, benefits and alternatives for the procedure were discussed and the patient comprehended these risks.  Risks include, but are not limited to, cough, sore throat, vomiting, nausea, somnolence, esophageal and stomach trauma or perforation, bleeding, low blood pressure, aspiration, pneumonia, infection, trauma to the teeth and death.    After a procedural time-out, the oropharynx was anesthetized and the patient was sedated by the anesthesia service. The transesophageal probe was inserted in the esophagus and stomach without difficulty and multiple views were obtained. Anesthesia was monitored by Lelon Perla, CRNA and Dr Okey Dupre.    COMPLICATIONS:    There were no immediate complications.  FINDINGS:  No thrombus or vegetation seen.  Positive bubble study   Epifanio Lesches MD [facility]  [address], [address] [place], [place] [address] [phone]   10:46 AM

## 2021-06-06 NOTE — Transfer of Care (Signed)
Immediate Anesthesia Transfer of Care Note  Patient: Suhana L Hashem  Procedure(s) Performed: TRANSESOPHAGEAL ECHOCARDIOGRAM (TEE) BUBBLE STUDY  Patient Location: Endoscopy Unit  Anesthesia Type:MAC  Level of Consciousness: awake, alert  and oriented  Airway & Oxygen Therapy: Patient Spontanous Breathing  Post-op Assessment: Report given to RN and Post -op Vital signs reviewed and stable  Post vital signs: Reviewed and stable  Last Vitals:  Vitals Value Taken Time  BP 171/96 06/06/21 1053  Temp    Pulse 133 06/06/21 1054  Resp 22 06/06/21 1054  SpO2 95 % 06/06/21 1054  Vitals shown include unvalidated device data.  Last Pain:  Vitals:   06/06/21 0945  TempSrc: Oral  PainSc: 0-No pain         Complications: No notable events documented.

## 2021-06-06 NOTE — Interval H&P Note (Signed)
History and Physical Interval Note:  06/06/2021 10:19 AM  [name] Costa  has presented today for surgery, with the diagnosis of STROKE.  The various methods of treatment have been discussed with the patient and family. After consideration of risks, benefits and other options for treatment, the patient has consented to  Procedure(s): TRANSESOPHAGEAL ECHOCARDIOGRAM (TEE) (N/A) as a surgical intervention.  The patient's history has been reviewed, patient examined, no change in status, stable for surgery.  I have reviewed the patient's chart and labs.  Questions were answered to the patient's satisfaction.     [name]

## 2021-06-06 NOTE — Progress Notes (Signed)
Inpatient Rehabilitation Admissions Coordinator   Inpatient rehab consult received. I met with patient at bedside with her brother and sister. We discussed goals and expectations of possible Cir admit She is in agreement but would like to get home asap. I will begin insurance Auth with [employer] for a possible Cir admit.  Danne Baxter, RN, MSN Rehab Admissions Coordinator [phone] 06/06/2021 1:21 PM

## 2021-06-06 NOTE — Anesthesia Preprocedure Evaluation (Signed)
Anesthesia Evaluation  Patient identified by MRN, date of birth, ID band Patient awake    Reviewed: Allergy & Precautions, NPO status , Patient's Chart, lab work & pertinent test results  Airway Mallampati: II  TM Distance: >3 FB Neck ROM: Full    Dental no notable dental hx.    Pulmonary neg pulmonary ROS,    Pulmonary exam normal breath sounds clear to auscultation       Cardiovascular hypertension, Normal cardiovascular exam Rhythm:Regular Rate:Normal  Untreated HTN  1. Left ventricular ejection fraction, by estimation, is 65 to 70%. The  left ventricle has normal function. The left ventricle has no regional  wall motion abnormalities. Left ventricular diastolic parameters were  normal.  2. Right ventricular systolic function is normal. The right ventricular  size is normal.  3. The mitral valve is normal in structure. No evidence of mitral valve  regurgitation. No evidence of mitral stenosis.  4. The aortic valve is tricuspid. Aortic valve regurgitation is not  visualized. No aortic stenosis is present.  5. The inferior vena cava is normal in size with greater than 50%  respiratory variability, suggesting right atrial pressure of 3 mmHg.  6. Agitated saline contrast bubble study was negative, with no evidence  of any interatrial shunt.    Neuro/Psych CVA negative psych ROS   GI/Hepatic negative GI ROS, Neg liver ROS,   Endo/Other  negative endocrine ROS  Renal/GU negative Renal ROS  negative genitourinary   Musculoskeletal negative musculoskeletal ROS (+)   Abdominal   Peds negative pediatric ROS (+)  Hematology negative hematology ROS (+)   Anesthesia Other Findings   Reproductive/Obstetrics negative OB ROS                             Anesthesia Physical Anesthesia Plan  ASA: 3  Anesthesia Plan: MAC   Post-op Pain Management:    Induction: Intravenous  PONV Risk  Score and Plan: 2 and Propofol infusion and Treatment may vary due to age or medical condition  Airway Management Planned: Simple Face Mask  Additional Equipment:   Intra-op Plan:   Post-operative Plan:   Informed Consent: I have reviewed the patients History and Physical, chart, labs and discussed the procedure including the risks, benefits and alternatives for the proposed anesthesia with the patient or authorized representative who has indicated his/her understanding and acceptance.     Dental advisory given  Plan Discussed with: CRNA and Surgeon  Anesthesia Plan Comments:         Anesthesia Quick Evaluation

## 2021-06-06 NOTE — Anesthesia Postprocedure Evaluation (Signed)
Anesthesia Post Note  Patient: [name] Costa  Procedure(s) Performed: TRANSESOPHAGEAL ECHOCARDIOGRAM (TEE) BUBBLE STUDY     Patient location during evaluation: PACU Anesthesia Type: MAC Level of consciousness: awake and alert Pain management: pain level controlled Vital Signs Assessment: post-procedure vital signs reviewed and stable Respiratory status: spontaneous breathing, nonlabored ventilation, respiratory function stable and patient connected to nasal cannula oxygen Cardiovascular status: stable and blood pressure returned to baseline Postop Assessment: no apparent nausea or vomiting Anesthetic complications: no   No notable events documented.  Last Vitals:  Vitals:   06/06/21 1130 06/06/21 1200  BP: (!) 145/94 (!) 153/102  Pulse: 80 80  Resp: 20 20  Temp: 36.8 C   SpO2: 97% 98%    Last Pain:  Vitals:   06/06/21 1130  TempSrc: Oral  PainSc: 0-No pain                 Mercedes Valeriano S

## 2021-06-06 NOTE — Progress Notes (Signed)
  Echocardiogram Echocardiogram Transesophageal has been performed.  Delcie Roch 06/06/2021, 11:46 AM

## 2021-06-06 NOTE — Anesthesia Procedure Notes (Signed)
Procedure Name: MAC Date/Time: 06/06/2021 10:35 AM Performed by: Dorthea Cove, CRNA Pre-anesthesia Checklist: Patient identified, Emergency Drugs available, Suction available, Patient being monitored and Timeout performed Patient Re-evaluated:Patient Re-evaluated prior to induction Oxygen Delivery Method: Nasal cannula Preoxygenation: Pre-oxygenation with 100% oxygen Induction Type: IV induction Placement Confirmation: CO2 detector Dental Injury: Teeth and Oropharynx as per pre-operative assessment

## 2021-06-06 NOTE — Progress Notes (Signed)
Physical Therapy Treatment Patient Details Name: [name] Costa MRN: [id number] DOB: 11/06/1981 Today's Date: 06/06/2021    History of Present Illness pt is a 40 y/o female admitted 7/5 with left sided weakness and L facial droop with trouble walking.  tPA was administered.  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  PMHx:  chole o/w not pertinent.    PT Comments    Focused session on advancing gait independence and distance with noted success as pt was able to ambulate up to ~40 ft with a RW and min-modA. Pt continues to display unequal muscle activation at her L knee, resulting in hyperextension during stance. In addition, she is displaying moments of scissoring gait pattern that place her at high risk for falls. Focused remainder of session on facilitating muscle activation in her L lower extremity and L hand to improve functional use. Educated pt on performing exercises with bil extremities simultaneously to encourage neuromuscular carryover and to visualize her L limbs to improve muscle activation. Pt displays great motivation and willingness to participate, complete a HEP, and improve. Pt agreeable to plan to go to CIR. Will continue to follow acutely.   Follow Up Recommendations  CIR;Supervision/Assistance - 24 hour     Equipment Recommendations  Rolling walker with 5" wheels;3in1 (PT)    Recommendations for Other Services       Precautions / Restrictions Precautions Precautions: Fall Restrictions Weight Bearing Restrictions: No    Mobility  Bed Mobility Overal bed mobility: Needs Assistance Bed Mobility: Supine to Sit;Sit to Supine     Supine to sit: Min guard;HOB elevated Sit to supine: Min guard   General bed mobility comments: Pt managing L leg well without physical assistance to exit/enter from L side of bed, min guard and extra time.    Transfers Overall transfer level: Needs assistance Equipment used: Rolling walker (2  wheeled) Transfers: Sit to/from Stand Sit to Stand: Min assist         General transfer comment: Cues for hand placement on bed, L knee block and minA to power up to stand and steady.  Ambulation/Gait Ambulation/Gait assistance: Mod assist;Min assist Gait Distance (Feet): 40 Feet Assistive device: Rolling walker (2 wheeled) Gait Pattern/deviations: Decreased step length - left;Decreased stance time - left;Decreased weight shift to left;Decreased dorsiflexion - left;Decreased stride length;Trunk flexed;Step-through pattern;Scissoring Gait velocity: reduced Gait velocity interpretation: <1.31 ft/sec, indicative of household ambulator General Gait Details: Pt needing cues for placement and management of RW, good carryover. Pt with tactile cues initially at L quad to facilitate extension during stance, but provided faded feedback with success as pt did not need tactile cues to maintain extension with stance towards final half of gait bout. Pt with hyperextension of L knee though during stance. Poor L foot placement, displaying intermittent scissoring with LOB posteriorly and to L, needing min-modA to recover. Cued pt to aim L foot for anterior L wheel of RW to correct scissoring, success. Cued for heel strike.   Stairs             Wheelchair Mobility    Modified Rankin (Stroke Patients Only) Modified Rankin (Stroke Patients Only) Pre-Morbid Rankin Score: No symptoms Modified Rankin: Moderately severe disability     Balance Overall balance assessment: Needs assistance Sitting-balance support: No upper extremity supported Sitting balance-Leahy Scale: Fair Sitting balance - Comments: Able to maintain sitting balance EOB with supervision.   Standing balance support: Bilateral upper extremity supported;During functional activity Standing balance-Leahy Scale: Poor Standing balance comment: UE  support and physical assistance for standing.                             Cognition Arousal/Alertness: Awake/alert Behavior During Therapy: WFL for tasks assessed/performed Overall Cognitive Status: Impaired/Different from baseline Area of Impairment: Safety/judgement;Awareness;Problem solving                         Safety/Judgement: Decreased awareness of deficits;Decreased awareness of safety Awareness: Emergent Problem Solving: Difficulty sequencing;Requires verbal cues General Comments: Pt with improved memory, following multi-step commands throughout session. Pt understanding her deficits and need for further therapy to get better and go home. Increased time to sequence multi-step tasks.      Exercises General Exercises - Lower Extremity Gluteal Sets: Strengthening;Supine;5 reps;Both (bridges in bed) Long Arc Quad: Strengthening;Left;20 reps;Seated (with eccentric control) Straight Leg Raises: Left;Supine;Other reps (comment);Strengthening (x3) Hip Flexion/Marching: Left;10 reps;Seated;Strengthening Toe Raises: Both;10 reps;Seated;Strengthening Heel Raises: Both;10 reps;Seated;Strengthening Other Exercises Other Exercises: Towel roll in L hand to squeeze to improve grip strength    General Comments        Pertinent Vitals/Pain Pain Assessment: Faces Faces Pain Scale: No hurt Pain Intervention(s): Monitored during session    Home Living                      Prior Function            PT Goals (current goals can now be found in the care plan section) Acute Rehab PT Goals Patient Stated Goal: to go to CIR then home PT Goal Formulation: With patient Time For Goal Achievement: 06/18/21 Potential to Achieve Goals: Good Progress towards PT goals: Progressing toward goals    Frequency    Min 4X/week      PT Plan Current plan remains appropriate    Co-evaluation              AM-PAC PT "6 Clicks" Mobility   Outcome Measure  Help needed turning from your back to your side while in a flat bed without using  bedrails?: A Little Help needed moving from lying on your back to sitting on the side of a flat bed without using bedrails?: A Little Help needed moving to and from a bed to a chair (including a wheelchair)?: A Little Help needed standing up from a chair using your arms (e.g., wheelchair or bedside chair)?: A Little Help needed to walk in hospital room?: A Lot Help needed climbing 3-5 steps with a railing? : Total 6 Click Score: 15    End of Session Equipment Utilized During Treatment: Gait belt Activity Tolerance: Patient tolerated treatment well Patient left: with call bell/phone within reach;in bed;with bed alarm set Nurse Communication: Mobility status PT Visit Diagnosis: Unsteadiness on feet (R26.81);Other abnormalities of gait and mobility (R26.89);Other symptoms and signs involving the nervous system (R29.898);Hemiplegia and hemiparesis;Muscle weakness (generalized) (M62.81);Difficulty in walking, not elsewhere classified (R26.2) Hemiplegia - Right/Left: Left Hemiplegia - caused by: Cerebral infarction;Other cerebrovascular disease     Time: 1628-1702 PT Time Calculation (min) (ACUTE ONLY): 34 min  Charges:  $Gait Training: 8-22 mins $Neuromuscular Re-education: 8-22 mins                     Raymond Gurney, PT, DPT Acute Rehabilitation Services  Pager: [phone] Office: [phone]    Jewel Baize 06/06/2021, 5:14 PM

## 2021-06-06 NOTE — Progress Notes (Signed)
PT Cancellation Note  Patient Details Name: [name] Costa MRN: [id number] DOB: 05-06-1981   Cancelled Treatment:    Reason Eval/Treat Not Completed: Patient at procedure or test/unavailable. Will follow-up as time permits.  Raymond Gurney, PT, DPT Acute Rehabilitation Services  Pager: [phone] Office: [phone]    Jewel Baize 06/06/2021, 10:04 AM

## 2021-06-07 ENCOUNTER — Other Ambulatory Visit: Payer: Self-pay

## 2021-06-07 ENCOUNTER — Encounter (HOSPITAL_COMMUNITY): Payer: Self-pay | Admitting: Physical Medicine and Rehabilitation

## 2021-06-07 ENCOUNTER — Inpatient Hospital Stay (HOSPITAL_COMMUNITY)
Admission: RE | Admit: 2021-06-07 | Discharge: 2021-06-13 | DRG: 057 | Disposition: A | Discharge status: Home / Self Care | Payer: BC Managed Care – PPO | Source: Intra-hospital | Attending: Physical Medicine and Rehabilitation | Admitting: Physical Medicine and Rehabilitation

## 2021-06-07 DIAGNOSIS — I639 Cerebral infarction, unspecified: Secondary | ICD-10-CM | POA: Diagnosis present

## 2021-06-07 DIAGNOSIS — Z91040 Latex allergy status: Secondary | ICD-10-CM | POA: Diagnosis not present

## 2021-06-07 DIAGNOSIS — E785 Hyperlipidemia, unspecified: Secondary | ICD-10-CM | POA: Diagnosis present

## 2021-06-07 DIAGNOSIS — E7800 Pure hypercholesterolemia, unspecified: Secondary | ICD-10-CM

## 2021-06-07 DIAGNOSIS — Q211 Atrial septal defect: Secondary | ICD-10-CM

## 2021-06-07 DIAGNOSIS — I10 Essential (primary) hypertension: Secondary | ICD-10-CM | POA: Diagnosis present

## 2021-06-07 DIAGNOSIS — I69392 Facial weakness following cerebral infarction: Secondary | ICD-10-CM

## 2021-06-07 DIAGNOSIS — I69354 Hemiplegia and hemiparesis following cerebral infarction affecting left non-dominant side: Secondary | ICD-10-CM | POA: Diagnosis present

## 2021-06-07 LAB — BASIC METABOLIC PANEL
Anion gap: 9 (ref 5–15)
BUN: 16 mg/dL (ref 6–20)
CO2: 22 mmol/L (ref 22–32)
Calcium: 9.2 mg/dL (ref 8.9–10.3)
Chloride: 104 mmol/L (ref 98–111)
Creatinine, Ser: 0.77 mg/dL (ref 0.44–1.00)
GFR, Estimated: >60 mL/min (ref >60)
Glucose, Bld: 91 mg/dL (ref 70–99)
Potassium: 3.7 mmol/L (ref 3.5–5.1)
Sodium: 135 mmol/L (ref 135–145)

## 2021-06-07 LAB — CBC
HCT: 36.8 % (ref 36.0–46.0)
Hemoglobin: 11.6 g/dL — ABNORMAL LOW (ref 12.0–15.0)
MCH: 24.6 pg — ABNORMAL LOW (ref 26.0–34.0)
MCHC: 31.5 g/dL (ref 30.0–36.0)
MCV: 78.1 fL — ABNORMAL LOW (ref 80.0–100.0)
Platelets: 294 10*3/uL (ref 150–400)
RBC: 4.71 MIL/uL (ref 3.87–5.11)
RDW: 17.7 % — ABNORMAL HIGH (ref 11.5–15.5)
WBC: 10.3 10*3/uL (ref 4.0–10.5)
nRBC: 0 % (ref 0.0–0.2)

## 2021-06-07 MED ORDER — ASPIRIN 81 MG PO CHEW: 81.0000 mg | CHEWABLE_TABLET | Freq: Every day | ORAL | 0 refills | Status: DC

## 2021-06-07 MED ORDER — ACETAMINOPHEN 325 MG PO TABS
650.0000 mg | ORAL_TABLET | ORAL | 0 refills | Status: DC | PRN
Start: 2021-06-07 — End: 2023-11-05

## 2021-06-07 MED ORDER — PANTOPRAZOLE SODIUM 40 MG PO TBEC: 40.0000 mg | DELAYED_RELEASE_TABLET | Freq: Every day | ORAL | Status: DC

## 2021-06-07 MED ORDER — ACETAMINOPHEN 650 MG RE SUPP
650.0000 mg | RECTAL | Status: DC | PRN
Start: 2021-06-07 — End: 2021-06-13

## 2021-06-07 MED ORDER — ATORVASTATIN CALCIUM 40 MG PO TABS
40.0000 mg | ORAL_TABLET | Freq: Every day | ORAL | Status: DC
  Administered 2021-06-08 – 2021-06-13 (×6): 40 mg via ORAL
  Filled 2021-06-07 (×6): qty 1

## 2021-06-07 MED ORDER — CLOPIDOGREL BISULFATE 75 MG PO TABS: 75.0000 mg | ORAL_TABLET | Freq: Every day | ORAL | 0 refills | Status: DC

## 2021-06-07 MED ORDER — ASPIRIN 300 MG RE SUPP
300.0000 mg | Freq: Every day | RECTAL | Status: DC
  Filled 2021-06-07: qty 1

## 2021-06-07 MED ORDER — CLOPIDOGREL BISULFATE 75 MG PO TABS
75.0000 mg | ORAL_TABLET | Freq: Every day | ORAL | Status: DC
  Administered 2021-06-08 – 2021-06-13 (×6): 75 mg via ORAL
  Filled 2021-06-07 (×6): qty 1

## 2021-06-07 MED ORDER — PANTOPRAZOLE SODIUM 40 MG PO TBEC
40.0000 mg | DELAYED_RELEASE_TABLET | Freq: Every day | ORAL | Status: DC
  Administered 2021-06-07 – 2021-06-12 (×6): 40 mg via ORAL
  Filled 2021-06-07 (×6): qty 1

## 2021-06-07 MED ORDER — ACETAMINOPHEN 325 MG PO TABS
650.0000 mg | ORAL_TABLET | ORAL | Status: DC | PRN
  Administered 2021-06-10: 650 mg via ORAL
  Filled 2021-06-07: qty 2

## 2021-06-07 MED ORDER — ATORVASTATIN CALCIUM 40 MG PO TABS: 40.0000 mg | ORAL_TABLET | Freq: Every day | ORAL | 0 refills | Status: DC

## 2021-06-07 MED ORDER — SENNOSIDES-DOCUSATE SODIUM 8.6-50 MG PO TABS: 1.0000 | ORAL_TABLET | Freq: Every evening | ORAL | Status: DC | PRN

## 2021-06-07 MED ORDER — ASPIRIN 81 MG PO CHEW
81.0000 mg | CHEWABLE_TABLET | Freq: Every day | ORAL | Status: DC
  Administered 2021-06-08 – 2021-06-13 (×6): 81 mg via ORAL
  Filled 2021-06-07 (×6): qty 1

## 2021-06-07 MED ORDER — ATORVASTATIN CALCIUM 40 MG PO TABS
40.0000 mg | ORAL_TABLET | Freq: Every day | ORAL | Status: DC
  Administered 2021-06-07: 40 mg via ORAL
  Filled 2021-06-07: qty 1

## 2021-06-07 MED ORDER — SENNOSIDES-DOCUSATE SODIUM 8.6-50 MG PO TABS: 1.0000 | ORAL_TABLET | Freq: Every evening | ORAL | 0 refills | Status: DC | PRN

## 2021-06-07 MED ORDER — ACETAMINOPHEN 160 MG/5ML PO SOLN: 650.0000 mg | ORAL | Status: DC | PRN

## 2021-06-07 NOTE — Progress Notes (Signed)
Client discharged and transferred to [facility] with belongings (shoes, bag, cell phone and charger, stroke education documents). Staff at bedside to admit patient.

## 2021-06-07 NOTE — Discharge Summary (Addendum)
Stroke Discharge Summary  Patient ID: [name] Costa   MRN: [id number]      DOB: Oct 24, 1981  Date of Admission: 06/04/2021 Date of Discharge: 06/07/2021  Attending Physician:  Stroke, Md, MD, Stroke MD Consultant(s):   rehabilitation medicine Patient's PCP:  Patient, No Pcp Per (Inactive)  Discharge Diagnoses: CVA; cryptogenic  Active Problems:   Stroke - Right BG infarct   HTN   THC abuse   PFO   HLD   PCOS   Medications to be continued on Rehab Allergies as of 06/07/2021       Reactions   Latex Rash        Medication List     TAKE these medications    acetaminophen 325 MG tablet Commonly known as: TYLENOL Take 2 tablets (650 mg total) by mouth every 4 (four) hours as needed for mild pain (or temp > 37.5 C (99.5 F)).   aspirin 81 MG chewable tablet Chew 1 tablet (81 mg total) by mouth daily. Start taking on: June 08, 2021   atorvastatin 40 MG tablet Commonly known as: LIPITOR Take 1 tablet (40 mg total) by mouth daily. Start taking on: June 08, 2021   clopidogrel 75 MG tablet Commonly known as: PLAVIX Take 1 tablet (75 mg total) by mouth daily. Start taking on: June 08, 2021   senna-docusate 8.6-50 MG tablet Commonly known as: Senokot-S Take 1 tablet by mouth at bedtime as needed for mild constipation or moderate constipation.        LABORATORY STUDIES CBC    Component Value Date/Time   WBC 10.3 06/07/2021 0448   RBC 4.71 06/07/2021 0448   HGB 11.6 (L) 06/07/2021 0448   HCT 36.8 06/07/2021 0448   PLT 294 06/07/2021 0448   MCV 78.1 (L) 06/07/2021 0448   MCH 24.6 (L) 06/07/2021 0448   MCHC 31.5 06/07/2021 0448   RDW 17.7 (H) 06/07/2021 0448   LYMPHSABS 4.2 (H) 06/04/2021 0540   MONOABS 0.5 06/04/2021 0540   EOSABS 0.2 06/04/2021 0540   BASOSABS 0.0 06/04/2021 0540   CMP    Component Value Date/Time   NA 135 06/07/2021 0448   K 3.7 06/07/2021 0448   CL 104 06/07/2021 0448   CO2 22 06/07/2021 0448   GLUCOSE 91 06/07/2021 0448   BUN 16  06/07/2021 0448   CREATININE 0.77 06/07/2021 0448   CALCIUM 9.2 06/07/2021 0448   PROT 6.0 (L) 06/04/2021 0540   ALBUMIN 3.0 (L) 06/04/2021 0540   AST 15 06/04/2021 0540   ALT 10 06/04/2021 0540   ALKPHOS 70 06/04/2021 0540   BILITOT 0.5 06/04/2021 0540   GFRNONAA >60 06/07/2021 0448   COAGS Lab Results  Component Value Date   INR 1.0 06/04/2021   Lipid Panel    Component Value Date/Time   CHOL 188 06/05/2021 0150   TRIG 88 06/05/2021 0150   HDL 55 06/05/2021 0150   CHOLHDL 3.4 06/05/2021 0150   VLDL 18 06/05/2021 0150   LDLCALC 115 (H) 06/05/2021 0150   HgbA1C  Lab Results  Component Value Date   HGBA1C 5.4 06/05/2021   Urinalysis    Component Value Date/Time   LABSPEC 1.020 01/13/2009 1417   PHURINE 7.0 01/13/2009 1417   GLUCOSEU NEGATIVE 01/13/2009 1417   HGBUR TRACE (A) 01/13/2009 1417   BILIRUBINUR NEGATIVE 01/13/2009 1417   KETONESUR NEGATIVE 01/13/2009 1417   PROTEINUR 30 (A) 01/13/2009 1417   UROBILINOGEN 1.0 01/13/2009 1417   NITRITE POSITIVE (A) 01/13/2009 1417  LEUKOCYTESUR (A) 01/13/2009 1417    SMALL Biochemical Testing Only. Please order routine urinalysis from main lab if confirmatory testing is needed.   Urine Drug Screen     Component Value Date/Time   LABOPIA NONE DETECTED 06/04/2021 0940   COCAINSCRNUR NONE DETECTED 06/04/2021 0940   LABBENZ NONE DETECTED 06/04/2021 0940   AMPHETMU NONE DETECTED 06/04/2021 0940   THCU POSITIVE (A) 06/04/2021 0940   LABBARB NONE DETECTED 06/04/2021 0940    Alcohol Level No results found for: ETH   SIGNIFICANT DIAGNOSTIC STUDIES CT HEAD WO CONTRAST  Result Date: 06/05/2021 CLINICAL DATA:  Stroke follow-up EXAM: CT HEAD WITHOUT CONTRAST TECHNIQUE: Contiguous axial images were obtained from the base of the skull through the vertex without intravenous contrast. COMPARISON:  Multiple studies from yesterday FINDINGS: Brain: Right sided perforator infarct spanning the basal ganglia and corona radiata that has  become readily apparent by CT. The extent is unchanged from prior brain MRI. No hemorrhagic conversion or evidence of new infarction. Vascular: No hyperdense vessel or unexpected calcification. Skull: Ground-glass right parietal bone lesion as previously described. Sinuses/Orbits: Retention cyst in the right maxillary sinus IMPRESSION: Acute perforator infarct at the right basal ganglia matching the prior brain MRI. No hemorrhagic conversion. Electronically Signed   By: Marnee Spring M.D.   On: 06/05/2021 04:31   CT HEAD WO CONTRAST  Result Date: 06/04/2021 CLINICAL DATA:  Stroke follow-up.  Change in mental status after tPA EXAM: CT HEAD WITHOUT CONTRAST TECHNIQUE: Contiguous axial images were obtained from the base of the skull through the vertex without intravenous contrast. COMPARISON:  Head CT from earlier today FINDINGS: Brain: No hemorrhage, hydrocephalus, or swelling. Subtle areas of low-density at the right caudate and posterior putamen, areas of restricted diffusion by prior MRI. Vascular: No hyperdense vessel. Skull: Normal. Negative for fracture or focal lesion. Sinuses/Orbits: No acute finding. IMPRESSION: No developments since prior MRI.  No intracranial hemorrhage. Electronically Signed   By: Marnee Spring M.D.   On: 06/04/2021 09:13   MR BRAIN WO CONTRAST  Result Date: 06/04/2021 CLINICAL DATA:  Left facial droop EXAM: MRI HEAD WITHOUT CONTRAST TECHNIQUE: Multiplanar, multiecho pulse sequences of the brain and surrounding structures were obtained without intravenous contrast. COMPARISON:  Head CT and CTA from earlier today FINDINGS: Brain: Truncated study in the emergent setting. There is a wedge of mildly restricted diffusion in the right lateral lenticulostriate distribution spanning the stratum and corona radiata. No associated FLAIR changes. No chronic ischemia, atrophy, or hydrocephalus. 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 IMPRESSION: Mildly restricted diffusion without FLAIR changes with a right lateral lenticulostriate distribution. Electronically Signed   By: Marnee Spring M.D.   On: 06/04/2021 06:04   DG Swallowing Func-Speech Pathology  Result Date: 06/05/2021 Formatting of this result is different from the original. Objective Swallowing Evaluation: Type of Study: MBS-Modified Barium Swallow Study  Patient Details Name: [name] Costa MRN: [id number] Date of Birth: 1981/09/25 Today's Date: 06/05/2021 Time: SLP Start Time (ACUTE ONLY): 0916 -SLP Stop Time (ACUTE ONLY): 0930 SLP Time Calculation (min) (ACUTE ONLY): 14 min Past Medical History: Past Medical History: Diagnosis Date  Bladder infection   Trichomonas  Past Surgical History: Past Surgical History: Procedure Laterality Date  CHOLECYSTECTOMY    CHOLECYSTECTOMY    TUBAL LIGATION Bilateral 04/13/2013  Procedure: POST PARTUM TUBAL LIGATION;  Surgeon: Malachi Pro  Ambrose MantleHenley, MD;  Location: [facility];  Service: Gynecology;  Laterality: Bilateral; HPI: 40 yr old woke up this am and noted gait disturbance, left sided arm/leg and facial weakness. MRI showed mildly restricted diffusion without FLAIR changes with a right lateral lenticulostriate distribution. PMH; UTI  No data recorded Assessment / Plan / Recommendation CHL IP CLINICAL IMPRESSIONS 06/05/2021 Clinical Impression Pt demonstrated minimal oropharyngeal dysphagia with adequate protection of airway. Cohesion and control were adequate for thin and nectar liquids and mastication and transit of cracker required increased effort due to CN VII and XII impairments. Onset of multilevel laryngeal protection, ROM was coordinated with sequential straw sips thin. Incomplete pharyngeal clearance after thin remaining in vallecuale for 3 seconds before sensing and clearing with spontaneous sub  swallow. Coordination and protection adequate with multi consistency of pill and thin. Pill hesitated at GE junction and entered stomach with additional sip thin. Recommend Dys 3 (pt preferred initate chopped meats), thin, pills with thin, straws allowed, small sips and ST continuation for progression of solids. SLP Visit Diagnosis Dysphagia, pharyngeal phase (R13.13) Attention and concentration deficit following -- Frontal lobe and executive function deficit following -- Impact on safety and function Mild aspiration risk   CHL IP TREATMENT RECOMMENDATION 06/05/2021 Treatment Recommendations Therapy as outlined in treatment plan below   Prognosis 06/05/2021 Prognosis for Safe Diet Advancement Good Barriers to Reach Goals -- Barriers/Prognosis Comment -- CHL IP DIET RECOMMENDATION 06/05/2021 SLP Diet Recommendations Dysphagia 3 (Mech soft) solids;Thin liquid Liquid Administration via Straw;Cup Medication Administration Whole meds with liquid Compensations Slow rate;Small sips/bites Postural Changes Seated upright at 90 degrees   CHL IP OTHER RECOMMENDATIONS 06/05/2021 Recommended Consults -- Oral Care Recommendations Oral care BID Other Recommendations --   CHL IP FOLLOW UP RECOMMENDATIONS 06/05/2021 Follow up Recommendations Inpatient Rehab   CHL IP FREQUENCY AND DURATION 06/05/2021 Speech Therapy Frequency (ACUTE ONLY) min 2x/week Treatment Duration 2 weeks      CHL IP ORAL PHASE 06/05/2021 Oral Phase Impaired Oral - Pudding Teaspoon -- Oral - Pudding Cup -- Oral - Honey Teaspoon -- Oral - Honey Cup -- Oral - Nectar Teaspoon -- Oral - Nectar Cup WFL Oral - Nectar Straw -- Oral - Thin Teaspoon -- Oral - Thin Cup WFL Oral - Thin Straw WFL Oral - Puree -- Oral - Mech Soft -- Oral - Regular Delayed oral transit Oral - Multi-Consistency -- Oral - Pill -- Oral Phase - Comment --  CHL IP PHARYNGEAL PHASE 06/05/2021 Pharyngeal Phase Impaired Pharyngeal- Pudding Teaspoon -- Pharyngeal -- Pharyngeal- Pudding Cup -- Pharyngeal -- Pharyngeal-  Honey Teaspoon -- Pharyngeal -- Pharyngeal- Honey Cup -- Pharyngeal -- Pharyngeal- Nectar Teaspoon -- Pharyngeal -- Pharyngeal- Nectar Cup WFL Pharyngeal -- Pharyngeal- Nectar Straw -- Pharyngeal -- Pharyngeal- Thin Teaspoon -- Pharyngeal -- Pharyngeal- Thin Cup Pharyngeal residue - pyriform Pharyngeal -- Pharyngeal- Thin Straw Pharyngeal residue - pyriform Pharyngeal -- Pharyngeal- Puree -- Pharyngeal -- Pharyngeal- Mechanical Soft -- Pharyngeal -- Pharyngeal- Regular WFL Pharyngeal -- Pharyngeal- Multi-consistency -- Pharyngeal -- Pharyngeal- Pill -- Pharyngeal -- Pharyngeal Comment --  CHL IP CERVICAL ESOPHAGEAL PHASE 06/05/2021 Cervical Esophageal Phase WFL Pudding Teaspoon -- Pudding Cup -- Honey Teaspoon -- Honey Cup -- Nectar Teaspoon -- Nectar Cup -- Nectar Straw -- Thin Teaspoon -- Thin Cup -- Thin Straw -- Puree -- Mechanical Soft -- Regular -- Multi-consistency -- Pill -- Cervical Esophageal Comment -- Royce MacadamiaLitaker, Lisa Willis 06/05/2021, 11:52 AM  Breck CoonsLisa Willis Lonell FaceLitaker M.Ed Nurse, children'sCCC-SLP Speech-Language Pathologist Pager [phone] Office [phone]  VAS [place] TRANSCRANIAL DOPPLER W BUBBLES  Result Date: 06/05/2021  Transcranial Doppler with Bubble Patient Name:  [name] Costa  Date of Exam:   06/05/2021 Medical Rec #: [id number]         Accession #:    [id number] Date of Birth: January 24, 1981         Patient Gender: F Patient Age:   40Y Exam Location:  [facility] Procedure:      VAS [place] TRANSCRANIAL Demetrios Isaacs Referring Phys: [id number] PRAMOD S SETHI --------------------------------------------------------------------------------  Indications: Stroke. Comparison Study: No prior studies. Performing Technologist: Jean Rosenthal RDMS,RVT  Examination Guidelines: A complete evaluation includes B-mode imaging, spectral Doppler, color Doppler, and power Doppler as needed of all accessible portions of each vessel. Bilateral testing is considered an integral part of a complete examination. Limited examinations for  reoccurring indications may be performed as noted.  Summary: No HITS at rest or during Valsalva. Negative transcranial Doppler Bubble study with no evidence of right to left intracardiac communication.  A vascular evaluation was performed. The right middle cerebral artery was studied. An IV was inserted into the patient's left forearm. Verbal informed consent was obtained.  *See table(s) above for TCD measurements and observations.    Preliminary    ECHO TEE  Result Date: 06/06/2021    TRANSESOPHOGEAL ECHO REPORT   Patient Name:   [name] Costa Date of Exam: 06/06/2021 Medical Rec #:  [id number]        Height:       67.0 in Accession #:    [id number]       Weight:       224.4 lb Date of Birth:  [birth year]        BSA:          2.124 m Patient Age:    40 years         BP:           141/77 mmHg Patient Gender: F                HR:           127 bpm. Exam Location:  Inpatient Procedure: 2D Echo Indications:    stroke  History:        Patient has no prior history of Echocardiogram examinations.                 Risk Factors:Current Smoker and Hypertension.  Sonographer:    Delcie Roch Referring Phys: [id number] CALLIE E GOODRICH PROCEDURE: After discussion of the risks and benefits of a TEE, an informed consent was obtained from the patient. The transesophogeal probe was passed without difficulty through the esophogus of the patient. Imaged were obtained with the patient in a left lateral decubitus position. Local oropharyngeal anesthetic was provided with Cetacaine. Sedation performed by different physician. The patient was monitored while under deep sedation. Anesthestetic sedation was provided intravenously by Anesthesiology:  of Propofol,  of Lidocaine. The patient developed no complications during the procedure. IMPRESSIONS  1. Left ventricular ejection fraction, by estimation, is 60 to 65%. The left ventricle has normal function.  2. Right ventricular systolic function is normal. The right ventricular  size is normal.  3. No left atrial/left atrial appendage thrombus was detected.  4. The mitral valve is normal in structure. Trivial mitral valve regurgitation.  5. The aortic valve is tricuspid. Aortic valve regurgitation is not visualized. No aortic stenosis is present.  6. Agitated saline contrast bubble study was positive with  shunting observed within 3-6 cardiac cycles suggestive of interatrial shunt. Suggests small patent foramen ovale. FINDINGS  Left Ventricle: Left ventricular ejection fraction, by estimation, is 60 to 65%. The left ventricle has normal function. The left ventricular internal cavity size was normal in size. Right Ventricle: The right ventricular size is normal. No increase in right ventricular wall thickness. Right ventricular systolic function is normal. Left Atrium: Left atrial size was normal in size. No left atrial/left atrial appendage thrombus was detected. Right Atrium: Right atrial size was normal in size. Pericardium: There is no evidence of pericardial effusion. Mitral Valve: The mitral valve is normal in structure. Trivial mitral valve regurgitation. Tricuspid Valve: The tricuspid valve is normal in structure. Tricuspid valve regurgitation is trivial. Aortic Valve: The aortic valve is tricuspid. Aortic valve regurgitation is not visualized. No aortic stenosis is present. Pulmonic Valve: The pulmonic valve was grossly normal. Pulmonic valve regurgitation is trivial. Aorta: The aortic root and ascending aorta are structurally normal, with no evidence of dilitation. IAS/Shunts: No atrial level shunt detected by color flow Doppler. Agitated saline contrast was given intravenously to evaluate for intracardiac shunting. Agitated saline contrast bubble study was positive with shunting observed within 3-6 cardiac cycles suggestive of interatrial shunt. A small patent foramen ovale is detected. Epifanio Lesches MD Electronically signed by Epifanio Lesches MD Signature Date/Time:  06/06/2021/3:51:04 PM    Final    US PELVIC COMPLETE WITH TRANSVAGINAL  Result Date: 05/14/2021 CLINICAL DATA:  Excessive bleeding after age 54, history of PCOS, LMP 04/23/2021 EXAM: TRANSABDOMINAL AND TRANSVAGINAL ULTRASOUND OF PELVIS TECHNIQUE: Both transabdominal and transvaginal ultrasound examinations of the pelvis were performed. Transabdominal technique was performed for global imaging of the pelvis including uterus, ovaries, adnexal regions, and pelvic cul-de-sac. It was necessary to proceed with endovaginal exam following the transabdominal exam to visualize the uterus, endometrium, and ovaries. COMPARISON:  None FINDINGS: Uterus Measurements: 5.0 x 5.4 x 5.1 cm = volume: 128 mL. Anteverted. Heterogeneous myometrium. Few areas of myometrial shadowing. Cannot exclude adenomyosis with this appearance. Nabothian cysts at cervix. No discrete mass. Endometrium Thickness: 4 mm. Question bicornuate morphology. No endometrial mass or fluid. Right ovary Measurements: 2.0 x 2.7 x 3.9 cm. = volume: 11 mL. Normal morphology without mass Left ovary Measurements: 2.5 x 2.6 x 3.6 cm = volume: 12 mL. Normal morphology without mass Other findings No free pelvic fluid.  No adnexal masses. IMPRESSION: Heterogeneous myometrium with scattered areas of myometrial shadowing, cannot exclude adenomyosis with this appearance. Question bicornuate morphology. Remainder of exam unremarkable. Electronically Signed   By: Ulyses Southward M.D.   On: 05/14/2021 09:20   ECHOCARDIOGRAM COMPLETE BUBBLE STUDY  Result Date: 06/04/2021    ECHOCARDIOGRAM REPORT   Patient Name:   [name] Costa Date of Exam: 06/04/2021 Medical Rec #:  [id number]        Height:       67.0 in Accession #:    [id number]       Weight:       224.4 lb Date of Birth:  May 05, 1981        BSA:          2.124 m Patient Age:    40 years         BP:           145/88 mmHg Patient Gender: F                HR:           90 bpm.  Exam Location:  Inpatient Procedure: 2D Echo, Cardiac  Doppler, Color Doppler and Saline Contrast Bubble            Study Indications:    Stroke 434.91 / I63.9  History:        Patient has no prior history of Echocardiogram examinations.  Sonographer:    Renella Cunas RDCS Referring Phys: [id number] [facility] IMPRESSIONS  1. Left ventricular ejection fraction, by estimation, is 65 to 70%. The left ventricle has normal function. The left ventricle has no regional wall motion abnormalities. Left ventricular diastolic parameters were normal.  2. Right ventricular systolic function is normal. The right ventricular size is normal.  3. The mitral valve is normal in structure. No evidence of mitral valve regurgitation. No evidence of mitral stenosis.  4. The aortic valve is tricuspid. Aortic valve regurgitation is not visualized. No aortic stenosis is present.  5. The inferior vena cava is normal in size with greater than 50% respiratory variability, suggesting right atrial pressure of 3 mmHg.  6. Agitated saline contrast bubble study was negative, with no evidence of any interatrial shunt. Comparison(s): No prior Echocardiogram. Conclusion(s)/Recommendation(s): Otherwise normal echocardiogram, with minor abnormalities described in the report. FINDINGS  Left Ventricle: Left ventricular ejection fraction, by estimation, is 65 to 70%. The left ventricle has normal function. The left ventricle has no regional wall motion abnormalities. The left ventricular internal cavity size was normal in size. There is  no left ventricular hypertrophy. Left ventricular diastolic parameters were normal. Right Ventricle: The right ventricular size is normal. No increase in right ventricular wall thickness. Right ventricular systolic function is normal. Left Atrium: Left atrial size was normal in size. Right Atrium: Right atrial size was normal in size. Pericardium: There is no evidence of pericardial effusion. Mitral Valve: The mitral valve is normal in structure. No evidence of mitral valve  regurgitation. No evidence of mitral valve stenosis. Tricuspid Valve: The tricuspid valve is normal in structure. Tricuspid valve regurgitation is not demonstrated. Aortic Valve: The aortic valve is tricuspid. Aortic valve regurgitation is not visualized. No aortic stenosis is present. Pulmonic Valve: The pulmonic valve was normal in structure. Pulmonic valve regurgitation is not visualized. No evidence of pulmonic stenosis. Aorta: The aortic root is normal in size and structure. Venous: The inferior vena cava is normal in size with greater than 50% respiratory variability, suggesting right atrial pressure of 3 mmHg. IAS/Shunts: The atrial septum is grossly normal. Agitated saline contrast was given intravenously to evaluate for intracardiac shunting. Agitated saline contrast bubble study was negative, with no evidence of any interatrial shunt.  LEFT VENTRICLE PLAX 2D LVIDd:         4.80 cm      Diastology LVIDs:         3.30 cm      LV e' medial:    8.16 cm/s LV PW:         0.80 cm      LV E/e' medial:  9.5 LV IVS:        0.80 cm      LV e' lateral:   13.20 cm/s LVOT diam:     1.90 cm      LV E/e' lateral: 5.9 LV SV:         59 LV SV Index:   28 LVOT Area:     2.84 cm  LV Volumes (MOD) LV vol d, MOD A2C: 125.0 ml LV vol d, MOD A4C: 129.0 ml LV vol s, MOD A2C:  46.4 ml LV vol s, MOD A4C: 46.4 ml LV SV MOD A2C:     78.6 ml LV SV MOD A4C:     129.0 ml LV SV MOD BP:      80.1 ml RIGHT VENTRICLE RV S prime:     13.50 cm/s TAPSE (M-mode): 2.4 cm LEFT ATRIUM             Index       RIGHT ATRIUM           Index LA diam:        3.30 cm 1.55 cm/m  RA Area:     13.10 cm LA Vol (A2C):   40.9 ml 19.26 ml/m RA Volume:   31.50 ml  14.83 ml/m LA Vol (A4C):   22.4 ml 10.55 ml/m LA Biplane Vol: 32.6 ml 15.35 ml/m  AORTIC VALVE LVOT Vmax:   97.50 cm/s LVOT Vmean:  70.700 cm/s LVOT VTI:    0.208 m  AORTA Ao Root diam: 2.80 cm MITRAL VALVE MV Area (PHT): 4.04 cm    SHUNTS MV Decel Time: 188 msec    Systemic VTI:  0.21 m MV E  velocity: 77.60 cm/s  Systemic Diam: 1.90 cm MV A velocity: 59.20 cm/s MV E/A ratio:  1.31 Riley Lam MD Electronically signed by Riley Lam MD Signature Date/Time: 06/04/2021/11:11:48 AM    Final    CT HEAD CODE STROKE WO CONTRAST  Result Date: 06/04/2021 CLINICAL DATA:  Code stroke.  Stroke follow up. EXAM: CT HEAD WITHOUT CONTRAST TECHNIQUE: Contiguous axial images were obtained from the base of the skull through the vertex without intravenous contrast. COMPARISON:  None. FINDINGS: Brain: No evidence of acute infarction, hemorrhage, hydrocephalus, extra-axial collection or mass lesion/mass effect. Vascular: No hyperdense vessel or unexpected calcification. Skull: Ground-glass and sclerotic bone lesion with expansion affecting the right parietal bone and measuring approximately 6 cm in diameter. Sinuses/Orbits: No acute finding Other: These results were communicated to Dr Derry Lory at 5:33 am on 06/04/2021 by text page via the [facility] messaging system. ASPECTS [facility] Stroke Program Early CT Score) Not scored without localizing history IMPRESSION: 1. No acute finding. 2. 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 Electronically Signed   By: Marnee Spring M.D.   On: 06/04/2021 05:34   VAS [place] LOWER EXTREMITY VENOUS (DVT)  Result Date: 06/05/2021  Lower Venous DVT Study Patient Name:  [name] Costa  Date of Exam:   06/04/2021 Medical Rec #: [id number]         Accession #:    [id number] Date of Birth: Oct 22, 1981         Patient Gender: F Patient Age:   30Y Exam Location:  [facility] Procedure:      VAS [place] LOWER EXTREMITY VENOUS (DVT) Referring Phys: [id number] JAMIE H ALDRIDGE --------------------------------------------------------------------------------  Indications: Stroke.  Risk Factors: None identified. Limitations: Poor ultrasound/tissue interface. Comparison Study: No prior studies. Performing Technologist: Chanda Busing RVT   Examination Guidelines: A complete evaluation includes B-mode imaging, spectral Doppler, color Doppler, and power Doppler as needed of all accessible portions of each vessel. Bilateral testing is considered an integral part of a complete examination. Limited examinations for reoccurring indications may be performed as noted. The reflux portion of the exam is performed with the patient in reverse Trendelenburg.  +---------+---------------+---------+-----------+----------+--------------+ RIGHT    CompressibilityPhasicitySpontaneityPropertiesThrombus Aging +---------+---------------+---------+-----------+----------+--------------+ CFV      Full           Yes  Yes                                 +---------+---------------+---------+-----------+----------+--------------+ SFJ      Full                                                        +---------+---------------+---------+-----------+----------+--------------+ FV Prox  Full                                                        +---------+---------------+---------+-----------+----------+--------------+ FV Mid   Full           Yes      Yes                                 +---------+---------------+---------+-----------+----------+--------------+ FV DistalFull                                                        +---------+---------------+---------+-----------+----------+--------------+ PFV      Full                                                        +---------+---------------+---------+-----------+----------+--------------+ POP      Full           Yes      Yes                                 +---------+---------------+---------+-----------+----------+--------------+ PTV      Full                                                        +---------+---------------+---------+-----------+----------+--------------+ PERO     Full                                                         +---------+---------------+---------+-----------+----------+--------------+   +---------+---------------+---------+-----------+----------+--------------+ LEFT     CompressibilityPhasicitySpontaneityPropertiesThrombus Aging +---------+---------------+---------+-----------+----------+--------------+ CFV      Full           Yes      Yes                                 +---------+---------------+---------+-----------+----------+--------------+ SFJ      Full                                                        +---------+---------------+---------+-----------+----------+--------------+  FV Prox  Full                                                        +---------+---------------+---------+-----------+----------+--------------+ FV Mid   Full                                                        +---------+---------------+---------+-----------+----------+--------------+ FV DistalFull                                                        +---------+---------------+---------+-----------+----------+--------------+ PFV      Full                                                        +---------+---------------+---------+-----------+----------+--------------+ POP      Full           Yes      Yes                                 +---------+---------------+---------+-----------+----------+--------------+ PTV      Full                                                        +---------+---------------+---------+-----------+----------+--------------+ PERO     Full                                                        +---------+---------------+---------+-----------+----------+--------------+     Summary: RIGHT: - There is no evidence of deep vein thrombosis in the lower extremity.  - No cystic structure found in the popliteal fossa.  LEFT: - There is no evidence of deep vein thrombosis in the lower extremity.  - No cystic structure found in the popliteal fossa.   *See table(s) above for measurements and observations. Electronically signed by Heath Lark on 06/05/2021 at 5:18:30 PM.    Final    CT ANGIO HEAD CODE STROKE  Result Date: 06/04/2021 CLINICAL DATA:  Left-sided weakness EXAM: CT ANGIOGRAPHY HEAD AND NECK TECHNIQUE: Multidetector CT imaging of the head and neck was performed using the standard protocol during bolus administration of intravenous contrast. Multiplanar CT image reconstructions and MIPs were obtained to evaluate the vascular anatomy. Carotid stenosis measurements (when applicable) are obtained utilizing NASCET criteria, using the distal internal carotid diameter as the denominator. CONTRAST:  75mL OMNIPAQUE IOHEXOL 350 MG/ML SOLN COMPARISON:  Head CT from earlier today FINDINGS: CTA NECK FINDINGS Aortic arch: Normal.  Three vessel branching. Right carotid system: Vessels are smooth and widely patent. Left carotid system: Vessels are smooth and widely patent. No atheromatous changes. Vertebral arteries: No proximal subclavian stenosis. Limited assessment of the vertebral arteries due to intravenous contrast reflux on the left more than right. Skeleton: Expansile sclerotic right parietal bone lesion, most consistent with fibrous dysplasia. Scoliosis. Other neck: No acute or aggressive finding in the neck soft tissues. Partial bilateral maxillary sinus opacification Upper chest: Negative Review of the MIP images confirms the above findings CTA HEAD FINDINGS Anterior circulation: Vessels are smooth and widely patent. No branch occlusion, beading, or aneurysm. Posterior circulation: Vessels are smooth and widely patent. No branch occlusion, beading, or aneurysm. Venous sinuses: Unremarkable in the arterial phase Anatomic variants: Hypoplastic left A1 segment and azygos A2 segment. Review of the MIP images confirms the above findings IMPRESSION: No emergent finding.  No stenosis or atheromatous changes. Electronically Signed   By: Marnee Spring M.D.   On:  06/04/2021 06:02   CT ANGIO NECK CODE STROKE  Result Date: 06/04/2021 CLINICAL DATA:  Left-sided weakness EXAM: CT ANGIOGRAPHY HEAD AND NECK TECHNIQUE: Multidetector CT imaging of the head and neck was performed using the standard protocol during bolus administration of intravenous contrast. Multiplanar CT image reconstructions and MIPs were obtained to evaluate the vascular anatomy. Carotid stenosis measurements (when applicable) are obtained utilizing NASCET criteria, using the distal internal carotid diameter as the denominator. CONTRAST:  75mL OMNIPAQUE IOHEXOL 350 MG/ML SOLN COMPARISON:  Head CT from earlier today FINDINGS: CTA NECK FINDINGS Aortic arch: Normal.  Three vessel branching. Right carotid system: Vessels are smooth and widely patent. Left carotid system: Vessels are smooth and widely patent. No atheromatous changes. Vertebral arteries: No proximal subclavian stenosis. Limited assessment of the vertebral arteries due to intravenous contrast reflux on the left more than right. Skeleton: Expansile sclerotic right parietal bone lesion, most consistent with fibrous dysplasia. Scoliosis. Other neck: No acute or aggressive finding in the neck soft tissues. Partial bilateral maxillary sinus opacification Upper chest: Negative Review of the MIP images confirms the above findings CTA HEAD FINDINGS Anterior circulation: Vessels are smooth and widely patent. No branch occlusion, beading, or aneurysm. Posterior circulation: Vessels are smooth and widely patent. No branch occlusion, beading, or aneurysm. Venous sinuses: Unremarkable in the arterial phase Anatomic variants: Hypoplastic left A1 segment and azygos A2 segment. Review of the MIP images confirms the above findings IMPRESSION: No emergent finding.  No stenosis or atheromatous changes. Electronically Signed   By: Marnee Spring M.D.   On: 06/04/2021 06:02       HISTORY OF PRESENT ILLNESS [name] Costa is a 40 y.o. left handed female with PMH  significant for anxiety who went to bed at 2230 on 06/03/21 and woke up at 0430 on 06/04/21 and mouth felt weird and had trouble walking. Stumbled while walking to the bathroom and noted facial droop on the left. EMS called and she was brought in as a stroke code.   Elevated SBP to over 200s on presentation with normal glucose. She had CTH w/o contrast and was taken to the MRI Brain which demonstrated mild diffusion restriction in the R lateral lenticulostriate distribution with no T2/FLAIR correlate.   Her symptoms were noted to be waxing and waning with some improvement in her weakness, followed by worsening of her deficit.   mRS: 0 tPA: Yes, discussed 30% chance of improvement and about a 6% chance of ICH which could result in worsening deficit or death  with the patient. She opted for tPA given that she is left handed and symptoms are very disabling. Thrombectomy: No LVO.  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 Known risk factors at this time include ?PFO, uncontrolled HTN, PCOS but isn't on hormonal treatment.  2D Echo ejection fraction 65 to 70%.  No wall motion abnormalities.  Negative agitated saline contrast bubble study. LDL 105   HgbA1c 5.4 UDS positive for marijuana TCD bubble study negative for PFO TEE positive bubble study SCDs for VTE prophylaxis No antithrombotic prior to admission; start DAPT for 3 weeks and then aspirin 81mg  alone Therapy recommendations: CIR   Elevated cardiolipin Ab IgM IgM = 108 denies any history of recurrent miscarriages or DVT/PE Need to repeat in 3 months.  ? PFO TCD bubble study negative for PFO TEE positive bubble study Follow-up with Dr. at [facility] in 4 weeks  Hyperlipidemia Home meds none LDL 105, goal less than 70 On Lipitor 40 Continue statin on discharge  Hypertension off cleviprex Stable now Long-term BP goal  normotensive  DISCHARGE EXAM Blood pressure (!) 147/98, pulse 66, temperature 97.6 F (36.4 C), temperature source Oral, resp. rate (!) 21, height 5\' 7"  (1.702 m), weight 101.8 kg, SpO2 98 %, unknown if currently breastfeeding.  General: Pleasant middle-aged African-American lady.  Not in distress.   Mental Status: Alert, oriented, thought content appropriate.  Speech mildly dysarthric without evidence of aphasia.  Able to follow 3 step commands without difficulty. Cranial Nerves: II:  Visual fields grossly normal, PERRL III,IV, VI: ptosis not present, extra-ocular motions intact bilaterally V,VII: Moderate left lower facial weakness.   , light touch sensation intact bilaterally VIII: hearing intact bilaterally XII: tongue extension deviated to the left, without atrophy or fasciculations   Motor: Right :  Upper extremity   5/5              Left:     Upper extremity   3/5             Lower extremity   5/5                          Lower extremity   3/5 hip, 2/5 ankle.  Diminished fine finger movements on the left.  Mild left grip weakness.   Tone and bulk:normal tone throughout; no atrophy noted Sensory: Light touch intact throughout, bilaterally Gait: Deferred  Discharge Diet      Diet   Diet regular Room service appropriate? Yes; Fluid consistency: Thin   liquids  DISCHARGE PLAN Disposition:  Transfer to [facility] for ongoing PT, OT and ST aspirin 81 mg daily and clopidogrel 75 mg daily for secondary stroke prevention for 3 weeks  then Aspirin 81 mg alone. Recommend ongoing stroke risk factor control by Primary Care Physician at time of discharge from inpatient rehabilitation. Follow-up PCP Patient, No Pcp Per (Inactive) in 2 weeks following discharge from rehab. Follow-up in [facility] with Dr. in 4 weeks following discharge from rehab, office to schedule an appointment.   35 minutes were spent preparing  discharge.  [facility], MSN, NP-C [facility] [phone]  ATTENDING NOTE: I reviewed above note and agree with the assessment and plan. Pt was seen and examined.   40 year old female with history of hypertension, anxiety admitted for left facial droop,  left-sided weakness.  CT no acute abnormality.  CTA head and neck negative.  MRI showed right BG infarct.  Status post tPA.  EF 65 to 70%.  LDL 105, A1c 5.4.  UDS positive for THC.  TCD bubble study negative for PFO however, TEE showed positive bubble study.  DVT negative.  Creatinine 0.70, WBC 11.1.  On examination patient awake alert, sitting in bed, fully orientated, no aphasia, follows simple commands.  No gaze palsy, visual field full.  Mild left nasolabial fold flattening, left upper extremity pronator drift, left lower extremity 4/5 proximal and 4+/5 distal.  Left finger-nose slow but intact.  Sensation symmetrical.  Etiology for patient stroke not quite clear, could be small vessel disease given location, or embolic pattern given size and questionable PFO as well as elevated cardiolipin IgM antibody.  Continue DAPT for 3 weeks and then aspirin alone.  Continue Lipitor 40.  Follow-up with Dr. Pearlean Brownie at [facility] in 4 weeks, need to repeat cardiolipin antibody IgM and to determine status of PFO.  PT/OT recommend CIR, patient stable and ready for CIR admission.  For detailed assessment and plan, please refer to above as I have made changes wherever appropriate.   Marvel Plan, MD PhD Stroke Neurology 06/07/2021 6:02 PM

## 2021-06-07 NOTE — Progress Notes (Signed)
Meredith Staggers, MD   Physician  Physical Medicine and Rehabilitation  PMR Pre-admission      Signed  Date of Service:  06/07/2021 10:58 AM       Related encounter: ED to Hosp-Admission (Discharged) from 06/04/2021 in [place] NEURO/TRAUMA/SURGICAL ICU       Signed          Show:Clear all '[x]' Written'[x]' Templated'[x]' Copied  Added by: '[x]' Cristina Gong, RN'[x]' Meredith Staggers, MD   '[]' Hover for details                                                                                                                                                                                                                                                                                                                                                                 PMR Admission Coordinator Pre-Admission Assessment   Patient: [name] Costa is an 40 y.o., y.o., female MRN: [id number] DOB: 03-02-1981 Height: '5\' 7"'  (170.2 cm) Weight: 101.8 kg   Insurance Information HMO:     PPO:      PCP:      IPA:      80/20:      OTHER: PRIMARY: [employer] [place]      Policy#: [id number]      Subscriber: pt CM Name: Larene Beach      Phone#: [phone]     Fax#: [phone] Pre-Cert#: [id number] approved until 7/27      Employer: Benefits:  Phone #: [phone]     Name: 7/7 Eff. Date: 12/01/2020     Deduct: $6550      Out of Pocket Max: $6550      Life Max: none CIR: 90%      SNF: 90% 6o days per year Outpatient: 90%     Co-Pay: combined  30 visits Home Health: 90%      Co-Pay: 10% DME: 100%     Co-Pay: none Providers: in network  SECONDARY: none        Financial Counselor:       Phone#:   [employer]" for patients in Inpatient Rehabilitation Facilities with attached "Privacy Act [place] Records" was provided and verbally reviewed with: N/A   Emergency  Contact Information Contact Information       Name Relation Home Work [place], [place] Daughter     [phone]           Current Medical History  Patient Admitting Diagnosis: CVA   History of Present Illness: 40 year old right-handed female right-handed female with unremarkable past medical history on no prescription medications.    Presented 06/04/2021 with acute onset of left-sided weakness with facial droop.  Elevated systolic blood pressure in the 200s.  Cranial CT scan negative.  CT angiogram of head and neck no emergent findings no stenosis.  Patient did receive TPA.  MRI showed mildly restricted diffusion without flair changes with a right lateral lenticulostriate distribution.  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  No hemorrhagic conversion.  Admission chemistries unremarkable except glucose 112, WBC 11,100, hemoglobin 10.5, urine drug screen positive marijuana.  Echocardiogram with ejection fraction of 65 to 70% no wall motion abnormalities.  TEE completed 06/06/2021 showing no thrombus or vegetation.  Patient had initially been placed on Cleviprex for blood pressure controlled and monitored.  Placed on aspirin 81 mg daily and Plavix 75 mg daily x3 months then aspirin alone.  Maintained on a mechanical soft diet.   Complete NIHSS TOTAL: 4   Patient's medical record from [facility]  has been reviewed by the rehabilitation admission coordinator and physician.   Past Medical History      Past Medical History:  Diagnosis Date   Bladder infection     Trichomonas        Family History   family history is not on file.   Prior Rehab/Hospitalizations Has the patient had prior rehab or hospitalizations prior to admission? Yes   Has the patient had major surgery during 100 days prior to admission? No               Current Medications   Current Facility-Administered Medications:    stroke: mapping our early stages of  recovery book, , Does not apply, Once, Williams, Jessica N, NP   0.9 %  sodium chloride infusion, , Intravenous, Continuous, Williams, Nita Sells, NP   acetaminophen (TYLENOL) tablet 650 mg, 650 mg, Oral, Q4H PRN **OR** acetaminophen (TYLENOL) 160 MG/5ML solution 650 mg, 650 mg, Per Tube, Q4H PRN **OR** acetaminophen (TYLENOL) suppository 650 mg, 650 mg, Rectal, Q4H PRN, Vonzella Nipple, NP   aspirin chewable tablet 81 mg, 81 mg, Oral, Daily, 81 mg at 06/07/21 1002 **OR** aspirin suppository 300 mg, 300 mg, Rectal, Daily, Laurey Morale N, NP, 300 mg at 06/05/21 0631   atorvastatin (LIPITOR) tablet 40 mg, 40 mg, Oral, Daily, Rosalin Hawking, MD, 40 mg at 06/07/21 1002   Chlorhexidine Gluconate Cloth 2 % PADS 6 each, 6 each, Topical, Daily, Vonzella Nipple, NP, 6 each at 06/05/21 1120   clopidogrel (PLAVIX) tablet 75 mg, 75 mg, Oral, Daily, Laurey Morale N, NP, 75 mg at 06/07/21 1002   ondansetron (ZOFRAN) injection 4 mg, 4 mg, Intravenous, Q8H PRN, Vonzella Nipple, NP,  4 mg at 06/04/21 0956   pantoprazole (PROTONIX) EC tablet 40 mg, 40 mg, Oral, QHS, Sethi, Pramod S, MD   senna-docusate (Senokot-S) tablet 1 tablet, 1 tablet, Oral, QHS PRN, Vonzella Nipple, NP   sodium chloride flush (NS) 0.9 % injection 3 mL, 3 mL, Intravenous, Once, Jimmye Norman Nita Sells, NP   Patients Current Diet:  Diet Order                  Diet regular Room service appropriate? Yes; Fluid consistency: Thin  Diet effective now                         Precautions / Restrictions Precautions Precautions: Fall Restrictions Weight Bearing Restrictions: No    Has the patient had 2 or more falls or a fall with injury in the past year? No   Prior Activity Level Community (5-7x/wk): Independent, working and driving   Prior Functional Level Self Care: Did the patient need help bathing, dressing, using the toilet or eating? Independent   Indoor Mobility: Did the patient need assistance with walking  from room to room (with or without device)? Independent   Stairs: Did the patient need assistance with internal or external stairs (with or without device)? Independent   Functional Cognition: Did the patient need help planning regular tasks such as shopping or remembering to take medications? Independent   Home Assistive Devices / Equipment Home Assistive Devices/Equipment: None Home Equipment: None   Prior Device Use: Indicate devices/aids used by the patient prior to current illness, exacerbation or injury? None of the above   Current Functional Level Cognition   Arousal/Alertness: Awake/alert Overall Cognitive Status: Impaired/Different from baseline Orientation Level: Oriented X4 Safety/Judgement: Decreased awareness of deficits, Decreased awareness of safety General Comments: Pt with improved memory, following multi-step commands throughout session. Pt understanding her deficits and need for further therapy to get better and go home. Increased time to sequence multi-step tasks. Attention: Sustained Sustained Attention: Appears intact Memory: Impaired Memory Impairment: Retrieval deficit, Storage deficit (mostly retrieval) Awareness: Appears intact Problem Solving: Impaired Problem Solving Impairment: Verbal complex Safety/Judgment: Appears intact    Extremity Assessment (includes Sensation/Coordination)   Upper Extremity Assessment: RUE deficits/detail, LUE deficits/detail RUE Deficits / Details: AROM, WFLs; fair grip LUE Deficits / Details: AROM, FF to 80* por grip strength and incoordination LUE Coordination: decreased fine motor, decreased gross motor  Lower Extremity Assessment: RLE deficits/detail, LLE deficits/detail RLE Deficits / Details: WFL LLE Deficits / Details: AROM noted today LLE Sensation: decreased light touch LLE Coordination: decreased fine motor, decreased gross motor     ADLs   Overall ADL's : Needs assistance/impaired Eating/Feeding: Set up,  Sitting Grooming: Min guard, Sitting, Minimal assistance, Standing Grooming Details (indicate cue type and reason): Assist in standing at sink for LLE knee block Upper Body Bathing: Minimal assistance, Sitting Lower Body Bathing: Moderate assistance, Sitting/lateral leans, Sit to/from stand, Cueing for safety, Cueing for sequencing Upper Body Dressing : Minimal assistance, Sitting, Cueing for safety, Cueing for sequencing Lower Body Dressing: Moderate assistance, Sitting/lateral leans, Sit to/from stand Lower Body Dressing Details (indicate cue type and reason): figure 4 with RLE and assist for LLE to start sock lying in bed with HOB elevated Toilet Transfer: Moderate assistance, +2 for physical assistance, +2 for safety/equipment, Cueing for safety, Cueing for sequencing, Ambulation, Regular Toilet, Grab bars Toilet Transfer Details (indicate cue type and reason): physical assist for LLE to move and assist with weightshifting. ModA +2 Toileting-  Clothing Manipulation and Hygiene: Moderate assistance, +2 for physical assistance, +2 for safety/equipment, Cueing for safety, Cueing for sequencing, Sitting/lateral lean, Sit to/from stand Functional mobility during ADLs: Moderate assistance, +2 for physical assistance, +2 for safety/equipment, Cueing for safety, Cueing for sequencing General ADL Comments: Pt limited by decreased strength, decreased activity tolerance and decreased cognition. Pt requires modA+2 for bed mobility and transfers. Pt requires multimodal cues to accomplish a task.     Mobility   Overal bed mobility: Needs Assistance Bed Mobility: Supine to Sit, Sit to Supine Rolling: Mod assist Sidelying to sit: Max assist Supine to sit: Min guard, HOB elevated Sit to supine: Min guard General bed mobility comments: Pt managing L leg well without physical assistance to exit/enter from L side of bed, min guard and extra time.     Transfers   Overall transfer level: Needs  assistance Equipment used: Rolling walker (2 wheeled) Transfers: Sit to/from Stand Sit to Stand: Min assist General transfer comment: Cues for hand placement on bed, L knee block and minA to power up to stand and steady.     Ambulation / Gait / Stairs / Wheelchair Mobility   Ambulation/Gait Ambulation/Gait assistance: Mod assist, Min assist Gait Distance (Feet): 40 Feet Assistive device: Rolling walker (2 wheeled) Gait Pattern/deviations: Decreased step length - left, Decreased stance time - left, Decreased weight shift to left, Decreased dorsiflexion - left, Decreased stride length, Trunk flexed, Step-through pattern, Scissoring General Gait Details: Pt needing cues for placement and management of RW, good carryover. Pt with tactile cues initially at L quad to facilitate extension during stance, but provided faded feedback with success as pt did not need tactile cues to maintain extension with stance towards final half of gait bout. Pt with hyperextension of L knee though during stance. Poor L foot placement, displaying intermittent scissoring with LOB posteriorly and to L, needing min-modA to recover. Cued pt to aim L foot for anterior L wheel of RW to correct scissoring, success. Cued for heel strike. Gait velocity: reduced Gait velocity interpretation: <1.31 ft/sec, indicative of household ambulator     Posture / Balance Dynamic Sitting Balance Sitting balance - Comments: Able to maintain sitting balance EOB with supervision. Balance Overall balance assessment: Needs assistance Sitting-balance support: No upper extremity supported Sitting balance-Leahy Scale: Fair Sitting balance - Comments: Able to maintain sitting balance EOB with supervision. Standing balance support: Bilateral upper extremity supported, During functional activity Standing balance-Leahy Scale: Poor Standing balance comment: UE support and physical assistance for standing.     Special needs/care consideration       Previous Home Environment  Living Arrangements:  (40 year old, 40 year old moving to [place])  Lives With: Family Available Help at Discharge:  (friends and family) Type of Home: House Home Layout: One level Home Access: Level entry Bathroom Shower/Tub: Chiropodist: Standard Bathroom Accessibility: Yes How Accessible: Accessible via walker [place]: No Additional Comments: 1 adult and 1 adolescent child with Autism.   Discharge Living Setting Plans for Discharge Living Setting: Patient's home, Lives with (comment) (8 year ol, 40 year old moving to [facility]) Type of Home at Discharge: House Discharge Home Layout: One level Discharge Home Access: Level entry Discharge Bathroom Shower/Tub: Tub/shower unit Discharge Bathroom Toilet: Standard Discharge Bathroom Accessibility: Yes How Accessible: Accessible via walker Does the patient have any problems obtaining your medications?: No   Social/Family/Support Systems Patient Roles: Parent Contact Information: daughter, [place] Carol Anticipated Caregiver: family and friends Anticipated Ambulance person Information:  see above Ability/Limitations of Caregiver: daughter moving to [place] Caregiver Availability: 24/7 Discharge Plan Discussed with Primary Caregiver: Yes Is Caregiver In Agreement with Plan?: Yes Does Caregiver/Family have Issues with Lodging/Transportation while Pt is in Rehab?: Yes   Goals Patient/Family Goal for Rehab: supervision wiht PT, OT and SLP Expected length of stay: ELOS 10 to 14 days Pt/Family Agrees to Admission and willing to participate: Yes Program Orientation Provided & Reviewed with Pt/Caregiver Including Roles  & Responsibilities: Yes   Decrease burden of Care through IP rehab admission: n/a   Possible need for SNF placement upon discharge: not anticipated   Patient Condition: I have reviewed medical records from [facility] , spoken with CM, and patient. I met  with patient at the bedside for inpatient rehabilitation assessment.  Patient will benefit from ongoing PT, OT, and SLP, can actively participate in 3 hours of therapy a day 5 days of the week, and can make measurable gains during the admission.  Patient will also benefit from the coordinated team approach during an Inpatient Acute Rehabilitation admission.  The patient will receive intensive therapy as well as Rehabilitation physician, nursing, social worker, and care management interventions.  Due to bladder management, bowel management, safety, skin/wound care, disease management, medication administration, pain management, and patient education the patient requires 24 hour a day rehabilitation nursing.  The patient is currently mod assist overall with mobility and basic ADLs.  Discharge setting and therapy post discharge at home with home health is anticipated.  Patient has agreed to participate in the Acute Inpatient Rehabilitation Program and will admit today.   Preadmission Screen Completed By:  Cleatrice Burke, 06/07/2021 10:58 AM ______________________________________________________________________   Discussed status with Dr. Naaman Plummer  on  06/07/2021 at 1105 and received approval for admission today.   Admission Coordinator:  Cleatrice Burke, RN, time 9485 Date 06/07/2021    Assessment/Plan: Diagnosis: right basal ganglia infarct Does the need for close, 24 hr/day Medical supervision in concert with the patient's rehab needs make it unreasonable for this patient to be served in a less intensive setting? Yes Co-Morbidities requiring supervision/potential complications: HTN, dysphagia Due to bladder management, bowel management, safety, skin/wound care, disease management, medication administration, pain management, and patient education, does the patient require 24 hr/day rehab nursing? Yes Does the patient require coordinated care of a physician, rehab nurse, PT, OT, and SLP to address  physical and functional deficits in the context of the above medical diagnosis(es)? Yes Addressing deficits in the following areas: balance, endurance, locomotion, strength, transferring, bowel/bladder control, bathing, dressing, feeding, grooming, toileting, cognition, speech, swallowing, and psychosocial support Can the patient actively participate in an intensive therapy program of at least 3 hrs of therapy 5 days a week? Yes The potential for patient to make measurable gains while on inpatient rehab is excellent Anticipated functional outcomes upon discharge from inpatient rehab: supervision PT, supervision OT, supervision SLP Estimated rehab length of stay to reach the above functional goals is: 10-14 days Anticipated discharge destination: Home 10. Overall Rehab/Functional Prognosis: excellent     MD Signature: Meredith Staggers, MD, [facility] Physical Medicine & Rehabilitation 06/07/2021           Revision History                        Note Details  Author Meredith Staggers, MD File Time 06/07/2021 11:36 AM  Author Type Physician Status Signed  Last Editor Meredith Staggers, MD  Service Physical Medicine and [place] # [id number] Admit Date 06/07/2021

## 2021-06-07 NOTE — Discharge Summary (Deleted)
.  sb

## 2021-06-07 NOTE — H&P (Signed)
Physical Medicine and Rehabilitation Admission H&P    Chief Complaint  Patient presents with   Code Stroke : Left facial droop/Left side weakness    LSN [id number] 06/03/21  : HPI: [name] Costa is a 40 year old right-handed female with unremarkable past medical history on no prescription medications.  Per chart review patient lives with her children.  Independent prior to admission and working.  1 level home with level entry.  She does have good family support.  Presented 06/04/2021 with acute onset of left-sided weakness with facial droop.  Elevated systolic blood pressure in the 200s.  Cranial CT scan negative.  CT angiogram of head and neck no emergent findings no stenosis.  Patient did receive tPA.  MRI showed mildly restricted diffusion without flair changes with a right lateral lenticulostriate distribution.  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  No hemorrhagic conversion.  Admission chemistries unremarkable except glucose 112, WBC 11,100, hemoglobin 10.5, urine drug screen positive marijuana.  Echocardiogram with ejection fraction of 65 to 70% no wall motion abnormalities.  TEE completed 06/06/2021 showing no thrombus or vegetation.  Patient had initially been placed on Cleviprex for blood pressure controlled and monitored.  Placed on aspirin 81 mg daily and Plavix 75 mg daily x3 months then aspirin alone.  Maintained on a mechanical soft diet.  Therapy evaluations completed due to patient's left-sided weakness decreased functional mobility was admitted for a comprehensive rehab program.  Review of Systems  Constitutional:  Negative for chills and fever.  HENT:  Negative for hearing loss.   Eyes:  Negative for blurred vision and double vision.  Respiratory:  Negative for cough and shortness of breath.   Cardiovascular:  Negative for chest pain, palpitations and leg swelling.  Gastrointestinal:  Positive for constipation. Negative  for heartburn and nausea.  Genitourinary:  Negative for dysuria, flank pain and hematuria.  Musculoskeletal:  Positive for joint pain and myalgias.  Skin:  Negative for rash.  Neurological:  Positive for weakness.       Occasional headaches  All other systems reviewed and are negative. Past Medical History:  Diagnosis Date   Bladder infection    Trichomonas    Past Surgical History:  Procedure Laterality Date   BUBBLE STUDY  06/06/2021   Procedure: BUBBLE STUDY;  Surgeon: [name], MD;  Location: [facility] ENDOSCOPY;  Service: Cardiovascular;;   CHOLECYSTECTOMY     CHOLECYSTECTOMY     TEE WITHOUT CARDIOVERSION N/A 06/06/2021   Procedure: TRANSESOPHAGEAL ECHOCARDIOGRAM (TEE);  Surgeon: [name], MD;  Location: [facility] ENDOSCOPY;  Service: Cardiovascular;  Laterality: N/A;   TUBAL LIGATION Bilateral 04/13/2013   Procedure: POST PARTUM TUBAL LIGATION;  Surgeon: Bing Plume, MD;  Location: [facility] ORS;  Service: Gynecology;  Laterality: Bilateral;   History reviewed. No pertinent family history. Social History:  reports that she has never smoked. She has never used smokeless tobacco. She reports that she does not drink alcohol and does not use drugs. Allergies:  Allergies  Allergen Reactions   Latex Rash   No medications prior to admission.    Drug Regimen Review Drug regimen was reviewed and remains appropriate with no significant issues identified  Home: Home Living Family/patient expects to be discharged to:: Private residence Living Arrangements: Children Available Help at Discharge: Family, Available 24 hours/day, Available PRN/intermittently Type of Home: House Home Access: Level entry Home Layout: One level Bathroom Shower/Tub: Engineer, manufacturing systems: Standard Home Equipment:  None Additional Comments: 1 adult and 1 adolescent child with Autism.   Functional History: Prior Function Level of Independence: Independent Comments: drove, worked  outside the home.  Functional Status:  Mobility: Bed Mobility Overal bed mobility: Needs Assistance Bed Mobility: Supine to Sit, Sit to Supine Rolling: Mod assist Sidelying to sit: Max assist Supine to sit: Min guard, HOB elevated Sit to supine: Min guard General bed mobility comments: Pt managing L leg well without physical assistance to exit/enter from L side of bed, min guard and extra time. Transfers Overall transfer level: Needs assistance Equipment used: Rolling walker (2 wheeled) Transfers: Sit to/from Stand Sit to Stand: Min assist General transfer comment: Cues for hand placement on bed, L knee block and minA to power up to stand and steady. Ambulation/Gait Ambulation/Gait assistance: Mod assist, Min assist Gait Distance (Feet): 40 Feet Assistive device: Rolling walker (2 wheeled) Gait Pattern/deviations: Decreased step length - left, Decreased stance time - left, Decreased weight shift to left, Decreased dorsiflexion - left, Decreased stride length, Trunk flexed, Step-through pattern, Scissoring General Gait Details: Pt needing cues for placement and management of RW, good carryover. Pt with tactile cues initially at L quad to facilitate extension during stance, but provided faded feedback with success as pt did not need tactile cues to maintain extension with stance towards final half of gait bout. Pt with hyperextension of L knee though during stance. Poor L foot placement, displaying intermittent scissoring with LOB posteriorly and to L, needing min-modA to recover. Cued pt to aim L foot for anterior L wheel of RW to correct scissoring, success. Cued for heel strike. Gait velocity: reduced Gait velocity interpretation: <1.31 ft/sec, indicative of household ambulator    ADL: ADL Overall ADL's : Needs assistance/impaired Eating/Feeding: Set up, Sitting Grooming: Min guard, Sitting, Minimal assistance, Standing Grooming Details (indicate cue type and reason): Assist in  standing at sink for LLE knee block Upper Body Bathing: Minimal assistance, Sitting Lower Body Bathing: Moderate assistance, Sitting/lateral leans, Sit to/from stand, Cueing for safety, Cueing for sequencing Upper Body Dressing : Minimal assistance, Sitting, Cueing for safety, Cueing for sequencing Lower Body Dressing: Moderate assistance, Sitting/lateral leans, Sit to/from stand Lower Body Dressing Details (indicate cue type and reason): figure 4 with RLE and assist for LLE to start sock lying in bed with HOB elevated Toilet Transfer: Moderate assistance, +2 for physical assistance, +2 for safety/equipment, Cueing for safety, Cueing for sequencing, Ambulation, Regular Toilet, Grab bars Toilet Transfer Details (indicate cue type and reason): physical assist for LLE to move and assist with weightshifting. ModA +2 Toileting- Clothing Manipulation and Hygiene: Moderate assistance, +2 for physical assistance, +2 for safety/equipment, Cueing for safety, Cueing for sequencing, Sitting/lateral lean, Sit to/from stand Functional mobility during ADLs: Moderate assistance, +2 for physical assistance, +2 for safety/equipment, Cueing for safety, Cueing for sequencing General ADL Comments: Pt limited by decreased strength, decreased activity tolerance and decreased cognition. Pt requires modA+2 for bed mobility and transfers. Pt requires multimodal cues to accomplish a task.  Cognition: Cognition Overall Cognitive Status: Impaired/Different from baseline Arousal/Alertness: Awake/alert Orientation Level: Oriented X4 Attention: Sustained Sustained Attention: Appears intact Memory: Impaired Memory Impairment: Retrieval deficit, Storage deficit (mostly retrieval) Awareness: Appears intact Problem Solving: Impaired Problem Solving Impairment: Verbal complex Safety/Judgment: Appears intact Cognition Arousal/Alertness: Awake/alert Behavior During Therapy: WFL for tasks assessed/performed Overall Cognitive  Status: Impaired/Different from baseline Area of Impairment: Safety/judgement, Awareness, Problem solving Memory: Decreased short-term memory Safety/Judgement: Decreased awareness of deficits, Decreased awareness of safety Awareness: Emergent Problem  Solving: Difficulty sequencing, Requires verbal cues General Comments: Pt with improved memory, following multi-step commands throughout session. Pt understanding her deficits and need for further therapy to get better and go home. Increased time to sequence multi-step tasks.  Physical Exam: Blood pressure (!) 147/98, pulse 66, temperature 97.6 F (36.4 C), temperature source Oral, resp. rate (!) 21, height 5\' 7"  (1.702 m), weight 101.8 kg, SpO2 98 %, unknown if currently breastfeeding. Physical Exam Constitutional:      Appearance: Normal appearance.  HENT:     Head: Normocephalic and atraumatic.     Right Ear: External ear normal.     Left Ear: External ear normal.     Nose: Nose normal.  Eyes:     Extraocular Movements: Extraocular movements intact.     Pupils: Pupils are equal, round, and reactive to light.  Cardiovascular:     Rate and Rhythm: Normal rate and regular rhythm.     Heart sounds: No murmur heard.   No gallop.  Pulmonary:     Effort: Pulmonary effort is normal. No respiratory distress.     Breath sounds: No wheezing.  Abdominal:     General: There is no distension.     Palpations: Abdomen is soft.     Tenderness: There is no abdominal tenderness.  Musculoskeletal:        General: No swelling or tenderness.     Cervical back: Normal range of motion and neck supple.  Skin:    General: Skin is warm and dry.  Neurological:     Mental Status: She is alert.     Comments: Patient is alert in no acute distress.  Mildly dysarthric.  Oriented to person place and time.  Follows commands. Cognition appears intact. Mild left central 7 and tongue deviation. No sensory abnl. LUE 3+ to 4-/5 prox to distal with decreased FTN and +  PD. LLE 4/5 prox to distal. DTR's 1+.   Psychiatric:        Mood and Affect: Mood normal.        Behavior: Behavior normal.        Thought Content: Thought content normal.        Judgment: Judgment normal.    Results for orders placed or performed during the hospital encounter of 06/04/21 (from the past 48 hour(s))  CBC     Status: Abnormal   Collection Time: 06/07/21  4:48 AM  Result Value Ref Range   WBC 10.3 4.0 - 10.5 K/uL   RBC 4.71 3.87 - 5.11 MIL/uL   Hemoglobin 11.6 (L) 12.0 - 15.0 g/dL   HCT 16.136.8 09.636.0 - 04.546.0 %   MCV 78.1 (L) 80.0 - 100.0 fL   MCH 24.6 (L) 26.0 - 34.0 pg   MCHC 31.5 30.0 - 36.0 g/dL   RDW 40.917.7 (H) 81.111.5 - 91.415.5 %   Platelets 294 150 - 400 K/uL   nRBC 0.0 0.0 - 0.2 %    Comment: Performed at [facility], [address] [place], [place] [id number]  Basic metabolic panel     Status: None   Collection Time: 06/07/21  4:48 AM  Result Value Ref Range   Sodium 135 135 - 145 mmol/L   Potassium 3.7 3.5 - 5.1 mmol/L   Chloride 104 98 - 111 mmol/L   CO2 22 22 - 32 mmol/L   Glucose, Bld 91 70 - 99 mg/dL    Comment: Glucose reference range applies only to samples taken after fasting for at least  8 hours.   BUN 16 6 - 20 mg/dL   Creatinine, Ser 7.78 0.44 - 1.00 mg/dL   Calcium 9.2 8.9 - 24.2 mg/dL   GFR, Estimated >35 >36 mL/min    Comment: (NOTE) Calculated using the CKD-EPI Creatinine Equation (2021)    Anion gap 9 5 - 15    Comment: Performed at [facility], [address] [place], [place] [address]   VAS [place] TRANSCRANIAL DOPPLER W BUBBLES  Result Date: 06/05/2021  Transcranial Doppler with Bubble Patient Name:  KAURI GARSON  Date of Exam:   06/05/2021 Medical Rec #: [id number]         Accession #:    [id number] Date of Birth: 03-Jan-1981         Patient Gender: F Patient Age:   40Y Exam Location:  [facility] Procedure:      VAS [place] TRANSCRANIAL Demetrios Isaacs Referring Phys: [id number] PRAMOD S SETHI  --------------------------------------------------------------------------------  Indications: Stroke. Comparison Study: No prior studies. Performing Technologist: Jean Rosenthal RDMS,RVT  Examination Guidelines: A complete evaluation includes B-mode imaging, spectral Doppler, color Doppler, and power Doppler as needed of all accessible portions of each vessel. Bilateral testing is considered an integral part of a complete examination. Limited examinations for reoccurring indications may be performed as noted.  Summary: No HITS at rest or during Valsalva. Negative transcranial Doppler Bubble study with no evidence of right to left intracardiac communication.  A vascular evaluation was performed. The right middle cerebral artery was studied. An IV was inserted into the patient's left forearm. Verbal informed consent was obtained.  *See table(s) above for TCD measurements and observations.    Preliminary    ECHO TEE  Result Date: 06/06/2021    TRANSESOPHOGEAL ECHO REPORT   Patient Name:   NAKISHA CHAI Date of Exam: 06/06/2021 Medical Rec #:  [id number]        Height:       67.0 in Accession #:    [id number]       Weight:       224.4 lb Date of Birth:  [birth year]        BSA:          2.124 m Patient Age:    40 years         BP:           141/77 mmHg Patient Gender: F                HR:           127 bpm. Exam Location:  Inpatient Procedure: 2D Echo Indications:    stroke  History:        Patient has no prior history of Echocardiogram examinations.                 Risk Factors:Current Smoker and Hypertension.  Sonographer:    Delcie Roch Referring Phys: [id number] CALLIE E GOODRICH PROCEDURE: After discussion of the risks and benefits of a TEE, an informed consent was obtained from the patient. The transesophogeal probe was passed without difficulty through the esophogus of the patient. Imaged were obtained with the patient in a left lateral decubitus position. Local oropharyngeal anesthetic was provided with  Cetacaine. Sedation performed by different physician. The patient was monitored while under deep sedation. Anesthestetic sedation was provided intravenously by Anesthesiology: 400mg  of Propofol, 80mg  of Lidocaine. The patient developed no complications during the procedure. IMPRESSIONS  1. Left ventricular ejection fraction, by estimation, is  60 to 65%. The left ventricle has normal function.  2. Right ventricular systolic function is normal. The right ventricular size is normal.  3. No left atrial/left atrial appendage thrombus was detected.  4. The mitral valve is normal in structure. Trivial mitral valve regurgitation.  5. The aortic valve is tricuspid. Aortic valve regurgitation is not visualized. No aortic stenosis is present.  6. Agitated saline contrast bubble study was positive with shunting observed within 3-6 cardiac cycles suggestive of interatrial shunt. Suggests small patent foramen ovale. FINDINGS  Left Ventricle: Left ventricular ejection fraction, by estimation, is 60 to 65%. The left ventricle has normal function. The left ventricular internal cavity size was normal in size. Right Ventricle: The right ventricular size is normal. No increase in right ventricular wall thickness. Right ventricular systolic function is normal. Left Atrium: Left atrial size was normal in size. No left atrial/left atrial appendage thrombus was detected. Right Atrium: Right atrial size was normal in size. Pericardium: There is no evidence of pericardial effusion. Mitral Valve: The mitral valve is normal in structure. Trivial mitral valve regurgitation. Tricuspid Valve: The tricuspid valve is normal in structure. Tricuspid valve regurgitation is trivial. Aortic Valve: The aortic valve is tricuspid. Aortic valve regurgitation is not visualized. No aortic stenosis is present. Pulmonic Valve: The pulmonic valve was grossly normal. Pulmonic valve regurgitation is trivial. Aorta: The aortic root and ascending aorta are  structurally normal, with no evidence of dilitation. IAS/Shunts: No atrial level shunt detected by color flow Doppler. Agitated saline contrast was given intravenously to evaluate for intracardiac shunting. Agitated saline contrast bubble study was positive with shunting observed within 3-6 cardiac cycles suggestive of interatrial shunt. A small patent foramen ovale is detected. Epifanio Lesches MD Electronically signed by Epifanio Lesches MD Signature Date/Time: 06/06/2021/3:51:04 PM    Final        Medical Problem List and Plan: 1.   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  Antithrombotics: -DVT/anticoagulation: SCDs  -antiplatelet therapy: Aspirin 81 mg daily and Plavix 75 mg daily x3 months then aspirin alone 3. Pain Management: Tylenol as needed 4. Mood: Provide emotional support  -antipsychotic agents: N/A 5. Neuropsych: This patient is capable of making decisions on her own behalf. 6. Skin/Wound Care: Routine skin checks 7. Fluids/Electrolytes/Nutrition: Routine in and outs with follow-up chemistries on admit 8.  Permissive hypertension.  Cleviprex discontinued.  Monitor with increased mobility  -BP elevated this morning, rx as needed 9.  Hyperlipidemia.  Lipitor 10.  Urine drug screen positive marijuana.  Provide counseling    Charlton Amor, PA-C 06/07/2021

## 2021-06-07 NOTE — Progress Notes (Signed)
Transitions of Care Team following this patient for discharge planning; will continue to follow as patient progresses.    Rui Wordell LCSW

## 2021-06-07 NOTE — Progress Notes (Signed)
Inpatient Rehabilitation Medication Review by a Pharmacist  A complete drug regimen review was completed for this patient to identify any potential clinically significant medication issues.  Clinically significant medication issues were identified:  no  Check AMION for pharmacist assigned to patient if future medication questions/issues arise during this admission.  Pharmacist comments:   Time spent performing this drug regimen review (minutes):  5 minutes   [place] Brittannie Tawney 06/07/2021 9:36 PM

## 2021-06-07 NOTE — Progress Notes (Signed)
Inpatient Rehabilitation Admissions Coordinator   I have insurance approval and can admit her to [facility] today. I have alerted acute team and TOC and will make the arrangements to admit today.  Ottie Glazier, RN, MSN Rehab Admissions Coordinator [phone] 06/07/2021 10:50 AM

## 2021-06-07 NOTE — Progress Notes (Signed)
Physical Therapy Treatment Patient Details Name: [name] Costa MRN: [id number] DOB: 01/13/1981 Today's Date: 06/07/2021    History of Present Illness pt is a 40 y/o female admitted 7/5 with left sided weakness and L facial droop with trouble walking.  tPA was administered.  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  PMHx:  chole o/w not pertinent.    PT Comments    Focused session on improving L quads and hamstring eccentric control and muscle activation speed to reduce her risk for L knee buckling or hyperextension during gait. Pt displayed no appreciative knee buckling this date but continues to hyperextend her knee, especially during push off. She was able to ambulate up to ~115 ft with a RW and minA today. Will continue to follow acutely. Current recommendations remain appropriate.   Follow Up Recommendations  CIR;Supervision/Assistance - 24 hour     Equipment Recommendations  Rolling walker with 5" wheels;3in1 (PT)    Recommendations for Other Services       Precautions / Restrictions Precautions Precautions: Fall Restrictions Weight Bearing Restrictions: No    Mobility  Bed Mobility Overal bed mobility: Needs Assistance Bed Mobility: Supine to Sit;Sit to Supine     Supine to sit: HOB elevated;Supervision Sit to supine: Supervision   General bed mobility comments: Pt managing L leg well without physical assistance to exit/enter from L side of bed, supervision and extra time.    Transfers Overall transfer level: Needs assistance Equipment used: Rolling walker (2 wheeled) Transfers: Sit to/from Stand Sit to Stand: Min assist         General transfer comment: Cues for hand placement on bed, minA to power up to stand and steady. Provided intermittent tactile cues at L quad to control ascent and descent with subsequent reps, x11 total, cuing pt to place R foot anterior to L to encourage L lower extremity  use.  Ambulation/Gait Ambulation/Gait assistance: Min assist Gait Distance (Feet): 115 Feet Assistive device: Rolling walker (2 wheeled) Gait Pattern/deviations: Decreased step length - left;Decreased stance time - left;Decreased weight shift to left;Decreased dorsiflexion - left;Decreased stride length;Trunk flexed;Step-through pattern;Narrow base of support Gait velocity: reduced Gait velocity interpretation: <1.31 ft/sec, indicative of household ambulator General Gait Details: Pt needing cues to aim L foot for anterior L wheel of RW for improved placement as she tends to place it in narrow stance, success and good carryover. No overt LOB and no buckling of knee this date, minA to steady and block L knee from hyperextending during stance, particularly push off phase. Poor L ankle dorsiflexion during swing with forefoot IC and poor toe push off, cues to correct min success. Pt looking down at foot constinuously.   Stairs             Wheelchair Mobility    Modified Rankin (Stroke Patients Only) Modified Rankin (Stroke Patients Only) Pre-Morbid Rankin Score: No symptoms Modified Rankin: Moderately severe disability     Balance Overall balance assessment: Needs assistance Sitting-balance support: No upper extremity supported Sitting balance-Leahy Scale: Fair Sitting balance - Comments: Able to maintain sitting balance EOB with supervision.   Standing balance support: Bilateral upper extremity supported;During functional activity Standing balance-Leahy Scale: Poor Standing balance comment: UE support and physical assistance for standing.                            Cognition Arousal/Alertness: Awake/alert Behavior During Therapy: WFL for tasks assessed/performed Overall Cognitive Status: Impaired/Different from baseline  Area of Impairment: Safety/judgement;Awareness;Problem solving                         Safety/Judgement: Decreased awareness of  safety Awareness: Emergent Problem Solving: Difficulty sequencing;Requires verbal cues General Comments: Following multi-step commands throughout session with increased time to sequence steps.      Exercises General Exercises - Lower Extremity Toe Raises: Both;10 reps;Seated;Strengthening Other Exercises Other Exercises: Sit to stand 10x with tactile cues at L quad for concentric and eccentric control with R foot anteriorly placed to L to increase L leg usage Other Exercises: Eccentric hamstring control against manual resistance sitting EOB 10x Other Exercises: Rapid stopping of manual push into L knee flexion or extension, alternating spontaneously to encourage improved muscle activation speed, sitting EOB ~15x    General Comments        Pertinent Vitals/Pain Pain Assessment: Faces Faces Pain Scale: No hurt Pain Intervention(s): Monitored during session    Home Living                      Prior Function            PT Goals (current goals can now be found in the care plan section) Acute Rehab PT Goals Patient Stated Goal: to go to CIR then home PT Goal Formulation: With patient Time For Goal Achievement: 06/18/21 Potential to Achieve Goals: Good Progress towards PT goals: Progressing toward goals    Frequency    Min 4X/week      PT Plan Current plan remains appropriate    Co-evaluation              AM-PAC PT "6 Clicks" Mobility   Outcome Measure  Help needed turning from your back to your side while in a flat bed without using bedrails?: A Little Help needed moving from lying on your back to sitting on the side of a flat bed without using bedrails?: A Little Help needed moving to and from a bed to a chair (including a wheelchair)?: A Little Help needed standing up from a chair using your arms (e.g., wheelchair or bedside chair)?: A Little Help needed to walk in hospital room?: A Little Help needed climbing 3-5 steps with a railing? : Total 6  Click Score: 16    End of Session Equipment Utilized During Treatment: Gait belt Activity Tolerance: Patient tolerated treatment well Patient left: with call bell/phone within reach;in bed;with bed alarm set   PT Visit Diagnosis: Unsteadiness on feet (R26.81);Other abnormalities of gait and mobility (R26.89);Other symptoms and signs involving the nervous system (R29.898);Hemiplegia and hemiparesis;Muscle weakness (generalized) (M62.81);Difficulty in walking, not elsewhere classified (R26.2) Hemiplegia - Right/Left: Left Hemiplegia - caused by: Cerebral infarction;Other cerebrovascular disease     Time: 0762-2633 PT Time Calculation (min) (ACUTE ONLY): 40 min  Charges:  $Gait Training: 8-22 mins $Neuromuscular Re-education: 23-37 mins                     Raymond Gurney, PT, DPT Acute Rehabilitation Services  Pager: [phone] Office: [phone]    Jewel Baize 06/07/2021, 2:39 PM

## 2021-06-07 NOTE — Progress Notes (Signed)
INPATIENT REHABILITATION ADMISSION NOTE   Arrival Method: wheelchair      Mental Orientation: A&Ox4   Assessment: done by RN   Skin: intact   IV'S: no IV's   Pain: no pain   Tubes and Drains: none   Safety Measures: fall risk, bed alarm on   Vital Signs: see flowsheets   Height and Weight: see flowsheets   Rehab Orientation: Done by RN   Family: at the bedside    Notes:

## 2021-06-07 NOTE — H&P (Signed)
Physical Medicine and Rehabilitation Admission H&P         Chief Complaint  Patient presents with   Code Stroke : Left facial droop/Left side weakness      LSN [id number] 06/03/21  : HPI: [name] Costa is a 40 year old right-handed female with unremarkable past medical history on no prescription medications.  Per chart review patient lives with her children.  Independent prior to admission and working.  1 level home with level entry.  She does have good family support.  Presented 06/04/2021 with acute onset of left-sided weakness with facial droop.  Elevated systolic blood pressure in the 200s.  Cranial CT scan negative.  CT angiogram of head and neck no emergent findings no stenosis.  Patient did receive tPA.  MRI showed mildly restricted diffusion without flair changes with a right lateral lenticulostriate distribution.  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  No hemorrhagic conversion.  Admission chemistries unremarkable except glucose 112, WBC 11,100, hemoglobin 10.5, urine drug screen positive marijuana.  Echocardiogram with ejection fraction of 65 to 70% no wall motion abnormalities.  TEE completed 06/06/2021 showing no thrombus or vegetation.  Patient had initially been placed on Cleviprex for blood pressure controlled and monitored.  Placed on aspirin 81 mg daily and Plavix 75 mg daily x3 months then aspirin alone.  Maintained on a mechanical soft diet.  Therapy evaluations completed due to patient's left-sided weakness decreased functional mobility was admitted for a comprehensive rehab program.   Review of Systems Constitutional:  Negative for chills and fever. HENT:  Negative for hearing loss.   Eyes:  Negative for blurred vision and double vision. Respiratory:  Negative for cough and shortness of breath.   Cardiovascular:  Negative for chest pain, palpitations and leg swelling. Gastrointestinal:  Positive for constipation.  Negative for heartburn and nausea. Genitourinary:  Negative for dysuria, flank pain and hematuria. Musculoskeletal:  Positive for joint pain and myalgias. Skin:  Negative for rash. Neurological:  Positive for weakness.       Occasional headaches  All other systems reviewed and are negative.     Past Medical History:  Diagnosis Date   Bladder infection     Trichomonas           Past Surgical History:  Procedure Laterality Date   BUBBLE STUDY   06/06/2021    Procedure: BUBBLE STUDY;  Surgeon: [name], MD;  Location: [facility] ENDOSCOPY;  Service: Cardiovascular;;   CHOLECYSTECTOMY       CHOLECYSTECTOMY       TEE WITHOUT CARDIOVERSION N/A 06/06/2021    Procedure: TRANSESOPHAGEAL ECHOCARDIOGRAM (TEE);  Surgeon: [name], MD;  Location: [facility] ENDOSCOPY;  Service: Cardiovascular;  Laterality: N/A;   TUBAL LIGATION Bilateral 04/13/2013    Procedure: POST PARTUM TUBAL LIGATION;  Surgeon: Bing Plume, MD;  Location: [facility] ORS;  Service: Gynecology;  Laterality: Bilateral;    History reviewed. No pertinent family history. Social History:  reports that she has never smoked. She has never used smokeless tobacco. She reports that she does not drink alcohol and does not use drugs. Allergies:      Allergies  Allergen Reactions   Latex Rash    No medications prior to admission.      Drug Regimen Review Drug regimen was reviewed and remains appropriate with no significant issues identified   Home: Home Living Family/patient expects to be discharged to:: Private residence Living Arrangements: Children  Available Help at Discharge: Family, Available 24 hours/day, Available PRN/intermittently Type of Home: House Home Access: Level entry Home Layout: One level Bathroom Shower/Tub: Engineer, manufacturing systemsTub/shower unit Bathroom Toilet: Standard Home Equipment: None Additional Comments: 1 adult and 1 adolescent child with Autism.   Functional History: Prior Function Level of Independence:  Independent Comments: drove, worked outside the home.   Functional Status:  Mobility: Bed Mobility Overal bed mobility: Needs Assistance Bed Mobility: Supine to Sit, Sit to Supine Rolling: Mod assist Sidelying to sit: Max assist Supine to sit: Min guard, HOB elevated Sit to supine: Min guard General bed mobility comments: Pt managing L leg well without physical assistance to exit/enter from L side of bed, min guard and extra time. Transfers Overall transfer level: Needs assistance Equipment used: Rolling walker (2 wheeled) Transfers: Sit to/from Stand Sit to Stand: Min assist General transfer comment: Cues for hand placement on bed, L knee block and minA to power up to stand and steady. Ambulation/Gait Ambulation/Gait assistance: Mod assist, Min assist Gait Distance (Feet): 40 Feet Assistive device: Rolling walker (2 wheeled) Gait Pattern/deviations: Decreased step length - left, Decreased stance time - left, Decreased weight shift to left, Decreased dorsiflexion - left, Decreased stride length, Trunk flexed, Step-through pattern, Scissoring General Gait Details: Pt needing cues for placement and management of RW, good carryover. Pt with tactile cues initially at L quad to facilitate extension during stance, but provided faded feedback with success as pt did not need tactile cues to maintain extension with stance towards final half of gait bout. Pt with hyperextension of L knee though during stance. Poor L foot placement, displaying intermittent scissoring with LOB posteriorly and to L, needing min-modA to recover. Cued pt to aim L foot for anterior L wheel of RW to correct scissoring, success. Cued for heel strike. Gait velocity: reduced Gait velocity interpretation: <1.31 ft/sec, indicative of household ambulator   ADL: ADL Overall ADL's : Needs assistance/impaired Eating/Feeding: Set up, Sitting Grooming: Min guard, Sitting, Minimal assistance, Standing Grooming Details (indicate  cue type and reason): Assist in standing at sink for LLE knee block Upper Body Bathing: Minimal assistance, Sitting Lower Body Bathing: Moderate assistance, Sitting/lateral leans, Sit to/from stand, Cueing for safety, Cueing for sequencing Upper Body Dressing : Minimal assistance, Sitting, Cueing for safety, Cueing for sequencing Lower Body Dressing: Moderate assistance, Sitting/lateral leans, Sit to/from stand Lower Body Dressing Details (indicate cue type and reason): figure 4 with RLE and assist for LLE to start sock lying in bed with HOB elevated Toilet Transfer: Moderate assistance, +2 for physical assistance, +2 for safety/equipment, Cueing for safety, Cueing for sequencing, Ambulation, Regular Toilet, Grab bars Toilet Transfer Details (indicate cue type and reason): physical assist for LLE to move and assist with weightshifting. ModA +2 Toileting- Clothing Manipulation and Hygiene: Moderate assistance, +2 for physical assistance, +2 for safety/equipment, Cueing for safety, Cueing for sequencing, Sitting/lateral lean, Sit to/from stand Functional mobility during ADLs: Moderate assistance, +2 for physical assistance, +2 for safety/equipment, Cueing for safety, Cueing for sequencing General ADL Comments: Pt limited by decreased strength, decreased activity tolerance and decreased cognition. Pt requires modA+2 for bed mobility and transfers. Pt requires multimodal cues to accomplish a task.   Cognition: Cognition Overall Cognitive Status: Impaired/Different from baseline Arousal/Alertness: Awake/alert Orientation Level: Oriented X4 Attention: Sustained Sustained Attention: Appears intact Memory: Impaired Memory Impairment: Retrieval deficit, Storage deficit (mostly retrieval) Awareness: Appears intact Problem Solving: Impaired Problem Solving Impairment: Verbal complex Safety/Judgment: Appears intact Cognition Arousal/Alertness: Awake/alert Behavior During Therapy: WFL for  tasks  assessed/performed Overall Cognitive Status: Impaired/Different from baseline Area of Impairment: Safety/judgement, Awareness, Problem solving Memory: Decreased short-term memory Safety/Judgement: Decreased awareness of deficits, Decreased awareness of safety Awareness: Emergent Problem Solving: Difficulty sequencing, Requires verbal cues General Comments: Pt with improved memory, following multi-step commands throughout session. Pt understanding her deficits and need for further therapy to get better and go home. Increased time to sequence multi-step tasks.   Physical Exam: Blood pressure (!) 147/98, pulse 66, temperature 97.6 F (36.4 C), temperature source Oral, resp. rate (!) 21, height  (1.702 m), weight 101.8 kg, SpO2 98 %, unknown if currently breastfeeding. Physical Exam Constitutional:      Appearance: Normal appearance. HENT:    Head: Normocephalic and atraumatic.    Right Ear: External ear normal.    Left Ear: External ear normal.    Nose: Nose normal. Eyes:    Extraocular Movements: Extraocular movements intact.    Pupils: Pupils are equal, round, and reactive to light. Cardiovascular:    Rate and Rhythm: Normal rate and regular rhythm.    Heart sounds: No murmur heard.   No gallop. Pulmonary:    Effort: Pulmonary effort is normal. No respiratory distress.    Breath sounds: No wheezing. Abdominal:    General: There is no distension.    Palpations: Abdomen is soft.    Tenderness: There is no abdominal tenderness. Musculoskeletal:        General: No swelling or tenderness.    Cervical back: Normal range of motion and neck supple. Skin:    General: Skin is warm and dry. Neurological:    Mental Status: She is alert.    Comments: Patient is alert in no acute distress.  Mildly dysarthric.  Oriented to person place and time.  Follows commands. Cognition appears intact. Mild left central 7 and tongue deviation. No sensory abnl. LUE 3+ to 4-/5 prox to distal with  decreased FTN and + PD. LLE 4/5 prox to distal. DTR's 1+.   Psychiatric:        Mood and Affect: Mood normal.        Behavior: Behavior normal.        Thought Content: Thought content normal.        Judgment: Judgment normal.     Lab Results Last 48 Hours        Results for orders placed or performed during the hospital encounter of 06/04/21 (from the past 48 hour(s))  CBC     Status: Abnormal    Collection Time: 06/07/21  4:48 AM  Result Value Ref Range    WBC 10.3 4.0 - 10.5 K/uL    RBC 4.71 3.87 - 5.11 MIL/uL    Hemoglobin 11.6 (L) 12.0 - 15.0 g/dL    HCT 16.1 09.6 - 04.5 %    MCV 78.1 (L) 80.0 - 100.0 fL    MCH 24.6 (L) 26.0 - 34.0 pg    MCHC 31.5 30.0 - 36.0 g/dL    RDW 40.9 (H) 81.1 - 15.5 %    Platelets 294 150 - 400 K/uL    nRBC 0.0 0.0 - 0.2 %      Comment: Performed at [facility], [address] [place], [place] [address]  Basic metabolic panel     Status: None    Collection Time: 06/07/21  4:48 AM  Result Value Ref Range    Sodium 135 135 - 145 mmol/L    Potassium 3.7 3.5 - 5.1 mmol/L    Chloride  104 98 - 111 mmol/L    CO2 22 22 - 32 mmol/L    Glucose, Bld 91 70 - 99 mg/dL      Comment: Glucose reference range applies only to samples taken after fasting for at least 8 hours.    BUN 16 6 - 20 mg/dL    Creatinine, Ser 5.39 0.44 - 1.00 mg/dL    Calcium 9.2 8.9 - 76.7 mg/dL    GFR, Estimated >34 >19 mL/min      Comment: (NOTE) Calculated using the CKD-EPI Creatinine Equation (2021)      Anion gap 9 5 - 15      Comment: Performed at [facility], [address] [place], [place] [address]       Imaging Results (Last 48 hours)  VAS [place] TRANSCRANIAL DOPPLER W BUBBLES   Result Date: 06/05/2021  Transcranial Doppler with Bubble Patient Name:  GRACIELA PLATO  Date of Exam:   06/05/2021 Medical Rec #: [id number]         Accession #:    [id number] Date of Birth: 12-Jan-1981         Patient Gender: F Patient Age:   40Y Exam Location:  [facility]  Procedure:      VAS [place] TRANSCRANIAL Demetrios Isaacs Referring Phys: [id number] PRAMOD S SETHI --------------------------------------------------------------------------------  Indications: Stroke. Comparison Study: No prior studies. Performing Technologist: Jean Rosenthal RDMS,RVT  Examination Guidelines: A complete evaluation includes B-mode imaging, spectral Doppler, color Doppler, and power Doppler as needed of all accessible portions of each vessel. Bilateral testing is considered an integral part of a complete examination. Limited examinations for reoccurring indications may be performed as noted.  Summary: No HITS at rest or during Valsalva. Negative transcranial Doppler Bubble study with no evidence of right to left intracardiac communication.  A vascular evaluation was performed. The right middle cerebral artery was studied. An IV was inserted into the patient's left forearm. Verbal informed consent was obtained.  *See table(s) above for TCD measurements and observations.    Preliminary     ECHO TEE   Result Date: 06/06/2021    TRANSESOPHOGEAL ECHO REPORT   Patient Name:   JACYLN CARMER Date of Exam: 06/06/2021 Medical Rec #:  [id number]        Height:       67.0 in Accession #:    [id number]       Weight:       224.4 lb Date of Birth:  1981-01-11        BSA:          2.124 m Patient Age:    40 years         BP:           141/77 mmHg Patient Gender: F                HR:           127 bpm. Exam Location:  Inpatient Procedure: 2D Echo Indications:    stroke  History:        Patient has no prior history of Echocardiogram examinations.                 Risk Factors:Current Smoker and Hypertension.  Sonographer:    Delcie Roch Referring Phys: [id number] CALLIE E GOODRICH PROCEDURE: After discussion of the risks and benefits of a TEE, an informed consent was obtained from the patient. The transesophogeal probe was passed without difficulty through the esophogus of  the patient. Imaged were obtained with the patient  in a left lateral decubitus position. Local oropharyngeal anesthetic was provided with Cetacaine. Sedation performed by different physician. The patient was monitored while under deep sedation. Anesthestetic sedation was provided intravenously by Anesthesiology: 400mg  of Propofol, 80mg  of Lidocaine. The patient developed no complications during the procedure. IMPRESSIONS  1. Left ventricular ejection fraction, by estimation, is 60 to 65%. The left ventricle has normal function.  2. Right ventricular systolic function is normal. The right ventricular size is normal.  3. No left atrial/left atrial appendage thrombus was detected.  4. The mitral valve is normal in structure. Trivial mitral valve regurgitation.  5. The aortic valve is tricuspid. Aortic valve regurgitation is not visualized. No aortic stenosis is present.  6. Agitated saline contrast bubble study was positive with shunting observed within 3-6 cardiac cycles suggestive of interatrial shunt. Suggests small patent foramen ovale. FINDINGS  Left Ventricle: Left ventricular ejection fraction, by estimation, is 60 to 65%. The left ventricle has normal function. The left ventricular internal cavity size was normal in size. Right Ventricle: The right ventricular size is normal. No increase in right ventricular wall thickness. Right ventricular systolic function is normal. Left Atrium: Left atrial size was normal in size. No left atrial/left atrial appendage thrombus was detected. Right Atrium: Right atrial size was normal in size. Pericardium: There is no evidence of pericardial effusion. Mitral Valve: The mitral valve is normal in structure. Trivial mitral valve regurgitation. Tricuspid Valve: The tricuspid valve is normal in structure. Tricuspid valve regurgitation is trivial. Aortic Valve: The aortic valve is tricuspid. Aortic valve regurgitation is not visualized. No aortic stenosis is present. Pulmonic Valve: The pulmonic valve was grossly normal. Pulmonic  valve regurgitation is trivial. Aorta: The aortic root and ascending aorta are structurally normal, with no evidence of dilitation. IAS/Shunts: No atrial level shunt detected by color flow Doppler. Agitated saline contrast was given intravenously to evaluate for intracardiac shunting. Agitated saline contrast bubble study was positive with shunting observed within 3-6 cardiac cycles suggestive of interatrial shunt. A small patent foramen ovale is detected. MD Electronically signed by MD Signature Date/Time: 06/06/2021/3:51:04 PM    Final              Medical Problem List and Plan: 1.   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  Antithrombotics: -DVT/anticoagulation: SCDs             -antiplatelet therapy: Aspirin 81 mg daily and Plavix 75 mg daily x3 months then aspirin alone 3. Pain Management: Tylenol as needed 4. Mood: Provide emotional support             -antipsychotic agents: N/A 5. Neuropsych: This patient is capable of making decisions on her own behalf. 6. Skin/Wound Care: Routine skin checks 7. Fluids/Electrolytes/Nutrition: Routine in and outs with follow-up chemistries on admit 8.  Permissive hypertension.  Cleviprex discontinued.  Monitor with increased mobility             -BP elevated this morning. Monitor for pattern and rx as needed 9.  Hyperlipidemia.  Lipitor 10.  Urine drug screen positive marijuana.  Provide counseling as appropriate       Epifanio Lesches, PA-C 06/07/2021  I have personally performed a face to face diagnostic evaluation of this patient and formulated the key components of the plan.  Additionally, I have personally reviewed laboratory data, imaging studies, as well as relevant notes and concur with the physician assistant's documentation above.  The patient's status has not changed from  the original H&P.  Any changes in documentation from the acute care chart have been noted above.  Ranelle Oyster, MD, [place]

## 2021-06-07 NOTE — PMR Pre-admission (Signed)
PMR Admission Coordinator Pre-Admission Assessment  Patient: [name] Costa is an 40 y.o., female MRN: [id number] DOB: [birth year] Height: '5\' 7"'  (170.2 cm) Weight: 101.8 kg  Insurance Information HMO:     PPO:      PCP:      IPA:      80/20:      OTHER:  PRIMARY: [employer] [place]      Policy#: [id number]      Subscriber: pt CM Name: Larene Beach      Phone#: [phone]     Fax#: [phone] Pre-Cert#: [id number] approved until 7/27      Employer:  Benefits:  Phone #: [phone]     Name: 7/7 Eff. Date: 12/01/2020     Deduct: $6550      Out of Pocket Max: $6550      Life Max: none CIR: 90%      SNF: 90% 6o days per year Outpatient: 90%     Co-Pay: combined 30 visits Home Health: 90%      Co-Pay: 10% DME: 100%     Co-Pay: none Providers: in network  SECONDARY: none       Financial Counselor:       Phone#:   [employer] Engineer, [employer]" for patients in Inpatient Rehabilitation Facilities with attached "Privacy Act [place] Records" was provided and verbally reviewed with: N/A  Emergency Contact Information Contact Information     Name Relation Home Work [employer], [place] Daughter   [phone]       Current Medical History  Patient Admitting Diagnosis: CVA  History of Present Illness: 40 year old right-handed female with unremarkable past medical history on no prescription medications.    Presented 06/04/2021 with acute onset of left-sided weakness with facial droop.  Elevated systolic blood pressure in the 200s.  Cranial CT scan negative.  CT angiogram of head and neck no emergent findings no stenosis.  Patient did receive TPA.  MRI showed mildly restricted diffusion without flair changes with a right lateral lenticulostriate distribution.  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  No hemorrhagic conversion.  Admission chemistries unremarkable except glucose 112, WBC 11,100, hemoglobin  10.5, urine drug screen positive marijuana.  Echocardiogram with ejection fraction of 65 to 70% no wall motion abnormalities.  TEE completed 06/06/2021 showing no thrombus or vegetation.  Patient had initially been placed on Cleviprex for blood pressure controlled and monitored.  Placed on aspirin 81 mg daily and Plavix 75 mg daily x3 months then aspirin alone.  Maintained on a mechanical soft diet.  Complete NIHSS TOTAL: 4  Patient's medical record from [facility]  has been reviewed by the rehabilitation admission coordinator and physician.  Past Medical History  Past Medical History:  Diagnosis Date   Bladder infection    Trichomonas     Family History   family history is not on file.  Prior Rehab/Hospitalizations Has the patient had prior rehab or hospitalizations prior to admission? Yes  Has the patient had major surgery during 100 days prior to admission? No   Current Medications  Current Facility-Administered Medications:     stroke: mapping our early stages of recovery book, , Does not apply, Once, Williams, Jessica N, NP   0.9 %  sodium chloride infusion, , Intravenous, Continuous, Williams, Nita Sells, NP   acetaminophen (TYLENOL) tablet 650 mg, 650 mg, Oral, Q4H PRN **OR** acetaminophen (TYLENOL) 160 MG/5ML solution 650 mg, 650 mg, Per Tube, Q4H  PRN **OR** acetaminophen (TYLENOL) suppository 650 mg, 650 mg, Rectal, Q4H PRN, Vonzella Nipple, NP   aspirin chewable tablet 81 mg, 81 mg, Oral, Daily, 81 mg at 06/07/21 1002 **OR** aspirin suppository 300 mg, 300 mg, Rectal, Daily, Laurey Morale N, NP, 300 mg at 06/05/21 0631   atorvastatin (LIPITOR) tablet 40 mg, 40 mg, Oral, Daily, Rosalin Hawking, MD, 40 mg at 06/07/21 1002   Chlorhexidine Gluconate Cloth 2 % PADS 6 each, 6 each, Topical, Daily, Vonzella Nipple, NP, 6 each at 06/05/21 1120   clopidogrel (PLAVIX) tablet 75 mg, 75 mg, Oral, Daily, Williams, Jessica N, NP, 75 mg at 06/07/21 1002   ondansetron (ZOFRAN)  injection 4 mg, 4 mg, Intravenous, Q8H PRN, Vonzella Nipple, NP, 4 mg at 06/04/21 0956   pantoprazole (PROTONIX) EC tablet 40 mg, 40 mg, Oral, QHS, Sethi, Pramod S, MD   senna-docusate (Senokot-S) tablet 1 tablet, 1 tablet, Oral, QHS PRN, Vonzella Nipple, NP   sodium chloride flush (NS) 0.9 % injection 3 mL, 3 mL, Intravenous, Once, Vonzella Nipple, NP  Patients Current Diet:  Diet Order             Diet regular Room service appropriate? Yes; Fluid consistency: Thin  Diet effective now                   Precautions / Restrictions Precautions Precautions: Fall Restrictions Weight Bearing Restrictions: No   Has the patient had 2 or more falls or a fall with injury in the past year? No  Prior Activity Level Community (5-7x/wk): Independent, working and driving  Prior Functional Level Self Care: Did the patient need help bathing, dressing, using the toilet or eating? Independent  Indoor Mobility: Did the patient need assistance with walking from room to room (with or without device)? Independent  Stairs: Did the patient need assistance with internal or external stairs (with or without device)? Independent  Functional Cognition: Did the patient need help planning regular tasks such as shopping or remembering to take medications? Independent  Home Assistive Devices / Equipment Home Assistive Devices/Equipment: None Home Equipment: None  Prior Device Use: Indicate devices/aids used by the patient prior to current illness, exacerbation or injury? None of the above  Current Functional Level Cognition  Arousal/Alertness: Awake/alert Overall Cognitive Status: Impaired/Different from baseline Orientation Level: Oriented X4 Safety/Judgement: Decreased awareness of deficits, Decreased awareness of safety General Comments: Pt with improved memory, following multi-step commands throughout session. Pt understanding her deficits and need for further therapy to get better  and go home. Increased time to sequence multi-step tasks. Attention: Sustained Sustained Attention: Appears intact Memory: Impaired Memory Impairment: Retrieval deficit, Storage deficit (mostly retrieval) Awareness: Appears intact Problem Solving: Impaired Problem Solving Impairment: Verbal complex Safety/Judgment: Appears intact    Extremity Assessment (includes Sensation/Coordination)  Upper Extremity Assessment: RUE deficits/detail, LUE deficits/detail RUE Deficits / Details: AROM, WFLs; fair grip LUE Deficits / Details: AROM, FF to 80* por grip strength and incoordination LUE Coordination: decreased fine motor, decreased gross motor  Lower Extremity Assessment: RLE deficits/detail, LLE deficits/detail RLE Deficits / Details: WFL LLE Deficits / Details: AROM noted today LLE Sensation: decreased light touch LLE Coordination: decreased fine motor, decreased gross motor    ADLs  Overall ADL's : Needs assistance/impaired Eating/Feeding: Set up, Sitting Grooming: Min guard, Sitting, Minimal assistance, Standing Grooming Details (indicate cue type and reason): Assist in standing at sink for LLE knee block Upper Body Bathing: Minimal assistance, Sitting Lower Body Bathing: Moderate assistance,  Sitting/lateral leans, Sit to/from stand, Cueing for safety, Cueing for sequencing Upper Body Dressing : Minimal assistance, Sitting, Cueing for safety, Cueing for sequencing Lower Body Dressing: Moderate assistance, Sitting/lateral leans, Sit to/from stand Lower Body Dressing Details (indicate cue type and reason): figure 4 with RLE and assist for LLE to start sock lying in bed with HOB elevated Toilet Transfer: Moderate assistance, +2 for physical assistance, +2 for safety/equipment, Cueing for safety, Cueing for sequencing, Ambulation, Regular Toilet, Grab bars Toilet Transfer Details (indicate cue type and reason): physical assist for LLE to move and assist with weightshifting. ModA  +2 Toileting- Clothing Manipulation and Hygiene: Moderate assistance, +2 for physical assistance, +2 for safety/equipment, Cueing for safety, Cueing for sequencing, Sitting/lateral lean, Sit to/from stand Functional mobility during ADLs: Moderate assistance, +2 for physical assistance, +2 for safety/equipment, Cueing for safety, Cueing for sequencing General ADL Comments: Pt limited by decreased strength, decreased activity tolerance and decreased cognition. Pt requires modA+2 for bed mobility and transfers. Pt requires multimodal cues to accomplish a task.    Mobility  Overal bed mobility: Needs Assistance Bed Mobility: Supine to Sit, Sit to Supine Rolling: Mod assist Sidelying to sit: Max assist Supine to sit: Min guard, HOB elevated Sit to supine: Min guard General bed mobility comments: Pt managing L leg well without physical assistance to exit/enter from L side of bed, min guard and extra time.    Transfers  Overall transfer level: Needs assistance Equipment used: Rolling walker (2 wheeled) Transfers: Sit to/from Stand Sit to Stand: Min assist General transfer comment: Cues for hand placement on bed, L knee block and minA to power up to stand and steady.    Ambulation / Gait / Stairs / Wheelchair Mobility  Ambulation/Gait Ambulation/Gait assistance: Mod assist, Min assist Gait Distance (Feet): 40 Feet Assistive device: Rolling walker (2 wheeled) Gait Pattern/deviations: Decreased step length - left, Decreased stance time - left, Decreased weight shift to left, Decreased dorsiflexion - left, Decreased stride length, Trunk flexed, Step-through pattern, Scissoring General Gait Details: Pt needing cues for placement and management of RW, good carryover. Pt with tactile cues initially at L quad to facilitate extension during stance, but provided faded feedback with success as pt did not need tactile cues to maintain extension with stance towards final half of gait bout. Pt with  hyperextension of L knee though during stance. Poor L foot placement, displaying intermittent scissoring with LOB posteriorly and to L, needing min-modA to recover. Cued pt to aim L foot for anterior L wheel of RW to correct scissoring, success. Cued for heel strike. Gait velocity: reduced Gait velocity interpretation: <1.31 ft/sec, indicative of household ambulator    Posture / Balance Dynamic Sitting Balance Sitting balance - Comments: Able to maintain sitting balance EOB with supervision. Balance Overall balance assessment: Needs assistance Sitting-balance support: No upper extremity supported Sitting balance-Leahy Scale: Fair Sitting balance - Comments: Able to maintain sitting balance EOB with supervision. Standing balance support: Bilateral upper extremity supported, During functional activity Standing balance-Leahy Scale: Poor Standing balance comment: UE support and physical assistance for standing.    Special needs/care consideration    Previous Home Environment  Living Arrangements:  (40 year old, 40 year old moving to [place])  Lives With: Family Available Help at Discharge:  (friends and family) Type of Home: House Home Layout: One level Home Access: Level entry Bathroom Shower/Tub: Chiropodist: Standard Bathroom Accessibility: Yes How Accessible: Accessible via walker [place]: No Additional Comments: 1 adult and  1 adolescent child with Autism.  Discharge Living Setting Plans for Discharge Living Setting: Patient's home, Lives with (comment) (8 year ol, 40 year old moving to [facility]) Type of Home at Discharge: House Discharge Home Layout: One level Discharge Home Access: Level entry Discharge Bathroom Shower/Tub: Tub/shower unit Discharge Bathroom Toilet: Standard Discharge Bathroom Accessibility: Yes How Accessible: Accessible via walker Does the patient have any problems obtaining your medications?: No  Social/Family/Support  Systems Patient Roles: Parent Contact Information: daughter, [place] Carol Anticipated Caregiver: family and friends Anticipated Ambulance person Information: see above Ability/Limitations of Caregiver: daughter moving to Museum/gallery conservator Caregiver Availability: 24/7 Discharge Plan Discussed with Primary Caregiver: Yes Is Caregiver In Agreement with Plan?: Yes Does Caregiver/Family have Issues with Lodging/Transportation while Pt is in Rehab?: Yes  Goals Patient/Family Goal for Rehab: supervision wiht PT, OT and SLP Expected length of stay: ELOS 10 to 14 days Pt/Family Agrees to Admission and willing to participate: Yes Program Orientation Provided & Reviewed with Pt/Caregiver Including Roles  & Responsibilities: Yes  Decrease burden of Care through IP rehab admission: n/a  Possible need for SNF placement upon discharge: not anticipated  Patient Condition: I have reviewed medical records from [facility] , spoken with CM, and patient. I met with patient at the bedside for inpatient rehabilitation assessment.  Patient will benefit from ongoing PT, OT, and SLP, can actively participate in 3 hours of therapy a day 5 days of the week, and can make measurable gains during the admission.  Patient will also benefit from the coordinated team approach during an Inpatient Acute Rehabilitation admission.  The patient will receive intensive therapy as well as Rehabilitation physician, nursing, social worker, and care management interventions.  Due to bladder management, bowel management, safety, skin/wound care, disease management, medication administration, pain management, and patient education the patient requires 24 hour a day rehabilitation nursing.  The patient is currently mod assist overall with mobility and basic ADLs.  Discharge setting and therapy post discharge at home with home health is anticipated.  Patient has agreed to participate in the Acute Inpatient Rehabilitation Program and will admit  today.  Preadmission Screen Completed By:  Cleatrice Burke, 06/07/2021 10:58 AM ______________________________________________________________________   Discussed status with Dr. Naaman Plummer  on  06/07/2021 at 1105 and received approval for admission today.  Admission Coordinator:  Cleatrice Burke, RN, time 6160 Date 06/07/2021   Assessment/Plan: Diagnosis: right basal ganglia infarct Does the need for close, 24 hr/day Medical supervision in concert with the patient's rehab needs make it unreasonable for this patient to be served in a less intensive setting? Yes Co-Morbidities requiring supervision/potential complications: HTN, dysphagia Due to bladder management, bowel management, safety, skin/wound care, disease management, medication administration, pain management, and patient education, does the patient require 24 hr/day rehab nursing? Yes Does the patient require coordinated care of a physician, rehab nurse, PT, OT, and SLP to address physical and functional deficits in the context of the above medical diagnosis(es)? Yes Addressing deficits in the following areas: balance, endurance, locomotion, strength, transferring, bowel/bladder control, bathing, dressing, feeding, grooming, toileting, cognition, speech, swallowing, and psychosocial support Can the patient actively participate in an intensive therapy program of at least 3 hrs of therapy 5 days a week? Yes The potential for patient to make measurable gains while on inpatient rehab is excellent Anticipated functional outcomes upon discharge from inpatient rehab: supervision PT, supervision OT, supervision SLP Estimated rehab length of stay to reach the above functional goals is: 10-14 days Anticipated discharge destination: Home  10. Overall Rehab/Functional Prognosis: excellent   MD Signature: Meredith Staggers, MD, [facility] 06/07/2021

## 2021-06-07 NOTE — Progress Notes (Signed)
  Speech Language Pathology Treatment: Dysphagia;Cognitive-Linquistic  Patient Details Name: [name] Costa MRN: [id number] DOB: 08-26-1981 Today's Date: 06/07/2021 Time: 4854-6270 SLP Time Calculation (min) (ACUTE ONLY): 22 min  Assessment / Plan / Recommendation Clinical Impression  [name] Costa is making great progress toward speech, swallow and cognitive goals. She is demonstrating anticipatory awareness of needs and executive functions to implement (on phone w/ HR rep at her work for income etc). Articulation is more precise and left droop present but less significant.   She is swallowing thin and solid textures with unremarkably. No pocketing in left sulci, no leakage using straw. Will upgrade texture to regular. Asked if family was able to bring her food and now that texture if regular that is fine with this therapist.  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    HPI HPI: 40 yr old woke up this am and noted gait disturbance, left sided arm/leg and facial weakness. MRI showed mildly restricted diffusion without FLAIR changes with a right lateral lenticulostriate distribution. PMH; UTI      SLP Plan  Continue with current plan of care       Recommendations  Diet recommendations: Regular;Thin liquid Liquids provided via: Cup;Straw Medication Administration: Whole meds with puree Supervision: Patient able to self feed Compensations: Slow rate;Small sips/bites;Lingual sweep for clearance of pocketing Postural Changes and/or Swallow Maneuvers: Seated upright 90 degrees                Oral Care Recommendations: Oral care BID Follow up Recommendations: Inpatient Rehab SLP Visit Diagnosis: Dysphagia, unspecified (R13.10);Cognitive communication deficit (J50.093) Plan: Continue with current plan of care                      Royce Macadamia 06/07/2021, 10:20 AM  Breck Coons Lonell Face.Ed  Nurse, children's [phone] Office [phone]

## 2021-06-08 MED ORDER — WHITE PETROLATUM EX OINT
TOPICAL_OINTMENT | CUTANEOUS | Status: AC
  Filled 2021-06-08: qty 28.35

## 2021-06-08 MED ORDER — B COMPLEX-C PO TABS
1.0000 | ORAL_TABLET | Freq: Every day | ORAL | Status: DC
  Administered 2021-06-08 – 2021-06-13 (×6): 1 via ORAL
  Filled 2021-06-08 (×6): qty 1

## 2021-06-08 MED ORDER — VITAMIN D (ERGOCALCIFEROL) 1.25 MG (50000 UNIT) PO CAPS
50000.0000 [IU] | ORAL_CAPSULE | ORAL | Status: DC
  Administered 2021-06-08: 50000 [IU] via ORAL
  Filled 2021-06-08: qty 1

## 2021-06-08 NOTE — Evaluation (Signed)
Occupational Therapy Assessment and Plan  Patient Details  Name: [name] Costa MRN: [id number] Date of Birth: Tarnisha 13, 1982  OT Diagnosis: hemiplegia affecting dominant side and muscle weakness (generalized) Rehab Potential: Rehab Potential (ACUTE ONLY): Excellent ELOS: 7-10 days   Today's Date: 06/08/2021 OT Individual Time: 2297-9892 OT Individual Time Calculation (min): 61 min     Hospital Problem: Principal Problem:   Infarction of right basal ganglia ([place])   Past Medical History:  Past Medical History:  Diagnosis Date   Bladder infection    Trichomonas    Past Surgical History:  Past Surgical History:  Procedure Laterality Date   BUBBLE STUDY  06/06/2021   Procedure: BUBBLE STUDY;  Surgeon: Donato Heinz, MD;  Location: [facility];  Service: Cardiovascular;;   CHOLECYSTECTOMY     CHOLECYSTECTOMY     TEE WITHOUT CARDIOVERSION N/A 06/06/2021   Procedure: TRANSESOPHAGEAL ECHOCARDIOGRAM (TEE);  Surgeon: Donato Heinz, MD;  Location: [facility];  Service: Cardiovascular;  Laterality: N/A;   TUBAL LIGATION Bilateral 04/13/2013   Procedure: POST PARTUM TUBAL LIGATION;  Surgeon: Melina Schools, MD;  Location: [facility];  Service: Gynecology;  Laterality: Bilateral;    Assessment & Plan Clinical Impression: Patient is a 40 y/o female admitted 7/5 with left sided weakness and L facial droop with trouble walking. tPA was administered.  MRI showed infarct in the R lateral lenticulosriate distribution.  Patient transferred to CIR on 06/07/2021 .    Patient currently requires min with basic self-care skills secondary to muscle weakness, decreased coordination, and decreased standing balance.  Prior to hospitalization, patient could complete ADLs, IADLs, and driving with independent .  Patient will benefit from skilled intervention to increase independence with basic self-care skills and increase level of independence with iADL prior to discharge home with care partner.   Anticipate patient will require intermittent supervision and follow up home health.  OT - End of Session Activity Tolerance: Tolerates 30+ min activity with multiple rests Endurance Deficit: Yes Endurance Deficit Description: requires multiple rests during session OT Assessment Rehab Potential (ACUTE ONLY): Excellent OT Barriers to Discharge: Decreased caregiver support;Home environment access/layout OT Barriers to Discharge Comments: limited caregiver support OT Patient demonstrates impairments in the following area(s): Balance;Endurance;Motor OT Basic ADL's Functional Problem(s): Bathing;Grooming;Dressing;Toileting OT Advanced ADL's Functional Problem(s): Simple Meal Preparation;Light Housekeeping OT Transfers Functional Problem(s): Tub/Shower;Toilet OT Additional Impairment(s): Fuctional Use of Upper Extremity OT Plan OT Intensity: Minimum of 1-2 x/day, 45 to 90 minutes OT Frequency: 5 out of 7 days OT Duration/Estimated Length of Stay: 7-10 days OT Treatment/Interventions: Balance/vestibular training;Neuromuscular re-education;Self Care/advanced ADL retraining;Therapeutic Exercise;UE/LE Strength taining/ROM;Skin care/wound managment;Pain management;DME/adaptive equipment instruction;Community reintegration;Patient/family education;UE/LE Coordination activities;Therapeutic Activities;Functional mobility training;Discharge planning OT Self Feeding Anticipated Outcome(s): independent OT Basic Self-Care Anticipated Outcome(s): mod I OT Toileting Anticipated Outcome(s): mod I OT Bathroom Transfers Anticipated Outcome(s): supervision OT Recommendation Recommendations for Other Services: Therapeutic Recreation consult Therapeutic Recreation Interventions: Kitchen group Patient destination: Home Follow Up Recommendations: Home health OT Equipment Recommended: 3 in 1 bedside comode;Tub/shower bench;To be determined   OT Evaluation Precautions/Restrictions  Precautions Precautions:  Fall Restrictions Weight Bearing Restrictions: No General Chart Reviewed: Yes Family/Caregiver Present: No Vital Signs Therapy Vitals Temp: 98.5 F (36.9 C) Temp Source: Oral Pulse Rate: 75 Resp: 14 BP: (!) 162/95 Patient Position (if appropriate): Lying Oxygen Therapy SpO2: 100 % O2 Device: Room Air Pain Pain Assessment Pain Scale: 0-10 Pain Score: 0-No pain Home Living/Prior Functioning Home Living Available Help at Discharge: Family Type of Home: Apartment (duplex) Home Access:  Stairs to enter [employer] of Steps: 1 Home Layout: One level Bathroom Shower/Tub: Government social research officer Accessibility: Yes Additional Comments: has 79yo son, 18yo dtr lives nearby as well as 36 yo godson that occasionally stay with her  Lives With: Son, Family IADL History Homemaking Responsibilities: Yes Meal Prep Responsibility: Primary Laundry Responsibility: Primary Cleaning Responsibility: Primary Child Care Responsibility: Primary Current License: Yes Mode of Transportation: Car Occupation: Full time employment Type of Occupation: Freight forwarder for [employer] Prior Function Level of Independence: Independent with basic ADLs, Independent with homemaking with ambulation, Independent with transfers, Independent with gait  Able to Take Stairs?: Yes Driving: Yes Vocation: Full time employment Comments: drove, worked outside the home. Vision Baseline Vision/History: Wears glasses Patient Visual Report: No change from baseline Vision Assessment?: No apparent visual deficits Perception  Perception: Within Functional Limits Praxis Praxis: Intact Cognition Overall Cognitive Status: Within Functional Limits for tasks assessed Arousal/Alertness: Awake/alert Orientation Level: Person;Situation;Place Person: Oriented Place: Oriented Situation: Oriented Year: 2022 Month: July Day of Week: Correct Memory: Appears intact Immediate Memory  Recall: Sock;Blue;Bed Memory Recall Sock: Without Cue Memory Recall Blue: Without Cue Memory Recall Bed: Without Cue Sustained Attention: Appears intact Awareness: Appears intact Safety/Judgment: Appears intact Sensation Sensation Light Touch: Appears Intact Hot/Cold: Appears Intact Proprioception: Appears Intact Coordination Gross Motor Movements are Fluid and Coordinated: Yes Fine Motor Movements are Fluid and Coordinated: No Finger Nose Finger Test: slightly slower timing on L side Motor  Motor Motor: Within Functional Limits  Trunk/Postural Assessment  Cervical Assessment Cervical Assessment: Within Functional Limits Thoracic Assessment Thoracic Assessment: Within Functional Limits Lumbar Assessment Lumbar Assessment: Within Functional Limits Postural Control Postural Control: Within Functional Limits  Balance Balance Balance Assessed: Yes Static Sitting Balance Static Sitting - Balance Support: No upper extremity supported Static Sitting - Level of Assistance: 5: Stand by assistance Dynamic Sitting Balance Sitting balance - Comments: Able to maintain sitting balance EOB with supervision. Static Standing Balance Static Standing - Balance Support: Bilateral upper extremity supported Static Standing - Level of Assistance: 5: Stand by assistance Extremity/Trunk Assessment RUE Assessment RUE Assessment: Within Functional Limits LUE Assessment LUE Assessment: Within Functional Limits General Strength Comments: mild strength and [place] deficits  Care Tool Care Tool Self Care Eating   Eating Assist Level: Set up assist    Oral Care    Oral Care Assist Level: Set up assist    Bathing   Body parts bathed by patient: Right arm;Left arm;Chest;Abdomen;Front perineal area;Buttocks;Right upper leg;Left upper leg;Right lower leg;Left lower leg;Face     Assist Level: Supervision/Verbal cueing    Upper Body Dressing(including orthotics)   What is the patient wearing?:  Pull over shirt   Assist Level: Set up assist    Lower Body Dressing (excluding footwear)   What is the patient wearing?: Underwear/pull up;Pants Assist for lower body dressing: Contact Guard/Touching assist    Putting on/Taking off footwear   What is the patient wearing?: Non-skid slipper socks Assist for footwear: Set up assist       Care Tool Toileting Toileting activity   Assist for toileting: Contact Guard/Touching assist     Care Tool Bed Mobility Roll left and right activity   Roll left and right assist level: Supervision/Verbal cueing    Sit to lying activity        Lying to sitting edge of bed activity   Lying to sitting edge of bed assist level: Supervision/Verbal cueing     Care Tool Transfers Sit to stand transfer  Sit to stand assist level: Contact Guard/Touching assist    Chair/bed transfer   Chair/bed transfer assist level: Contact Guard/Touching assist     Toilet transfer   Assist Level: Contact Guard/Touching assist     Care Tool Cognition Expression of Ideas and Wants Expression of Ideas and Wants: Without difficulty (complex and basic) - expresses complex messages without difficulty and with speech that is clear and easy to understand   Understanding Verbal and Non-Verbal Content Understanding Verbal and Non-Verbal Content: Understands (complex and basic) - clear comprehension without cues or repetitions   Memory/Recall Ability *first 3 days only Memory/Recall Ability *first 3 days only: Current season;Staff names and faces;That he or she is in a hospital/hospital unit    Refer to Care Plan for Long Term Goals  SHORT TERM GOAL WEEK 1 OT Short Term Goal 1 (Week 1): STGs=LTGs 2/2 ELOS  Recommendations for other services: Therapeutic Recreation  Kitchen group   Skilled Therapeutic Intervention  Pt received in room I bed and consented to OT eval and treat. Session initiated with OT eval and progressed to self care retraining including bathing,  dressing, toileting, and functional transfer and mobility training. Pt requires min cuing for safety and proper hand placement during transitions. Pt demo's mild strength and FMC deficits in L hand during ADLs, but does not require any physical assistance. After tx, pt left sitting EOB with all needs met and bed alarm on. Pt requires skilled OT to maximize safety and independence with ADLs and to increase [facility] and strength for safe discharge home.    ADL ADL Eating: Set up Grooming: Setup Where Assessed-Grooming: Chair Upper Body Bathing: Setup Where Assessed-Upper Body Bathing: Shower Lower Body Bathing: Supervision/safety Where Assessed-Lower Body Bathing: Shower Where Assessed-Upper Body Dressing: Edge of bed Lower Body Dressing: Setup Where Assessed-Lower Body Dressing: Edge of bed Toileting: Contact guard Where Assessed-Toileting: Glass blower/designer: Therapist, music Method: Ambulating (with RW) Science writer: Bedside commode (positioned over toilet) Tub/Shower Transfer: Not assessed Social research officer, government: Curator Method: Ambulating (with RW and grab bars) Youth worker: Shower seat with back;Grab bars Mobility  Bed Mobility Bed Mobility: Right Sidelying to Sit;Sitting - Scoot to Edge of Bed Right Sidelying to Sit: Supervision/Verbal cueing Sitting - Scoot to Edge of Bed: Supervision/Verbal cueing Transfers Sit to Stand: Contact Guard/Touching assist Stand to Sit: Contact Guard/Touching assist   Discharge Criteria: Patient will be discharged from OT if patient refuses treatment 3 consecutive times without medical reason, if treatment goals not met, if there is a change in medical status, if patient makes no progress towards goals or if patient is discharged from hospital.  The above assessment, treatment plan, treatment alternatives and goals were discussed and mutually agreed upon: by patient  [employer] 06/08/2021, 8:51 AM

## 2021-06-08 NOTE — Progress Notes (Signed)
PROGRESS NOTE   Subjective/Complaints: Would like to see her 42 year old son tomorrow before he does to stay with her 28 year old daughter in [place] Denies pain +numbness/heaviness in left leg  ROS: +numbness and heaviness in left leg   Objective:   No results found. Recent Labs    06/07/21 0448  WBC 10.3  HGB 11.6*  HCT 36.8  PLT 294   Recent Labs    06/07/21 0448  NA 135  K 3.7  CL 104  CO2 22  GLUCOSE 91  BUN 16  CREATININE 0.77  CALCIUM 9.2    Intake/Output Summary (Last 24 hours) at 06/08/2021 1806 Last data filed at 06/08/2021 1254 Gross per 24 hour  Intake 480 ml  Output --  Net 480 ml        Physical Exam: Vital Signs Blood pressure (!) 155/97, pulse 80, temperature 98 F (36.7 C), resp. rate 17, height 5\' 11"  (1.803 m), weight 92.6 kg, SpO2 100 %, unknown if currently breastfeeding. Gen: no distress, normal appearing HEENT: oral mucosa pink and moist, NCAT Cardio: Reg rate Chest: normal effort, normal rate of breathing Abd: soft, non-distended Ext: no edema Psych: pleasant, normal affect Skin: intact Neurological:    Mental Status: She is alert.    Comments: Patient is alert in no acute distress.  Mildly dysarthric.  Oriented to person place and time.  Follows commands. Cognition appears intact. Mild left central 7 and tongue deviation. No sensory abnl. LUE 3+ to 4-/5 prox to distal with decreased FTN and + PD. LLE 4/5 prox to distal. DTR's 1+.   Psychiatric:        Mood and Affect: Mood normal.        Behavior: Behavior normal.        Thought Content: Thought content normal.        Judgment: Judgment normal.   Assessment/Plan: 1. Functional deficits which require 3+ hours per day of interdisciplinary therapy in a comprehensive inpatient rehab setting. Physiatrist is providing close team supervision and 24 hour management of active medical problems listed below. Physiatrist and rehab  team continue to assess barriers to discharge/monitor patient progress toward functional and medical goals  Care Tool:  Bathing    Body parts bathed by patient: Right arm, Left arm, Chest, Abdomen, Front perineal area, Buttocks, Right upper leg, Left upper leg, Right lower leg, Left lower leg, Face         Bathing assist Assist Level: Supervision/Verbal cueing     Upper Body Dressing/Undressing Upper body dressing   What is the patient wearing?: Pull over shirt    Upper body assist Assist Level: Set up assist    Lower Body Dressing/Undressing Lower body dressing      What is the patient wearing?: Underwear/pull up, Pants     Lower body assist Assist for lower body dressing: Contact Guard/Touching assist     Toileting Toileting    Toileting assist Assist for toileting: Contact Guard/Touching assist     Transfers Chair/bed transfer  Transfers assist     Chair/bed transfer assist level: Contact Guard/Touching assist     Locomotion Ambulation   Ambulation assist  Assist level: Minimal Assistance - Patient > 75% Assistive device: No Device Max distance: 90   Walk 10 feet activity   Assist     Assist level: Minimal Assistance - Patient > 75% Assistive device: No Device   Walk 50 feet activity   Assist    Assist level: Minimal Assistance - Patient > 75% Assistive device: No Device    Walk 150 feet activity   Assist      Assistive device: Walker-rolling    Walk 10 feet on uneven surface  activity   Assist     Assist level: Minimal Assistance - Patient > 75% Assistive device: Other (comment) (handrail)   Wheelchair     Assist               Wheelchair 50 feet with 2 turns activity    Assist            Wheelchair 150 feet activity     Assist          Blood pressure (!) 155/97, pulse 80, temperature 98 F (36.7 C), resp. rate 17, height 5\' 11"  (1.803 m), weight 92.6 kg, SpO2 100 %, unknown if  currently breastfeeding.  Medical Problem List and Plan: 1.   Left side weakness secondary to infarction of the right basal ganglia             -patient may shower             -ELOS/Goals: 10-12 days,  mod I with PT, OT, SLP  -Initial CIR evals today 2.  Impaired mobility, ambulating 90 feet -DVT/anticoagulation: SCDs             -antiplatelet therapy: Continue Aspirin 81 mg daily and Plavix 75 mg daily x3 months then aspirin alone 3. Pain Management: Tylenol as needed 4. Mood: Provide emotional support             -antipsychotic agents: N/A 5. Neuropsych: This patient is capable of making decisions on her own behalf. 6. Skin/Wound Care: Routine skin checks 7. Fluids/Electrolytes/Nutrition: Routine in and outs with follow-up chemistries on admit. Vitamin B complex with vitamin C tablet ordered 8.  Permissive hypertension.  Cleviprex discontinued.  Monitor with increased mobility. BMP and Magnesium levels ordered             -BP elevated this morning. Monitor for pattern and rx as needed 9.  Hyperlipidemia.  Continue Lipitor 10.  Urine drug screen positive marijuana.  Provide counseling as appropriate 11. Disposition: will have support of 69 year old daughter. May have grounds pass to see her 21 year old son.     LOS: 1 days A FACE TO FACE EVALUATION WAS PERFORMED  10 Merdith Adan 06/08/2021, 6:06 PM

## 2021-06-08 NOTE — Evaluation (Signed)
Speech Language Pathology Assessment and Plan  Patient Details  Name: [name] Costa MRN: [id number] Date of Birth: 1981-01-22  SLP Diagnosis:   NA Rehab Potential:  NA ELOS: NA    Today's Date: 06/08/2021 SLP Individual Time: 9470-9628 SLP Individual Time Calculation (min): 45 min   Hospital Problem: Principal Problem:   Infarction of right basal ganglia ([place])  Past Medical History:  Past Medical History:  Diagnosis Date   Bladder infection    Trichomonas    Past Surgical History:  Past Surgical History:  Procedure Laterality Date   BUBBLE STUDY  06/06/2021   Procedure: BUBBLE STUDY;  Surgeon: Donato Heinz, MD;  Location: [facility];  Service: Cardiovascular;;   CHOLECYSTECTOMY     CHOLECYSTECTOMY     TEE WITHOUT CARDIOVERSION N/A 06/06/2021   Procedure: TRANSESOPHAGEAL ECHOCARDIOGRAM (TEE);  Surgeon: Donato Heinz, MD;  Location: [facility];  Service: Cardiovascular;  Laterality: N/A;   TUBAL LIGATION Bilateral 04/13/2013   Procedure: POST PARTUM TUBAL LIGATION;  Surgeon: Melina Schools, MD;  Location: [facility];  Service: Gynecology;  Laterality: Bilateral;    Assessment / Plan / Recommendation Clinical Impression  [name] Costa is a 40 year old right-handed female with unremarkable past medical history on no prescription medications.  Per chart review patient lives with her children.  Independent prior to admission and working.  1 level home with level entry.  She does have good family support.  Presented 06/04/2021 with acute onset of left-sided weakness with facial droop.  Elevated systolic blood pressure in the 200s.  Cranial CT scan negative.  CT angiogram of head and neck no emergent findings no stenosis.  Patient did receive tPA.  MRI showed mildly restricted diffusion without flair changes with a right lateral lenticulostriate distribution.  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  No  hemorrhagic conversion.   Therapy evaluations completed due to patient's left-sided weakness decreased functional mobility was admitted 06/07/21 for a comprehensive rehab program.  Pt was seen for SLP evaluation on 06/08/21. SLP administered clinical swallow evaluation (CSE) and cognitive linguistic evaluation this date. Pt reports no difficulty with swallowing prior to CSE. Pt was observed with regular solid (chips) x4 and 3+ consecutive swallows of thin liquids from straw. Pt did not demonstrate any overt s/sx of oropharyngeal dysphagia or aspiration with either consistency at bedside. Rec continuation of regular/thin diet. No further assessment is warranted at this time. SLP administered SLUMS to detect presence of cognitive-linguistic impairment. Pt scored a 28/30 (WFL for tasks assessed). Only errors noted were related to [facility] recall; however, pt was able to retrieve words with single verbal cue. For further assessment, SLP evaluated pt's ability to complete higher-level cognitive task for scheduling. Pt was able to complete efficiently with no errors. Pt appears to be at her baseline level of cognitive functioning; therefore, skilled SLP services are not warranted at this time. Patient in agreement.        Skilled Therapeutic Interventions          Administered clinical swallow evaluation (CSE) and cognitive-linguistic evaluation. See above for details.   SLP Assessment  Patient does not need any further Speech Lanaguage Pathology Services    Recommendations  SLP Diet Recommendations: Age appropriate regular solids;Thin Liquid Administration via: Straw;Cup Medication Administration: Whole meds with liquid Supervision: Patient able to self feed Postural Changes and/or Swallow Maneuvers: Seated upright 90 degrees Oral Care Recommendations: Oral care BID Patient destination: Home Follow up  Recommendations: None Equipment Recommended: None recommended by SLP    SLP Frequency   NA  SLP  Duration  SLP Intensity  SLP Treatment/Interventions NA   NA    NA   Pain Pain Assessment Pain Scale: 0-10  Prior Functioning Type of Home: Apartment (ground level, 1 step to enter)  Lives With: Significant other (will have godson only w/her, works  siblings available to assist) Available Help at Discharge: Family  SLP Evaluation Cognition Overall Cognitive Status: Within Functional Limits for tasks assessed Arousal/Alertness: Awake/alert Orientation Level: Oriented X4 Attention: Sustained Sustained Attention: Appears intact Memory: Appears intact Awareness: Appears intact Problem Solving: Appears intact Safety/Judgment: Appears intact  Comprehension Auditory Comprehension Overall Auditory Comprehension: Appears within functional limits for tasks assessed Expression Expression Primary Mode of Expression: Verbal Verbal Expression Overall Verbal Expression: Appears within functional limits for tasks assessed Written Expression Dominant Hand: Left Written Expression: Within Functional Limits Oral Motor Oral Motor/Sensory Function Overall Oral Motor/Sensory Function: Mild impairment Facial ROM: Reduced left;Suspected CN VII (facial) dysfunction Facial Symmetry: Abnormal symmetry left;Suspected CN VII (facial) dysfunction Facial Strength: Reduced left;Suspected CN VII (facial) dysfunction Facial Sensation: Reduced left;Suspected CN V (Trigeminal) dysfunction Lingual ROM: Within Functional Limits Lingual Symmetry: Abnormal symmetry left;Suspected CN XII (hypoglossal) dysfunction Lingual Strength: Reduced;Suspected CN XII (hypoglossal) dysfunction Mandible: Within Functional Limits Motor Speech Overall Motor Speech: Appears within functional limits for tasks assessed Respiration: Within functional limits Phonation: Normal Resonance: Within functional limits Articulation: Within functional limitis Intelligibility: Intelligible Motor Planning: Witnin functional  limits  Care Tool Care Tool Cognition Expression of Ideas and Wants Expression of Ideas and Wants: Without difficulty (complex and basic) - expresses complex messages without difficulty and with speech that is clear and easy to understand   Understanding Verbal and Non-Verbal Content Understanding Verbal and Non-Verbal Content: Understands (complex and basic) - clear comprehension without cues or repetitions   Memory/Recall Ability *first 3 days only Memory/Recall Ability *first 3 days only: That he or she is in a hospital/hospital unit;Location of own room;Staff names and faces;Current season      Bedside Swallowing Assessment General Date of Onset: 06/04/21 Previous Swallow Assessment: MBS on 7/6; D3 textures w/ thin liquids. Upgraded on 7/8 to reg/thin. Diet Prior to this Study: Regular;Thin liquids Respiratory Status: Room air History of Recent Intubation: No Behavior/Cognition: Alert;Cooperative;Pleasant mood Oral Cavity - Dentition: Adequate natural dentition Vision: Functional for self-feeding Patient Positioning: Upright in bed Baseline Vocal Quality: Normal Volitional Cough: Strong Volitional Swallow: Able to elicit     Ice Chips Ice chips: Not tested Thin Liquid Thin Liquid: Within functional limits Presentation: Cup;Straw Nectar Thick Nectar Thick Liquid: Not tested Honey Thick Honey Thick Liquid: Not tested Puree Puree: Not tested Solid Solid: Within functional limits Presentation: Self Fed BSE Assessment Risk for Aspiration Impact on safety and function: No limitations  Short Term Goals: NA  Refer to Care Plan for Long Term Goals  Recommendations for other services: None   Discharge Criteria: Patient will be discharged from SLP if patient refuses treatment 3 consecutive times without medical reason, if treatment goals not met, if there is a change in medical status, if patient makes no progress towards goals or if patient is discharged from  hospital.  The above assessment, treatment plan, treatment alternatives and goals were discussed and mutually agreed upon: by patient  [place], CCC-SLP, CBIS  06/08/2021, 2:18 PM

## 2021-06-08 NOTE — Evaluation (Signed)
Physical Therapy Assessment and Plan  Patient Details  Name: [name] Costa MRN: [id number] Date of Birth: [birth year]  PT Diagnosis: Hemiplegia non-dominant Rehab Potential: Excellent ELOS: 7-10 days   Today's Date: 06/08/2021 PT Individual Time: 1000-1100 PT Individual Time Calculation (min): 60 min    Hospital Problem: Principal Problem:   Infarction of right basal ganglia ([place])   Past Medical History:  Past Medical History:  Diagnosis Date   Bladder infection    Trichomonas    Past Surgical History:  Past Surgical History:  Procedure Laterality Date   BUBBLE STUDY  06/06/2021   Procedure: BUBBLE STUDY;  Surgeon: Donato Heinz, MD;  Location: [facility];  Service: Cardiovascular;;   CHOLECYSTECTOMY     CHOLECYSTECTOMY     TEE WITHOUT CARDIOVERSION N/A 06/06/2021   Procedure: TRANSESOPHAGEAL ECHOCARDIOGRAM (TEE);  Surgeon: Donato Heinz, MD;  Location: [facility];  Service: Cardiovascular;  Laterality: N/A;   TUBAL LIGATION Bilateral 04/13/2013   Procedure: POST PARTUM TUBAL LIGATION;  Surgeon: Melina Schools, MD;  Location: [facility];  Service: Gynecology;  Laterality: Bilateral;    Assessment & Plan Clinical Impression:  [name] Costa is a 40 year old right-handed female with unremarkable past medical history on no prescription medications.  Per chart review patient lives with her children.  Independent prior to admission and working.  1 level home with level entry.  She does have good family support.  Presented 06/04/2021 with acute onset of left-sided weakness with facial droop.  Elevated systolic blood pressure in the 200s.  Cranial CT scan negative.  CT angiogram of head and neck no emergent findings no stenosis.  Patient did receive tPA.  MRI showed mildly restricted diffusion without flair changes with a right lateral lenticulostriate distribution.  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   No hemorrhagic conversion.  Admission chemistries unremarkable except glucose 112, WBC 11,100, hemoglobin 10.5, urine drug screen positive marijuana.  Echocardiogram with ejection fraction of 65 to 70% no wall motion abnormalities.  TEE completed 06/06/2021 showing no thrombus or vegetation.  Patient had initially been placed on Cleviprex for blood pressure controlled and monitored.  Placed on aspirin 81 mg daily and Plavix 75 mg daily x3 months then aspirin alone.  Maintained on a mechanical soft diet.  Patient transferred to CIR on 06/07/2021 .   Patient currently requires min with mobility secondary to muscle weakness, decreased cardiorespiratoy endurance, and impaired timing and sequencing, unbalanced muscle activation, and decreased coordination.  Prior to hospitalization, patient was independent  with mobility and lived with Significant other (will have godson only w/her, works  siblings available to assist) in a Apartment (ground level, 1 step to enter) home.  Home access is 1Stairs to enter. Pt primary deficits involve proximal UE AND LE weakness and significant balance deficits resulting in impaired gait function. She is very motivated w/excellent potential for return to independent level of functionaing w/skilled IPR services.   Patient will benefit from skilled PT intervention to maximize safe functional mobility, minimize fall risk, and decrease caregiver burden for planned discharge home with intermittent assist.  Anticipate patient will benefit from follow up OP at discharge.  PT - End of Session Activity Tolerance: Tolerates 30+ min activity with multiple rests Endurance Deficit Description: requires multiple rests during session PT Assessment Rehab Potential (ACUTE/IP ONLY): Excellent PT Patient demonstrates impairments in the following area(s): Balance;Endurance;Motor;Safety PT Transfers Functional Problem(s): Bed to Chair;Car;Furniture;Floor PT Locomotion Functional Problem(s):  Ambulation;Stairs  PT Plan PT Intensity: Minimum of 1-2 x/day ,45 to 90 minutes PT Frequency: 5 out of 7 days PT Duration Estimated Length of Stay: 7-10 days PT Treatment/Interventions: Ambulation/gait training;DME/adaptive equipment instruction;Neuromuscular re-education;Psychosocial support;Stair training;UE/LE Strength taining/ROM;Balance/vestibular training;Discharge planning;Therapeutic Activities;UE/LE Coordination activities;Disease management/prevention;Functional mobility training;Patient/family education;Therapeutic Exercise PT Transfers Anticipated Outcome(s): independent PT Locomotion Anticipated Outcome(s): mod I PT Recommendation Follow Up Recommendations: Outpatient PT Patient destination: Home Equipment Recommended: To be determined Equipment Details: LRAD to be determined   PT Evaluation Precautions/Restrictions Precautions Precautions: Fall General   Vital Signs Pain Pain Assessment Pain Scale: 0-10 Home Living/Prior Functioning Home Living Available Help at Discharge: Family Type of Home: Apartment (ground level, 1 step to enter) Home Access: Stairs to enter [employer] of Steps: 1 Home Layout: One level Bathroom Shower/Tub: Optometrist: Yes  Lives With: Significant other (will have godson only w/her, works  siblings available to assist) Vision/Perception     Cognition Overall Cognitive Status: Within Functional Limits for tasks assessed Arousal/Alertness: Awake/alert Orientation Level: Oriented X4 Attention: Sustained Sustained Attention: Appears intact Memory: Appears intact Sensation Sensation Light Touch: Appears Intact Hot/Cold: Appears Intact Proprioception: Appears Intact Motor  Motor Motor: Hemiplegia   Trunk/Postural Assessment  Cervical Assessment Cervical Assessment: Within Functional Limits Thoracic Assessment Thoracic Assessment: Within Functional Limits Lumbar  Assessment Lumbar Assessment: Within Functional Limits Postural Control Postural Control: Deficits on evaluation Protective Responses: delayed balance reactions LLE, decreased ankle strategy, delayed stepping strategy, decreased LUE reaction to post perturbation  Balance Balance Balance Assessed: Yes Standardized Balance Assessment Standardized Balance Assessment: Berg Balance Test Berg Balance Test Sit to Stand: Needs minimal aid to stand or to stabilize Standing Unsupported: Able to stand 2 minutes with supervision Sitting with Back Unsupported but Feet Supported on Floor or Stool: Able to sit safely and securely 2 minutes Stand to Sit: Controls descent by using hands Transfers: Able to transfer with verbal cueing and /or supervision Standing Unsupported with Eyes Closed: Unable to keep eyes closed 3 seconds but stays steady Standing Ubsupported with Feet Together: Able to place feet together independently but unable to hold for 30 seconds From Standing, Reach Forward with Outstretched Arm: Can reach forward >12 cm safely (5") From Standing Position, Pick up Object from Floor: Able to pick up shoe, needs supervision From Standing Position, Turn to Look Behind Over each Shoulder: Looks behind one side only/other side shows less weight shift Turn 360 Degrees: Able to turn 360 degrees safely but slowly Standing Unsupported, Alternately Place Feet on Step/Stool: Able to complete >2 steps/needs minimal assist Standing Unsupported, One Foot in Front: Loses balance while stepping or standing Standing on One Leg: Tries to lift leg/unable to hold 3 seconds but remains standing independently Total Score: 29 Static Sitting Balance Static Sitting - Balance Support: No upper extremity supported Static Sitting - Level of Assistance: 7: Independent Dynamic Sitting Balance Sitting balance - Comments: maintains sitting balance while scooting , reaching for phone, transitioning sit to from  stand Extremity Assessment  RUE Assessment RUE Assessment: Within Functional Limits LUE Assessment LUE Assessment: Exceptions to [facility] General Strength Comments: proximal deficits greater than distal.  shoulder abd and flexion 4-/5, elbow flex/ext 4+/5 RLE Assessment RLE Assessment: Within Functional Limits LLE Assessment LLE Assessment: Exceptions to [facility] General Strength Comments: hip 3-/5 flexion, abd, extension, quads 4+/5, hamdstrings 4/5, DF 4/5  Care Tool Care Tool Bed Mobility Roll left and right activity   Roll left and right assist level: Independent    Sit  to lying activity   Sit to lying assist level: Independent    Lying to sitting edge of bed activity   Lying to sitting edge of bed assist level: Supervision/Verbal cueing     Care Tool Transfers Sit to stand transfer   Sit to stand assist level: Contact Guard/Touching assist    Chair/bed transfer   Chair/bed transfer assist level: Contact Guard/Touching assist     Physiological scientist transfer assist level: Contact Guard/Touching assist      Care Tool Locomotion Ambulation   Assist level: Minimal Assistance - Patient > 75% Assistive device: No Device Max distance: 90  Walk 10 feet activity   Assist level: Minimal Assistance - Patient > 75% Assistive device: No Device   Walk 50 feet with 2 turns activity   Assist level: Minimal Assistance - Patient > 75% Assistive device: No Device  Walk 150 feet activity     Assistive device: Walker-rolling  Walk 10 feet on uneven surfaces activity   Assist level: Minimal Assistance - Patient > 75% Assistive device: Other (comment) (handrail)  Stairs   Assist level: Contact Guard/Touching assist Stairs assistive device: 2 hand rails Max number of stairs: 4  Walk up/down 1 step activity   Walk up/down 1 step (curb) assist level: Minimal Assistance - Patient > 75% Walk up/down 1 step or curb assistive device: 1 hand rail    Walk up/down 4  steps activity Walk up/down 4 steps assist level: Minimal Assistance - Patient > 75% Walk up/down 4 steps assistive device: 2 hand rails  Walk up/down 12 steps activity Walk up/down 12 steps activity did not occur: Safety/medical concerns      Pick up small objects from floor        Wheelchair            Wheel 50 feet with 2 turns activity      Wheel 150 feet activity        Refer to Care Plan for Long Term Goals  SHORT TERM GOAL WEEK 1 PT Short Term Goal 1 (Week 1): LTG=STG  Recommendations for other services: None   Skilled Therapeutic Intervention  Evaluation completed (see details above and below) with education on PT POC and goals and individual treatment initiated with focus on functional mobility/transfers, LE strength, dynamic standing balance/coordination, ambulation, stair navigation, simulated car transfers, and improved endurance with activity Pt oriented to rehab unit.  Discussed process of daily scheduling and receiving schedule, purpose  And team conference schedule and D/c planning.  Pt provided w/ wheelchair and cushion and adjustments made to promote optimal seating posture and pressure distribution.  Pt also provided w/ RW and therapist adjusted to proper height for patient. Pt treament included full BERG assessment and balance retraining activites following assessment.  Pt instructed w/sidelying clamshells and supne single leg bridge to address proximal hip weakness while in bed.   At end of session, Pt left seated on edge of bed, alarm set, bed in lowest position, and needs in reach.  Mobility Bed Mobility Bed Mobility: Rolling Right;Rolling Left;Supine to Sit;Sit to Supine Rolling Right: Independent Rolling Left: Independent Right Sidelying to Sit: Supervision/Verbal cueing Supine to Sit: Supervision/Verbal cueing Sitting - Scoot to Edge of Bed: Supervision/Verbal cueing Sit to Supine: Supervision/Verbal cueing Transfers Transfers: Sit to Stand;Stand  Pivot Transfers;Stand to Sit Sit to Stand: Contact Guard/Touching assist Stand to Sit: Contact Guard/Touching assist Stand Pivot Transfers: Contact Guard/Touching assist Transfer (  Assistive device): None Locomotion  Gait Ambulation: Yes Gait Assistance: Minimal Assistance - Patient > 75% Gait Distance (Feet): 90 Feet Assistive device: None Gait Assistance Details: min assist for steadying, cues for focus on L knee control Gait Gait: Yes Gait Pattern: Impaired (decrease step length L, liminted hip and knee flexion thru swing, L knee wobbles w/occasional extension thrust at loading, decreased stance time LLE, step to gait w/RLE) Gait velocity: reduced Stairs / Additional Locomotion Stairs: Yes Stairs Assistance: Contact Guard/Touching assist Stair Management Technique: Two rails Number of Stairs: 4 Height of Stairs: 6 Ramp: Minimal Assistance - Patient >75% Curb: Minimal Assistance - Patient >75% Wheelchair Mobility Wheelchair Mobility: No   Discharge Criteria: Patient will be discharged from PT if patient refuses treatment 3 consecutive times without medical reason, if treatment goals not met, if there is a change in medical status, if patient makes no progress towards goals or if patient is discharged from hospital.  The above assessment, treatment plan, treatment alternatives and goals were discussed and mutually agreed upon: by patient  Callie Fielding, [place] 06/08/2021, 12:30 PM

## 2021-06-09 LAB — BASIC METABOLIC PANEL WITH GFR
Anion gap: 8 (ref 5–15)
BUN: 10 mg/dL (ref 6–20)
CO2: 25 mmol/L (ref 22–32)
Calcium: 9.2 mg/dL (ref 8.9–10.3)
Chloride: 103 mmol/L (ref 98–111)
Creatinine, Ser: 0.68 mg/dL (ref 0.44–1.00)
GFR, Estimated: >60 mL/min
Glucose, Bld: 108 mg/dL — ABNORMAL HIGH (ref 70–99)
Potassium: 3.5 mmol/L (ref 3.5–5.1)
Sodium: 136 mmol/L (ref 135–145)

## 2021-06-09 LAB — VITAMIN B12: Vitamin B-12: 474 pg/mL (ref 180–914)

## 2021-06-09 LAB — VITAMIN D 25 HYDROXY (VIT D DEFICIENCY, FRACTURES): Vit D, 25-Hydroxy: 12 ng/mL — ABNORMAL LOW (ref 30–100)

## 2021-06-09 LAB — MAGNESIUM: Magnesium: 1.7 mg/dL (ref 1.7–2.4)

## 2021-06-09 MED ORDER — AMLODIPINE BESYLATE 5 MG PO TABS
5.0000 mg | ORAL_TABLET | Freq: Every day | ORAL | Status: DC
  Administered 2021-06-09 – 2021-06-11 (×3): 5 mg via ORAL
  Filled 2021-06-09 (×3): qty 1

## 2021-06-09 NOTE — Discharge Instructions (Addendum)
Inpatient Rehab Discharge Instructions  [name] Costa Discharge date and time: No discharge date for patient encounter.   Activities/Precautions/ Functional Status: Activity: As tolerated Diet: Regular Wound Care: Routine skin checks Functional status:  ___ No restrictions     ___ Walk up steps independently ___ 24/7 supervision/assistance   ___ Walk up steps with assistance ___ Intermittent supervision/assistance  _x__ Bathe/dress independently ___ Walk with walker     ___ Bathe/dress with assistance ___ Walk Independently    ___ Shower independently ___ Walk with assistance    ___ Shower with assistance ___ No alcohol     ___ Return to work/school ________  Special Instructions: No driving smoking or alcohol  Continue aspirin 81 mg daily and Plavix 75 mg daily x90 days total then aspirin alone    COMMUNITY REFERRALS UPON DISCHARGE:    Outpatient: PT             Agency:[facility] Phone:[phone]              Appointment Date/Time:WILL CALL PATIENT TO SET UP APPOINTMENT  Medical Equipment/Items Ordered:NO NEEDS-GET TUB SEAT ON OWN                                                 Agency/Supplier:NA  My questions have been answered and I understand these instructions. I will adhere to these goals and the provided educational materials after my discharge from the hospital.  Patient/Caregiver Signature _______________________________ Date __________  Clinician Signature _______________________________________ Date __________  Please bring this form and your medication list with you to all your follow-up doctor's appointments.  STROKE/TIA DISCHARGE INSTRUCTIONS SMOKING Cigarette smoking nearly doubles your risk of having a stroke & is the single most alterable risk factor  If you smoke or have smoked in the last 12 months, you are advised to quit smoking for your health. Most of the excess cardiovascular risk related to smoking disappears within a year of  stopping. Ask you doctor about anti-smoking medications [facility] Quit Line: 1-800-QUIT NOW Free Smoking Cessation Classes [phone]  CHOLESTEROL Know your levels; limit fat & cholesterol in your diet  Lipid Panel     Component Value Date/Time   CHOL 188 06/05/2021 0150   TRIG 88 06/05/2021 0150   HDL 55 06/05/2021 0150   CHOLHDL 3.4 06/05/2021 0150   VLDL 18 06/05/2021 0150   LDLCALC 115 (H) 06/05/2021 0150     Many patients benefit from treatment even if their cholesterol is at goal. Goal: Total Cholesterol (CHOL) less than 160 Goal:  Triglycerides (TRIG) less than 150 Goal:  HDL greater than 40 Goal:  LDL (LDLCALC) less than 100   BLOOD PRESSURE American Stroke Association blood pressure target is less that 120/80 mm/Hg  Your discharge blood pressure is:  BP: (!) 170/94 Monitor your blood pressure Limit your salt and alcohol intake Many individuals will require more than one medication for high blood pressure  DIABETES (A1c is a blood sugar average for last 3 months) Goal HGBA1c is under 7% (HBGA1c is blood sugar average for last 3 months)  Diabetes: No known diagnosis of diabetes    Lab Results  Component Value Date   HGBA1C 5.4 06/05/2021    Your HGBA1c can be lowered with medications, healthy diet, and exercise. Check your blood sugar as directed by your physician Call your physician if  you experience unexplained or low blood sugars.  PHYSICAL ACTIVITY/REHABILITATION Goal is 30 minutes at least 4 days per week  Activity: Increase activity slowly, Therapies: Physical Therapy: Home Health Return to work:  Activity decreases your risk of heart attack and stroke and makes your heart stronger.  It helps control your weight and blood pressure; helps you relax and can improve your mood. Participate in a regular exercise program. Talk with your doctor about the best form of exercise for you (dancing, walking, swimming, cycling).  DIET/WEIGHT Goal is to maintain a healthy weight   Your discharge diet is:  Diet Order             Diet regular Room service appropriate? Yes; Fluid consistency: Thin  Diet effective now                   liquids Your height is:  Height: 5\' 11"  (180.3 cm) Your current weight is: Weight: 93.7 kg Your Body Mass Index (BMI) is:  BMI (Calculated): 28.82 Following the type of diet specifically designed for you will help prevent another stroke. Your goal weight range is:   Your goal Body Mass Index (BMI) is 19-24. Healthy food habits can help reduce 3 risk factors for stroke:  High cholesterol, hypertension, and excess weight.  RESOURCES Stroke/Support Group:  Call [phone]   STROKE EDUCATION PROVIDED/REVIEWED AND GIVEN TO PATIENT Stroke warning signs and symptoms How to activate emergency medical system (call 911). Medications prescribed at discharge. Need for follow-up after discharge. Personal risk factors for stroke. Pneumonia vaccine given: No Flu vaccine given: No My questions have been answered, the writing is legible, and I understand these instructions.  I will adhere to these goals & educational materials that have been provided to me after my discharge from the hospital.

## 2021-06-09 NOTE — IPOC Note (Signed)
Overall Plan of Care [facility]) Patient Details Name: Makenize L Eyster MRN: [id number] DOB: [birth year]  Admitting Diagnosis: Infarction of right basal ganglia [facility])  Hospital Problems: Principal Problem:   Infarction of right basal ganglia (HCC)     Functional Problem List: Nursing Medication Management, Endurance, Safety  PT Balance, Endurance, Motor, Safety  OT Balance, Endurance, Motor  SLP    TR         Basic ADL's: OT Bathing, Grooming, Dressing, Toileting     Advanced  ADL's: OT Simple Meal Preparation, Light Housekeeping     Transfers: PT Bed to Chair, Car, Furniture, Floor  OT Tub/Shower, Technical brewer: PT Ambulation, Stairs     Additional Impairments: OT Fuctional Use of Upper Extremity  SLP None      TR      Anticipated Outcomes Item Anticipated Outcome  Self Feeding independent  Swallowing      Basic self-care  mod I  Toileting  mod I   Bathroom Transfers supervision  Bowel/Bladder  manage bowel with mod I assist  Transfers  independent  Locomotion  mod I  Communication     Cognition     Pain  n/a  Safety/Judgment  maintain safety with cues/reminders   Therapy Plan: PT Intensity: Minimum of 1-2 x/day ,45 to 90 minutes PT Frequency: 5 out of 7 days PT Duration Estimated Length of Stay: 7-10 days OT Intensity: Minimum of 1-2 x/day, 45 to 90 minutes OT Frequency: 5 out of 7 days OT Duration/Estimated Length of Stay: 7-10 days SLP Duration/Estimated Length of Stay: NA   Due to the current state of emergency, patients may not be receiving their 3-hours of Medicare-mandated therapy.   Team Interventions: Nursing Interventions Patient/Family Education, Discharge Planning, Medication Management, Disease Management/Prevention, Bowel Management  PT interventions Ambulation/gait training, DME/adaptive equipment instruction, Neuromuscular re-education, Psychosocial support, Stair training, UE/LE Strength taining/ROM, Designer, jewellery, Discharge planning, Therapeutic Activities, UE/LE Coordination activities, Disease management/prevention, Functional mobility training, Patient/family education, Therapeutic Exercise  OT Interventions Balance/vestibular training, Neuromuscular re-education, Self Care/advanced ADL retraining, Therapeutic Exercise, UE/LE Strength taining/ROM, Skin care/wound managment, Pain management, DME/adaptive equipment instruction, Community reintegration, Patient/family education, UE/LE Coordination activities, Therapeutic Activities, Functional mobility training, Discharge planning  SLP Interventions    TR Interventions    SW/CM Interventions     Barriers to Discharge MD  Medical stability  Nursing Decreased caregiver support 1 level home with adult daughter/1 minor child (autistic)  PT      OT Decreased caregiver support, Home environment access/layout limited caregiver support  SLP      SW       Team Discharge Planning: Destination: PT-Home ,OT- Home , SLP-Home Projected Follow-up: PT-Outpatient PT, OT-  Home health OT, SLP-None Projected Equipment Needs: PT-To be determined, OT- 3 in 1 bedside comode, Tub/shower bench, To be determined, SLP-None recommended by SLP Equipment Details: PT-LRAD to be determined, OT-  Patient/family involved in discharge planning: PT- Patient,  OT-Patient, SLP-Patient  MD ELOS: 7-10 days Medical Rehab Prognosis:  Excellent Assessment: The patient has been admitted for CIR therapies with the diagnosis of 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 The team will be addressing functional mobility, strength, stamina, balance, safety, adaptive techniques and equipment, self-care, bowel and bladder mgt, patient and caregiver education, NMR, community reentry. Goals have been set at mod I with self-care and mobility. Pt highly motivated. .   Due to the current state of emergency, patients may not be receiving their 3 hours per day  of Medicare-mandated  therapy.    Ranelle Oyster, MD, FAAPMR     See Team Conference Notes for weekly updates to the plan of care

## 2021-06-09 NOTE — Progress Notes (Signed)
Patient slept well throughout the night. No c/o pain or discomfort. Pt noted with consistent with SBP low to mid 160s and DBP high 90s. Manual recheck of Bps this shift to verify accuracy. Pt informed this nurse that she was told that she would be on BP meds, none noted in record. Secure chat sent to Dr. Carlis Abbott for followup.

## 2021-06-09 NOTE — Progress Notes (Signed)
Patient was not in room this morning so I did not see her- received notification from RN that she was asking about BP medication. Discussed that she is in permissive HTN but since systolic >160 will start amlodipine 5mg  daily for her.

## 2021-06-10 LAB — CBC WITH DIFFERENTIAL/PLATELET
Abs Immature Granulocytes: 0.04 10*3/uL (ref 0.00–0.07)
Basophils Absolute: 0 10*3/uL (ref 0.0–0.1)
Basophils Relative: 0 %
Eosinophils Absolute: 0.2 10*3/uL (ref 0.0–0.5)
Eosinophils Relative: 3 %
HCT: 34.2 % — ABNORMAL LOW (ref 36.0–46.0)
Hemoglobin: 10.8 g/dL — ABNORMAL LOW (ref 12.0–15.0)
Immature Granulocytes: 1 %
Lymphocytes Relative: 37 %
Lymphs Abs: 2.7 10*3/uL (ref 0.7–4.0)
MCH: 24.8 pg — ABNORMAL LOW (ref 26.0–34.0)
MCHC: 31.6 g/dL (ref 30.0–36.0)
MCV: 78.4 fL — ABNORMAL LOW (ref 80.0–100.0)
Monocytes Absolute: 0.5 10*3/uL (ref 0.1–1.0)
Monocytes Relative: 7 %
Neutro Abs: 3.9 10*3/uL (ref 1.7–7.7)
Neutrophils Relative %: 52 %
Platelets: 266 10*3/uL (ref 150–400)
RBC: 4.36 MIL/uL (ref 3.87–5.11)
RDW: 17.3 % — ABNORMAL HIGH (ref 11.5–15.5)
WBC: 7.3 10*3/uL (ref 4.0–10.5)
nRBC: 0 % (ref 0.0–0.2)

## 2021-06-10 LAB — COMPREHENSIVE METABOLIC PANEL
ALT: 9 U/L (ref 0–44)
AST: 14 U/L — ABNORMAL LOW (ref 15–41)
Albumin: 2.9 g/dL — ABNORMAL LOW (ref 3.5–5.0)
Alkaline Phosphatase: 55 U/L (ref 38–126)
Anion gap: 7 (ref 5–15)
BUN: 10 mg/dL (ref 6–20)
CO2: 25 mmol/L (ref 22–32)
Calcium: 8.9 mg/dL (ref 8.9–10.3)
Chloride: 105 mmol/L (ref 98–111)
Creatinine, Ser: 0.67 mg/dL (ref 0.44–1.00)
GFR, Estimated: >60 mL/min (ref >60)
Glucose, Bld: 99 mg/dL (ref 70–99)
Potassium: 3.9 mmol/L (ref 3.5–5.1)
Sodium: 137 mmol/L (ref 135–145)
Total Bilirubin: 0.5 mg/dL (ref 0.3–1.2)
Total Protein: 5.9 g/dL — ABNORMAL LOW (ref 6.5–8.1)

## 2021-06-10 MED ORDER — MAGNESIUM GLUCONATE 500 MG PO TABS
500.0000 mg | ORAL_TABLET | Freq: Every day | ORAL | Status: DC
  Administered 2021-06-10 – 2021-06-13 (×4): 500 mg via ORAL
  Filled 2021-06-10 (×4): qty 1

## 2021-06-10 NOTE — Progress Notes (Addendum)
PROGRESS NOTE   Subjective/Complaints: In good spirits. Working hard to get back to "where I need to be.". met with 42 yo daughter yesterday which was good for her emotionally.   ROS: Patient denies fever, rash, sore throat, blurred vision, nausea, vomiting, diarrhea, cough, shortness of breath or chest pain, joint or back pain, headache, or mood change.    Objective:   No results found. Recent Labs    06/10/21 0718  WBC 7.3  HGB 10.8*  HCT 34.2*  PLT 266   Recent Labs    06/09/21 0507 06/10/21 0718  NA 136 137  K 3.5 3.9  CL 103 105  CO2 25 25  GLUCOSE 108* 99  BUN 10 10  CREATININE 0.68 0.67  CALCIUM 9.2 8.9    Intake/Output Summary (Last 24 hours) at 06/10/2021 1215 Last data filed at 06/10/2021 1000 Gross per 24 hour  Intake 600 ml  Output --  Net 600 ml        Physical Exam: Vital Signs Blood pressure (!) 170/94, pulse 60, temperature 98.4 F (36.9 C), temperature source Oral, resp. rate 16, height _0  (1.803 m), weight 95.3 kg, SpO2 100 %, unknown if currently breastfeeding. Constitutional: No distress . Vital signs reviewed. HEENT: EOMI, oral membranes moist Neck: supple Cardiovascular: RRR without murmur. No JVD    Respiratory/Chest: CTA Bilaterally without wheezes or rales. Normal effort    GI/Abdomen: BS +, non-tender, non-distended Ext: no clubbing, cyanosis, or edema Psych: pleasant and cooperative  Neurological:    Mental Status: She is alert.    Comments: Alert and oriented x 3. Normal insight and awareness. Intact Memory. Normal language and speech.   Mild left central 7 and tongue deviation but normal speech.  LUE   4-/5 prox to distal with decreased FTN and + PD. LLE 4/5 prox to distal. DTR's 1+.  sl difference in proprioception LUE. Good sitting balance     Assessment/Plan: 1. Functional deficits which require 3+ hours per day of interdisciplinary therapy in a comprehensive  inpatient rehab setting. Physiatrist is providing close team supervision and 24 hour management of active medical problems listed below. Physiatrist and rehab team continue to assess barriers to discharge/monitor patient progress toward functional and medical goals  Care Tool:  Bathing    Body parts bathed by patient: Right arm, Left arm, Chest, Abdomen, Front perineal area, Buttocks, Right upper leg, Left upper leg, Right lower leg, Left lower leg, Face         Bathing assist Assist Level: Supervision/Verbal cueing     Upper Body Dressing/Undressing Upper body dressing   What is the patient wearing?: Pull over shirt    Upper body assist Assist Level: Set up assist    Lower Body Dressing/Undressing Lower body dressing      What is the patient wearing?: Underwear/pull up, Pants     Lower body assist Assist for lower body dressing: Contact Guard/Touching assist     Toileting Toileting    Toileting assist Assist for toileting: Contact Guard/Touching assist     Transfers Chair/bed transfer  Transfers assist     Chair/bed transfer assist level: Contact Guard/Touching assist     Locomotion  Ambulation   Ambulation assist      Assist level: Minimal Assistance - Patient > 75% Assistive device: No Device Max distance: 90   Walk 10 feet activity   Assist     Assist level: Contact Guard/Touching assist Assistive device: Walker-rolling   Walk 50 feet activity   Assist    Assist level: Minimal Assistance - Patient > 75% Assistive device: No Device    Walk 150 feet activity   Assist      Assistive device: Walker-rolling    Walk 10 feet on uneven surface  activity   Assist     Assist level: Minimal Assistance - Patient > 75% Assistive device: Other (comment) (handrail)   Wheelchair     Assist               Wheelchair 50 feet with 2 turns activity    Assist            Wheelchair 150 feet activity     Assist           Blood pressure (!) 170/94, pulse 60, temperature 98.4 F (36.9 C), temperature source Oral, resp. rate 16, height _0  (1.803 m), weight 95.3 kg, SpO2 100 %, unknown if currently breastfeeding.  Medical Problem List and Plan: 1.   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 SLP d/c'ed her  -grounds pass prn 2.  Impaired mobility, ambulating 90 feet -DVT/anticoagulation: SCDs             -antiplatelet therapy: Continue Aspirin 81 mg daily and Plavix 75 mg daily x3 months then aspirin alone 3. Pain Management: Tylenol as needed 4. Mood: Provide emotional support             -antipsychotic agents: N/A 5. Neuropsych: This patient is capable of making decisions on her own behalf. 6. Skin/Wound Care: Routine skin checks 7. Fluids/Electrolytes/Nutrition: Routine in and outs with follow-up chemistries on admit. Vitamin B complex with vitamin C tablet ordered  -mg supplement  for 1.7. albumin low. Otherwise labs look good 8.  Permissive hypertension.  Cleviprex discontinued.  Monitor with increased mobility.    -norvasc 6m since 7/10              -BP elevated this morning 7/11---observe for pattern 9.  Hyperlipidemia.  Continue Lipitor 10.  Urine drug screen positive marijuana.  Provide counseling as appropriate     LOS: 3 days A FACE TO FACE EVALUATION WAS PERFORMED  ZMeredith Staggers7/10/2021, 12:15 PM

## 2021-06-10 NOTE — Progress Notes (Signed)
Inpatient Rehabilitation Care Coordinator Assessment and Plan Patient Details  Name: [name] Costa MRN: [id number] Date of Birth: [birth year]  Today's Date: 06/10/2021  Hospital Problems: Principal Problem:   Infarction of right basal ganglia [facility])  Past Medical History:  Past Medical History:  Diagnosis Date   Bladder infection    Trichomonas    Past Surgical History:  Past Surgical History:  Procedure Laterality Date   BUBBLE STUDY  06/06/2021   Procedure: BUBBLE STUDY;  Surgeon: [name] Ishikawa, MD;  Location: [facility] ENDOSCOPY;  Service: Cardiovascular;;   CHOLECYSTECTOMY     CHOLECYSTECTOMY     TEE WITHOUT CARDIOVERSION N/A 06/06/2021   Procedure: TRANSESOPHAGEAL ECHOCARDIOGRAM (TEE);  Surgeon: [name] Ishikawa, MD;  Location: [facility] ENDOSCOPY;  Service: Cardiovascular;  Laterality: N/A;   TUBAL LIGATION Bilateral 04/13/2013   Procedure: POST PARTUM TUBAL LIGATION;  Surgeon: Bing Plume, MD;  Location: [facility] ORS;  Service: Gynecology;  Laterality: Bilateral;   Social History:  reports that she has never smoked. She has never used smokeless tobacco. She reports that she does not drink alcohol and does not use drugs.  Family / Support Systems Marital Status: Divorced Patient Roles: Parent, Other (Comment) (employee) Children: [name] Costa-daughter [phone] Other Supports: [name] Costa son and friends Anticipated Caregiver: Family and friends Ability/Limitations of Caregiver: Daughter moving to [place] and taking pt's 42 yo until school starts with her Caregiver Availability: 24/7 [name] Costa will be home with her) Family Dynamics: Close knit with family members and they will all pull together to assist her with whatever she needs. 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 Recent Psychosocial Issues: Pt thought she was healthy and was not on any meds or went to the MD Psychiatric History: No issues deferred depression screen coping appropriately and future oriented-optimistic regarding recovery from this CVA Substance Abuse History: THC positive made aware of risk involved and plans to quit aware of resources if needed  Patient / Family Perceptions, Expectations & Goals Pt/Family understanding of illness & functional limitations: Pt and daughter can explain her stroke and deficits, she talks with the MD daily and feels her questions and concerns have been addressed. She hopes to be here a short time and get back home soon Premorbid pt/family roles/activities: Mom, godmother, friend, employee, etc Anticipated changes in roles/activities/participation: resume Pt/family expectations/goals: Pt states: " I hope to continue to make progress and go home managing myself."  Daughter states: " We will do what is needed for her she would for [place]."  [employer]: None Premorbid Home Care/DME Agencies: None Transportation available at discharge: Family/friends  Discharge Planning Living Arrangements: Other relatives Support Systems: Children, Other relatives, Friends/neighbors Type of Residence: Private residence Insurance Resources: Media planner (specify) Herbalist) Financial Resources:  Employment Financial Screen Referred: No Living Expenses: Rent Money Management: Patient Does the patient have any problems  obtaining your medications?: No Home Management: Self Patient/Family Preliminary Plans: Return home with her 40 yo [name] Costa who can be there with her. Her daughter is taking 36 yo son to [place] with her until school starts and then pt's best friend will stay with her if needed. Pt is high level and will be short LOS here. Care Coordinator Barriers to Discharge: Insurance for SNF coverage Care Coordinator Anticipated Follow Up Needs: HH/OP  Clinical Impression Pleasant high level female who looks like a visitor. She is doing well and recovering from this stroke. She hopes to be mod/I at discharge but does have plans in place for someone to be with her if needed and daughter taking 37 yo son to [place] until end of August. Work on discharge needs.  Lucy Chris 06/10/2021, 10:09 AM

## 2021-06-10 NOTE — Progress Notes (Signed)
Inpatient Rehabilitation  Patient information reviewed and entered into eRehab system by Serenitie Vinton M. Nikeya Maxim, M.A., CCC/SLP, PPS Coordinator.  Information including medical coding, functional ability and quality indicators will be reviewed and updated through discharge.    

## 2021-06-10 NOTE — Progress Notes (Signed)
Patient ID: [name] Costa, female   DOB: [birth year], 40 y.o.   MRN: [id number] Met with the patient to review role of the nurse CM and provide education on medications and secondary risk factors for management of HTN and HLD. Discussed DAPT for 3 months per MD and then ASA solo. Also reviewed low albumin and dietary modifications to help bring up levels. Continue to follow along to discharge to address educational needs and questions to facilitate preparation for discharge. Margarito Liner, RN

## 2021-06-10 NOTE — Progress Notes (Signed)
Occupational Therapy Session Note  Patient Details  Name: [name] Costa MRN: [id number] Date of Birth: [birth year]  Today's Date: 06/10/2021 OT Individual Time: 1941-7408 OT Individual Time Calculation (min): 54 min    Short Term Goals: Week 1:  OT Short Term Goal 1 (Week 1): STGs=LTGs 2/2 ELOS   Skilled Therapeutic Interventions/Progress Updates:    Pt greeted at time of unscheduled OT session agreeable to additional OT session for today. No pain throughout. Focus of session on strengthening per pt request as she feels like her LUE/LLE are "weaker" and this is her main complaint. Ambulated throughout room close supervision for safety but no LOB noted and minimal sway. 2x10-15 of the following: body weight squats for lower body strengthening and proprioceptive input, wall push ups for BUEs and each single UE with tactile cues for form, single leg high knees + BUE pull down with mirror for feedback. Brief rest break in between sets. Discussion of DME as well, daughter planning on buying shower seat for tub/shower unit at home. Ambulating back to bed and sit > supine Supervision. Alarm on call bell in reach.    Therapy Documentation Precautions:  Precautions Precautions: Fall Restrictions Weight Bearing Restrictions: No    Therapy/Group: Individual Therapy  Erasmo Score 06/10/2021, 12:26 PM

## 2021-06-10 NOTE — Progress Notes (Signed)
Physical Therapy Session Note  Patient Details  Name: [name] Costa MRN: [id number] Date of Birth: October 24, 1981  Today's Date: 06/10/2021 PT Individual Time: 1100-1200 PT Individual Time Calculation (min): 60 min   Short Term Goals: Week 1:  PT Short Term Goal 1 (Week 1): LTG=STG  Skilled Therapeutic Interventions/Progress Updates:    Pain:  Pt reports no pain.  Treatment to tolerance.  Rest breaks and repositioning as needed.  Pt initially oob in recliner and agreeable to treatment session w/focus on gait, NMRE. Sit to stand and gait x 31ft to wc w/cga, transported pt to gym   Gait trials:  Gait 120 ft w/cga improved cadence and knee control from Evaluation but L knee wobbles and extension thrust at loading occur w/fatiuge, absent L armswing.  Functional gait 115ft including stepping over 1in obstacles, stepping onto foam and balanceing w/cga, eyes closed w/min assist, weaving around cones cga, turning 180* w/cga, overall decreased cadenc from normal gait.  Wobbles and extension thrust occur w/fatigue  Gait 149ft passing weightedball L to R for dual task challenge,  wobbles/xtenstiont thrust decreased during this activity, pt states "its easier to focus on my knee while I do this".  Seated L hand fine motor task using screw driver to screw in/out screw w/L hand, also manual manipulation of bolt/swrew. Seated L hamstring curls w/green theraband resistance L3 x 15  Quadruped alternating leg raises w/cues for isolation, trunk stabilization, LUE stabilization x 12 Tall kneeling to sitting on haunches repeated x 20  Sidestepping x 75ft w/cga Backing x 1ft w/cga Wc propulsion using bilat Les x 122ft for hamstring strengthening. stand pivot transfer wc to bed w/close supervision. Pt left supine w/rails up x 3, alarm set, bed in lowest position, and needs in reach.    Therapy Documentation Precautions:  Precautions Precautions: Fall Restrictions Weight Bearing Restrictions:  No    Therapy/Group: Individual Therapy Rada Hay, PT   [name] 06/10/2021, 12:15 PM

## 2021-06-10 NOTE — Progress Notes (Signed)
[facility] Individual Statement of Services  Patient Name:  [name] Costa  Date:  06/10/2021  Welcome to the [facility]patient [facility].  Our goal is to provide you with an individualized program based on your diagnosis and situation, designed to meet your specific needs.  With this comprehensive rehabilitation program, you will be expected to participate in at least 3 hours of rehabilitation therapies Monday-Friday, with modified therapy programming on the weekends.  Your rehabilitation program will include the following services:  Physical Therapy (PT), Occupational Therapy (OT), Speech Therapy (ST), 24 hour per day rehabilitation nursing, Care Coordinator, Rehabilitation Medicine, Nutrition Services, and Pharmacy Services  Weekly team conferences will be held on Tuesday to discuss your progress.  Your Inpatient Rehabilitation Care Coordinator will talk with you frequently to get your input and to update you on team discussions.  Team conferences with you and your family in attendance may also be held.  Expected length of stay: 7-10 days  Overall anticipated outcome: independent with device  Depending on your progress and recovery, your program may change. Your Inpatient Rehabilitation Care Coordinator will coordinate services and will keep you informed of any changes. Your Inpatient Rehabilitation Care Coordinator's name and contact numbers are listed  below.  The following services may also be recommended but are not provided by the Inpatient Rehabilitation Center:  Driving Evaluations Home Health Rehabiltiation Services Outpatient Rehabilitation Services Vocational Rehabilitation   Arrangements will be made to provide these services after discharge if needed.  Arrangements include referral to agencies that provide these services.  Your insurance has been verified to be:  BCBS Your primary doctor is:  Npne  Pertinent information will be shared with your  doctor and your insurance company.  Inpatient Rehabilitation Care Coordinator:  Dossie Der, [place] [phone] or Luna Glasgow  Information discussed with and copy given to patient by: Lucy Chris, 06/10/2021, 9:57 AM

## 2021-06-10 NOTE — Progress Notes (Signed)
Occupational Therapy Session Note  Patient Details  Name: [name] Costa MRN: [id number] Date of Birth: [birth year]  Today's Date: 06/10/2021 OT Individual Time: 6811-5726 OT Individual Time Calculation (min): 60 min    Short Term Goals: Week 1:  OT Short Term Goal 1 (Week 1): STGs=LTGs 2/2 ELOS  Skilled Therapeutic Interventions/Progress Updates:    Pt received in bed ready to participate. She stated she felt much stronger today and feel much of the paralysis she had at the time of her stroke had resolved. Pt agreeable to a shower. She first picked out her clothing out of her luggage and then ambulated with LIGHT CGA to toilet and then into shower. Supervision only with all self care.  Pt then worked on dynamic balance and LLE strengthening in standing.  Adjusted bed height and bed rail to simulate a grab bar.  Pt practiced stepping over a simulated tub wall several times with good control of L leg.  Pt will need to practice in tub room.  Pt should use a shower seat and her sister will get one for her. She plans to talk to the landlord about installing a grab bar.  In standing worked on LLE AROM with hamstring curls and hip abduction.  Heel raises alternating toe raises.    Pt participated well and is eager to get better and return home.    Pt resting in recliner with chair pad alarm on and all needs met.   Therapy Documentation Precautions:  Precautions Precautions: Fall Restrictions Weight Bearing Restrictions: No      Pain: Pain Assessment Pain Score: 0-No pain ADL: ADL Eating: Independent Grooming: Independent Where Assessed-Grooming: Chair Upper Body Bathing: Setup Where Assessed-Upper Body Bathing: Shower Lower Body Bathing: Supervision/safety Where Assessed-Lower Body Bathing: Shower Upper Body Dressing: Independent Where Assessed-Upper Body Dressing: Edge of bed Lower Body Dressing: Setup Where Assessed-Lower Body Dressing: Edge of bed Toileting:  Supervision/safety Where Assessed-Toileting: Glass blower/designer: Therapist, music Method: Ambulating (with RW) Science writer: Bedside commode (positioned over toilet) Tub/Shower Transfer: Not assessed Social research officer, government: Curator Method: Ambulating (with RW and grab bars) Youth worker: Shower seat with back, Grab bars  Therapy/Group: Individual Therapy  [place] 06/10/2021, 12:37 PM

## 2021-06-11 MED ORDER — AMLODIPINE BESYLATE 10 MG PO TABS
10.0000 mg | ORAL_TABLET | Freq: Every day | ORAL | Status: DC
  Administered 2021-06-12 – 2021-06-13 (×2): 10 mg via ORAL
  Filled 2021-06-11 (×2): qty 1

## 2021-06-11 NOTE — Progress Notes (Signed)
Physical Therapy Session Note  Patient Details  Name: [name] Costa MRN: [id number] Date of Birth: 1981-11-30  Today's Date: 06/11/2021 PT Individual Time: 1503-1530 PT Individual Time Calculation (min): 27 min   Short Term Goals: Week 1:  PT Short Term Goal 1 (Week 1): LTG=STG  Skilled Therapeutic Interventions/Progress Updates:  Pt received sitting in recliner, no reports of pain. Pt agreeable to PT. Pt walked to bathroom and independently toileted. Pt ambulated from room, down stairs and to ortho gym w/sup. Performed L & R sidestepping w/Sup ~28ft while squatting to pick up cones for LE strengthening and dynamic balance training. Ambulated up stairs back to room w/sup. Pt was left supine in bed with all needs in reach.   Therapy Documentation Precautions:  Precautions Precautions: Fall Restrictions Weight Bearing Restrictions: No  Vital Signs: Therapy Vitals Temp: 98 F (36.7 C) Temp Source: Oral Pulse Rate: 61 Resp: 16 BP:  (no s/s noted, Pt is stable, will continue monitor) Patient Position (if appropriate): Lying Oxygen Therapy SpO2: 100 % O2 Device: Room Air   Therapy/Group: Individual Therapy  Eliazer Hemphill E Huxley Shurley Jill Alexanders Tollie Canada, PT, DPT  06/11/2021, 7:48 AM

## 2021-06-11 NOTE — Progress Notes (Signed)
PROGRESS NOTE   Subjective/Complaints: Doing great!! Had a nice visit with her son on Sunday Discussed with Amil Amen OT plan for d/c Friday Team conference today  ROS: Patient denies fever, rash, sore throat, blurred vision, nausea, vomiting, diarrhea, cough, shortness of breath or chest pain, joint or back pain, headache, or mood change.    Objective:   No results found. Recent Labs    06/10/21 0718  WBC 7.3  HGB 10.8*  HCT 34.2*  PLT 266   Recent Labs    06/09/21 0507 06/10/21 0718  NA 136 137  K 3.5 3.9  CL 103 105  CO2 25 25  GLUCOSE 108* 99  BUN 10 10  CREATININE 0.68 0.67  CALCIUM 9.2 8.9    Intake/Output Summary (Last 24 hours) at 06/11/2021 1301 Last data filed at 06/11/2021 0836 Gross per 24 hour  Intake 480 ml  Output --  Net 480 ml        Physical Exam: Vital Signs Blood pressure (!) 138/98, pulse 75, temperature 98.2 F (36.8 C), resp. rate 16, height 5\' 11"  (1.803 m), weight 93.6 kg, SpO2 100 %, unknown if currently breastfeeding. Gen: no distress, normal appearing HEENT: oral mucosa pink and moist, NCAT Cardio: Reg rate Chest: normal effort, normal rate of breathing Abd: soft, non-distended Ext: no edema Psych: pleasant, normal affect Skin: intact  Neurological:    Mental Status: She is alert.    Comments: Alert and oriented x 3. Normal insight and awareness. Intact Memory. Normal language and speech.   Mild left central 7 and tongue deviation but normal speech.  LUE   4-/5 prox to distal with decreased FTN and + PD. LLE 4/5 prox to distal. DTR's 1+.  sl difference in proprioception LUE. Good sitting balance     Assessment/Plan: 1. Functional deficits which require 3+ hours per day of interdisciplinary therapy in a comprehensive inpatient rehab setting. Physiatrist is providing close team supervision and 24 hour management of active medical problems listed below. Physiatrist and  rehab team continue to assess barriers to discharge/monitor patient progress toward functional and medical goals  Care Tool:  Bathing    Body parts bathed by patient: Right arm, Left arm, Chest, Abdomen, Front perineal area, Buttocks, Right upper leg, Left upper leg, Right lower leg, Left lower leg, Face         Bathing assist Assist Level: Set up assist     Upper Body Dressing/Undressing Upper body dressing   What is the patient wearing?: Pull over shirt    Upper body assist Assist Level: Independent    Lower Body Dressing/Undressing Lower body dressing      What is the patient wearing?: Underwear/pull up, Pants     Lower body assist Assist for lower body dressing: Supervision/Verbal cueing     Toileting Toileting    Toileting assist Assist for toileting: Independent with assistive device     Transfers Chair/bed transfer  Transfers assist     Chair/bed transfer assist level: Supervision/Verbal cueing     Locomotion Ambulation   Ambulation assist      Assist level: Supervision/Verbal cueing Assistive device: No Device Max distance: 160   Walk 10 feet  activity   Assist     Assist level: Supervision/Verbal cueing Assistive device: No Device   Walk 50 feet activity   Assist    Assist level: Supervision/Verbal cueing Assistive device: No Device    Walk 150 feet activity   Assist Walk 150 feet activity did not occur: Safety/medical concerns  Assist level: Supervision/Verbal cueing Assistive device: No Device    Walk 10 feet on uneven surface  activity   Assist     Assist level: Minimal Assistance - Patient > 75% Assistive device: Other (comment) (handrail)   Wheelchair     Assist Will patient use wheelchair at discharge?: No (Per PT long term goals)             Wheelchair 50 feet with 2 turns activity    Assist            Wheelchair 150 feet activity     Assist          Blood pressure (!)  138/98, pulse 75, temperature 98.2 F (36.8 C), resp. rate 16, height 5\' 11"  (1.803 m), weight 93.6 kg, SpO2 100 %, unknown if currently breastfeeding.  Medical Problem List and Plan: 1.   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 SLP d/c'ed her  -grounds pass prn 2.  Impaired mobility, ambulating >200 feet -DVT/anticoagulation: SCDs             -antiplatelet therapy: Continue Aspirin 81 mg daily and Plavix 75 mg daily x3 months then aspirin alone 3. Pain Management: Tylenol as needed 4. Mood: Provide emotional support             -antipsychotic agents: N/A 5. Neuropsych: This patient is capable of making decisions on her own behalf. 6. Skin/Wound Care: Routine skin checks 7. Fluids/Electrolytes/Nutrition: Routine in and outs with follow-up chemistries on admit. Vitamin B complex with vitamin C tablet ordered  albumin low. Otherwise labs look good 8.  Permissive hypertension.  Cleviprex discontinued.  Monitor with increased mobility.    -Increase Norvasc to 10mg  9.  Hyperlipidemia.  Continue Lipitor 10.  Urine drug screen positive marijuana.  Provide counseling as appropriate 11. Hypomagnesemia: start mag 500mg  daily.      LOS: 4 days A FACE TO FACE EVALUATION WAS PERFORMED  Fifi Schindler P Darrian Grzelak 06/11/2021, 1:01 PM

## 2021-06-11 NOTE — Progress Notes (Signed)
Patient ID: [name] Costa, female   DOB: 1981/08/28, 40 y.o.   MRN: [id number] Met with pt to update regarding team conference goals of independent level and discharge 7/14. She is very pleased with her progress and feels ready to go home. Can see her son before he leaves to go to [place] which makes her happy. Agreeable to OPPT will have OP contact her to set up appointment, no equipment needs.

## 2021-06-11 NOTE — Progress Notes (Signed)
Physical Therapy Session Note  Patient Details  Name: [name] Costa MRN: [id number] Date of Birth: [birth year]  Today's Date: 06/11/2021 PT Individual Time: 1003-1058 PT Individual Time Calculation (min): 55 min   Short Term Goals: Week 1:  PT Short Term Goal 1 (Week 1): LTG=STG  Skilled Therapeutic Interventions/Progress Updates:    Pain:  Pt reports no pain.  Treatment to tolerance.  Rest breaks and repositioning as needed.  Pt initially oob in recliner and agreeable to treatment session w/focus on NMRE, dynamic balance, functional gait. Sit to stand and short distance gait to commode, commode transfer all w/supervision.  Independent w/torileting and clothing management as well as washing hands at sink.   Once in wc, pt transported to gym for continued session.   Gait >259ft w/supervision, mild wobbles L knee w/fatigue but no buckling or hyperextension thrust today.  Repeated at end of session adding increased cadence 2ft x 2, no change in stability but decreased clearance thru swing w/fatigue, corrects w/cues.  NMRE, LLE, core strengthening including: Lateral lunges w/progressive increase in pace. Balance loss w/fatigue due to LLE instability, min assist for recovery. Quadruped leg lifts/hip extension progressed to alternating arm raises + leg raises but limited reps due to very challenging from stbilization aspect. Tall kneeling + overhead weighted ball lift Tall kneeling static while performing diagonal trunk rotation w/overhead lift x 15 each direction, 2 sets Prone hamstring curls w/cues to control smooth motion, control eccentric lowering. Prone hip extension w/knee flexion 2 x10  At end of session, pt stand pivot transfer to recliner w/supervision.  Pt left oob in recliner w/chair alarm set and needs in reach.    Therapy Documentation Precautions:  Precautions Precautions: Fall Restrictions Weight Bearing Restrictions: No    Therapy/Group: Individual  Therapy Rada Hay, PT   [name] 06/11/2021, 12:40 PM

## 2021-06-11 NOTE — Patient Care Conference (Signed)
Inpatient RehabilitationTeam Conference and Plan of Care Update Date: 06/11/2021   Time: 13:00 PM    Patient Name: [name] Costa      Medical Record Number: [id number]  Date of Birth: 07/07/1981 Sex: Female         Room/Bed: 5C08C/5C08C-01 Payor Info: Payor: BLUE CROSS BLUE SHIELD / Plan: BCBS COMM PPO / Product Type: *No Product type* /    Admit Date/Time:  06/07/2021  4:45 PM  Primary Diagnosis:  Infarction of right basal ganglia [facility])  Hospital Problems: Principal Problem:   Infarction of right basal ganglia [facility])    Expected Discharge Date: Expected Discharge Date: 06/13/21  Team Members Present: Physician leading conference: Dr. Sula Soda Care Coodinator Present: Chana Bode, RN, BSN, CRRN;Becky Dupree, LCSW Nurse Present: Chana Bode, RN PT Present: Rada Hay, PT OT Present: Primitivo Gauze, OT SLP Present: Colin Benton, SLP     Current Status/Progress Goal Weekly Team Focus  Bowel/Bladder   continent to B/B, last BM 06/09/21  remain continent  Assess Q shift and PRN   Swallow/Nutrition/ Hydration             ADL's   supervision overall, excellent progress with balance and strength  Mod I goals  ADL training, balance, LUE/ LLE strength and coordination, pt education   Mobility             Communication             Safety/Cognition/ Behavioral Observations            Pain   no c/o pain or any discomfort  remain pain free  Assess pain Q shift and PRN   Skin   skin intact  skin remain intact  Assess skin Q shift and PRN     Discharge Planning:  HOme with godson-daughter taking 8 yo to [place] until school starts in August. High level will be short LOS   Team Discussion: Doing well overall. MD to address HTN; adjusting meds and hypo-magnesium and Vitamin D deficiency; supplements added.  Patient on target to meet rehab goals: yes, currently distant supervision overall.  *See Care Plan and progress notes for long and short-term goals.    Revisions to Treatment Plan:  Working on awareness of deficits and high level balance issues  Teaching Needs: Medications, secondary stroke risk management, balance  Current Barriers to Discharge: Decreased caregiver support  Possible Resolutions to Barriers: Family education OP follow up services     Medical Summary Current Status: HTN, overweight, hypomagnesemia, vitamin D deficiency  Barriers to Discharge: Medical stability  Barriers to Discharge Comments: HTN, overweight, hypomagnesemia, vitamin D deficiency Possible Resolutions to [employer] Focus: increase amlodipine to 10mg , provide dietary education, start magnesium gluconate 500mg , start high dose ergocalciferol once per week for 7 weeks   Continued Need for Acute Rehabilitation Level of Care: The patient requires daily medical management by a physician with specialized training in physical medicine and rehabilitation for the following reasons: Direction of a multidisciplinary physical rehabilitation program to maximize functional independence : Yes Medical management of patient stability for increased activity during participation in an intensive rehabilitation regime.: Yes Analysis of laboratory values and/or radiology reports with any subsequent need for medication adjustment and/or medical intervention. : Yes   I attest that I was present, lead the team conference, and concur with the assessment and plan of the team.   B 06/11/2021, 3:04 PM

## 2021-06-11 NOTE — Progress Notes (Signed)
Occupational Therapy Session Note  Patient Details  Name: [name] Costa MRN: [id number] Date of Birth: 05-Mar-1981  Today's Date: 06/11/2021 OT Individual Time: 7078-6754 OT Individual Time Calculation (min): 60 min    Short Term Goals: Week 1:  OT Short Term Goal 1 (Week 1): STGs=LTGs 2/2 ELOS  Skilled Therapeutic Interventions/Progress Updates:    Pt received in bed ready to get up and shower. With close S pt ambulated around room to gather clothing from her luggage, walked to toilet and completed toilet and shower stall transfers with S. Sat in shower and completed shower with set up only.   Continued to be able to ambulate around room to dress and get ready for the day with distant S.  Dynamic balance and LLE strength has improved dramatically.  Pt spent time talking about lifestyle changes she will make to prevent other strokes.  Pt has excellent awareness and problem solving skills.  Pt resting in recliner with all needs met.  Chair alarm set.    Therapy Documentation Precautions:  Precautions Precautions: Fall Restrictions Weight Bearing Restrictions: No    Vital Signs: Therapy Vitals Temp: 98 F (36.7 C) Temp Source: Oral Pulse Rate: 61 Resp: 16 BP:  (no s/s noted, Pt is stable, will continue monitor) Patient Position (if appropriate): Lying Oxygen Therapy SpO2: 100 % O2 Device: Room Air Pain: Pain Assessment Pain Score: 0-No pain ADL: ADL Eating: Independent Grooming: Independent Where Assessed-Grooming: Chair Upper Body Bathing: Setup Where Assessed-Upper Body Bathing: Shower Lower Body Bathing: Supervision/safety Where Assessed-Lower Body Bathing: Shower Upper Body Dressing: Independent Where Assessed-Upper Body Dressing: Edge of bed Lower Body Dressing: Setup Where Assessed-Lower Body Dressing: Edge of bed Toileting: Supervision/safety Where Assessed-Toileting: Glass blower/designer: Therapist, music Method: Ambulating (with  RW) Science writer: Bedside commode (positioned over toilet) Tub/Shower Transfer: Not assessed Social research officer, government: Curator Method: Ambulating (with RW and grab bars) Youth worker: Shower seat with back, Grab bars  Therapy/Group: Individual Therapy  [place] 06/11/2021, 9:21 AM

## 2021-06-11 NOTE — Discharge Summary (Signed)
Physician Discharge Summary  Patient ID: [name] Costa MRN: [id number] DOB/AGE: 07-09-1981 40 y.o.  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 COMPARISON:  Multiple studies from yesterday FINDINGS: Brain: Right sided perforator infarct spanning the basal ganglia and corona radiata that has become readily apparent by CT. The extent is unchanged from prior brain MRI. No hemorrhagic conversion or evidence of new infarction. Vascular: No hyperdense vessel or unexpected calcification. Skull: Ground-glass right parietal bone lesion as previously described. Sinuses/Orbits: Retention cyst in the right maxillary sinus IMPRESSION: Acute perforator infarct at the right basal ganglia matching the prior brain MRI. No hemorrhagic conversion. Electronically Signed   By: Marnee SpringJonathon  Watts M.D.   On: 06/05/2021 04:31   CT HEAD WO CONTRAST  Result Date: 06/04/2021 CLINICAL DATA:  Stroke follow-up.  Change in mental status after tPA EXAM: CT HEAD WITHOUT CONTRAST TECHNIQUE: Contiguous axial images were obtained from the base of the skull through the vertex without intravenous contrast. COMPARISON:  Head CT from earlier today FINDINGS: Brain: No hemorrhage, hydrocephalus, or swelling. Subtle areas of low-density at the right caudate and posterior putamen, areas of restricted diffusion by prior MRI. Vascular: No hyperdense vessel. Skull: Normal. Negative for fracture or focal lesion. Sinuses/Orbits: No acute finding. IMPRESSION: No developments  since prior MRI.  No intracranial hemorrhage. Electronically Signed   By: Marnee SpringJonathon  Watts M.D.   On: 06/04/2021 09:13   MR BRAIN WO CONTRAST  Result Date: 06/04/2021 CLINICAL DATA:  Left facial droop EXAM: MRI HEAD WITHOUT CONTRAST TECHNIQUE: Multiplanar, multiecho pulse sequences of the brain and surrounding structures were obtained without intravenous contrast. COMPARISON:  Head CT and CTA from earlier today FINDINGS: Brain: Truncated study in the emergent setting. There is a wedge of mildly restricted diffusion in the right lateral lenticulostriate distribution spanning the stratum and corona radiata. No associated FLAIR changes. No chronic ischemia, atrophy, or hydrocephalus. 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 IMPRESSION: Mildly restricted diffusion without FLAIR changes with a right lateral lenticulostriate distribution. Electronically Signed   By: Marnee SpringJonathon  Watts M.D.   On: 06/04/2021 06:04   DG Swallowing Func-Speech Pathology  Result Date: 06/05/2021 Formatting of this result is different from the original. Objective Swallowing Evaluation: Type of Study: MBS-Modified Barium Swallow Study  Patient Details Name: [name] Costa MRN: [id number] Date of Birth: 07-09-1981 Today's Date: 06/05/2021 Time: SLP Start Time (ACUTE ONLY): 78290916 -SLP Stop Time (ACUTE ONLY): 0930 SLP Time Calculation (min) (ACUTE ONLY): 14 min Past Medical History: Past Medical History: Diagnosis Date  Bladder infection   Trichomonas  Past Surgical History: Past Surgical History: Procedure Laterality Date  CHOLECYSTECTOMY    CHOLECYSTECTOMY    TUBAL LIGATION Bilateral 04/13/2013  Procedure: POST PARTUM TUBAL LIGATION;  Surgeon: Bing Plumehomas F Henley, MD;  Location: [facility];  Service: Gynecology;  Laterality: Bilateral; HPI: 40 yr old  woke up this am and noted gait disturbance, left sided arm/leg and facial weakness. MRI showed mildly restricted diffusion without FLAIR changes with  a right lateral lenticulostriate distribution. PMH; UTI  No data recorded Assessment / Plan / Recommendation CHL IP CLINICAL IMPRESSIONS 06/05/2021 Clinical Impression Pt demonstrated minimal oropharyngeal dysphagia with adequate protection of airway. Cohesion and control were adequate for thin and nectar liquids and mastication and transit of cracker required increased effort due to CN VII and XII impairments. Onset of multilevel laryngeal protection, ROM was coordinated with sequential straw sips thin. Incomplete pharyngeal clearance after thin remaining in vallecuale for 3 seconds before sensing and clearing with spontaneous sub swallow. Coordination and protection adequate with multi consistency of pill and thin. Pill hesitated at GE junction and entered stomach with additional sip thin. Recommend Dys 3 (pt preferred initate chopped meats), thin, pills with thin, straws allowed, small sips and ST continuation for progression of solids. SLP Visit Diagnosis Dysphagia, pharyngeal phase (R13.13) Attention and concentration deficit following -- Frontal lobe and executive function deficit following -- Impact on safety and function Mild aspiration risk   CHL IP TREATMENT RECOMMENDATION 06/05/2021 Treatment Recommendations Therapy as outlined in treatment plan below   Prognosis 06/05/2021 Prognosis for Safe Diet Advancement Good Barriers to Reach Goals -- Barriers/Prognosis Comment -- CHL IP DIET RECOMMENDATION 06/05/2021 SLP Diet Recommendations Dysphagia 3 (Mech soft) solids;Thin liquid Liquid Administration via Straw;Cup Medication Administration Whole meds with liquid Compensations Slow rate;Small sips/bites Postural Changes Seated upright at 90 degrees   CHL IP OTHER RECOMMENDATIONS 06/05/2021 Recommended Consults -- Oral Care Recommendations Oral care BID Other Recommendations  --   CHL IP FOLLOW UP RECOMMENDATIONS 06/05/2021 Follow up Recommendations Inpatient Rehab   CHL IP FREQUENCY AND DURATION 06/05/2021 Speech Therapy Frequency (ACUTE ONLY) min 2x/week Treatment Duration 2 weeks      CHL IP ORAL PHASE 06/05/2021 Oral Phase Impaired Oral - Pudding Teaspoon -- Oral - Pudding Cup -- Oral - Honey Teaspoon -- Oral - Honey Cup -- Oral - Nectar Teaspoon -- Oral - Nectar Cup WFL Oral - Nectar Straw -- Oral - Thin Teaspoon -- Oral - Thin Cup WFL Oral - Thin Straw WFL Oral - Puree -- Oral - Mech Soft -- Oral - Regular Delayed oral transit Oral - Multi-Consistency -- Oral - Pill -- Oral Phase - Comment --  CHL IP PHARYNGEAL PHASE 06/05/2021 Pharyngeal Phase Impaired Pharyngeal- Pudding Teaspoon -- Pharyngeal -- Pharyngeal- Pudding Cup -- Pharyngeal -- Pharyngeal- Honey Teaspoon -- Pharyngeal -- Pharyngeal- Honey Cup -- Pharyngeal -- Pharyngeal- Nectar Teaspoon -- Pharyngeal -- Pharyngeal- Nectar Cup WFL Pharyngeal -- Pharyngeal- Nectar Straw -- Pharyngeal -- Pharyngeal- Thin Teaspoon -- Pharyngeal -- Pharyngeal- Thin Cup Pharyngeal residue - pyriform Pharyngeal -- Pharyngeal- Thin Straw Pharyngeal residue - pyriform Pharyngeal -- Pharyngeal- Puree -- Pharyngeal -- Pharyngeal- Mechanical Soft -- Pharyngeal -- Pharyngeal- Regular WFL Pharyngeal -- Pharyngeal- Multi-consistency -- Pharyngeal -- Pharyngeal- Pill -- Pharyngeal -- Pharyngeal Comment --  CHL IP CERVICAL ESOPHAGEAL PHASE 06/05/2021 Cervical Esophageal Phase WFL Pudding Teaspoon -- Pudding Cup -- Honey Teaspoon -- Honey Cup -- Nectar Teaspoon -- Nectar Cup -- Nectar Straw -- Thin Teaspoon -- Thin Cup -- Thin Straw -- Puree -- Mechanical Soft -- Regular -- Multi-consistency -- Pill -- Cervical Esophageal Comment -- Royce Macadamia 06/05/2021, 11:52 AM  Breck Coons [employer]ell [employer] administrator Pager [phone] Office [phone]             VAS [place] TRANSCRANIAL DOPPLER W BUBBLES  Result Date: 06/11/2021  Transcranial Doppler  with Bubble Patient Name:  [name] Costa  Date of Exam:   06/05/2021 Medical Rec #:  [id number]         Accession #:    [id number] Date of Birth: 1981-05-21         Patient Gender: F Patient Age:   40Y Exam Location:  [facility] Procedure:      VAS [place] TRANSCRANIAL Demetrios Isaacs Referring Phys: [id number] PRAMOD S SETHI --------------------------------------------------------------------------------  Indications: Stroke. Comparison Study: No prior studies. Performing Technologist: Jean Rosenthal RDMS,RVT  Examination Guidelines: A complete evaluation includes B-mode imaging, spectral Doppler, color Doppler, and power Doppler as needed of all accessible portions of each vessel. Bilateral testing is considered an integral part of a complete examination. Limited examinations for reoccurring indications may be performed as noted.  Summary: No HITS at rest or during Valsalva. Negative transcranial Doppler Bubble study with no evidence of right to left intracardiac communication.  A vascular evaluation was performed. The right middle cerebral artery was studied. An IV was inserted into the patient's left forearm. Verbal informed consent was obtained.  *See table(s) above for TCD measurements and observations.  Diagnosing physician: Delia Heady MD Electronically signed by Delia Heady MD on 06/11/2021 at 8:23:04 AM.    Final    ECHO TEE  Result Date: 06/06/2021    TRANSESOPHOGEAL ECHO REPORT   Patient Name:   [name] Costa Date of Exam: 06/06/2021 Medical Rec #:  [id number]        Height:       67.0 in Accession #:    [id number]       Weight:       224.4 lb Date of Birth:  09-04-1981        BSA:          2.124 m Patient Age:    40 years         BP:           141/77 mmHg Patient Gender: F                HR:           127 bpm. Exam Location:  Inpatient Procedure: 2D Echo Indications:    stroke  History:        Patient has no prior history of Echocardiogram examinations.                 Risk Factors:Current Smoker and  Hypertension.  Sonographer:    Delcie Roch Referring Phys: [id number] CALLIE E GOODRICH PROCEDURE: After discussion of the risks and benefits of a TEE, an informed consent was obtained from the patient. The transesophogeal probe was passed without difficulty through the esophogus of the patient. Imaged were obtained with the patient in a left lateral decubitus position. Local oropharyngeal anesthetic was provided with Cetacaine. Sedation performed by different physician. The patient was monitored while under deep sedation. Anesthestetic sedation was provided intravenously by Anesthesiology:  of Propofol,  of Lidocaine. The patient developed no complications during the procedure. IMPRESSIONS  1. Left ventricular ejection fraction, by estimation, is 60 to 65%. The left ventricle has normal function.  2. Right ventricular systolic function is normal. The right ventricular size is normal.  3. No left atrial/left atrial appendage thrombus was detected.  4. The mitral valve is normal in structure. Trivial mitral valve regurgitation.  5. The aortic valve is tricuspid. Aortic valve regurgitation is not visualized. No aortic stenosis is present.  6. Agitated saline contrast bubble study was positive with shunting observed within 3-6 cardiac cycles suggestive of interatrial shunt. Suggests small patent foramen ovale.  FINDINGS  Left Ventricle: Left ventricular ejection fraction, by estimation, is 60 to 65%. The left ventricle has normal function. The left ventricular internal cavity size was normal in size. Right Ventricle: The right ventricular size is normal. No increase in right ventricular wall thickness. Right ventricular systolic function is normal. Left Atrium: Left atrial size was normal in size. No left atrial/left atrial appendage thrombus was detected. Right Atrium: Right atrial size was normal in size. Pericardium: There is no evidence of pericardial effusion. Mitral Valve: The mitral valve is normal in  structure. Trivial mitral valve regurgitation. Tricuspid Valve: The tricuspid valve is normal in structure. Tricuspid valve regurgitation is trivial. Aortic Valve: The aortic valve is tricuspid. Aortic valve regurgitation is not visualized. No aortic stenosis is present. Pulmonic Valve: The pulmonic valve was grossly normal. Pulmonic valve regurgitation is trivial. Aorta: The aortic root and ascending aorta are structurally normal, with no evidence of dilitation. IAS/Shunts: No atrial level shunt detected by color flow Doppler. Agitated saline contrast was given intravenously to evaluate for intracardiac shunting. Agitated saline contrast bubble study was positive with shunting observed within 3-6 cardiac cycles suggestive of interatrial shunt. A small patent foramen ovale is detected. Epifanio Lesches MD Electronically signed by Epifanio Lesches MD Signature Date/Time: 06/06/2021/3:51:04 PM    Final    US PELVIC COMPLETE WITH TRANSVAGINAL  Result Date: 05/14/2021 CLINICAL DATA:  Excessive bleeding after age 34, history of PCOS, LMP 04/23/2021 EXAM: TRANSABDOMINAL AND TRANSVAGINAL ULTRASOUND OF PELVIS TECHNIQUE: Both transabdominal and transvaginal ultrasound examinations of the pelvis were performed. Transabdominal technique was performed for global imaging of the pelvis including uterus, ovaries, adnexal regions, and pelvic cul-de-sac. It was necessary to proceed with endovaginal exam following the transabdominal exam to visualize the uterus, endometrium, and ovaries. COMPARISON:  None FINDINGS: Uterus Measurements: 5.0 x 5.4 x 5.1 cm = volume: 128 mL. Anteverted. Heterogeneous myometrium. Few areas of myometrial shadowing. Cannot exclude adenomyosis with this appearance. Nabothian cysts at cervix. No discrete mass. Endometrium Thickness: 4 mm. Question bicornuate morphology. No endometrial mass or fluid. Right ovary Measurements: 2.0 x 2.7 x 3.9 cm. = volume: 11 mL. Normal morphology without mass Left  ovary Measurements: 2.5 x 2.6 x 3.6 cm = volume: 12 mL. Normal morphology without mass Other findings No free pelvic fluid.  No adnexal masses. IMPRESSION: Heterogeneous myometrium with scattered areas of myometrial shadowing, cannot exclude adenomyosis with this appearance. Question bicornuate morphology. Remainder of exam unremarkable. Electronically Signed   By: Ulyses Southward M.D.   On: 05/14/2021 09:20   ECHOCARDIOGRAM COMPLETE BUBBLE STUDY  Result Date: 06/04/2021    ECHOCARDIOGRAM REPORT   Patient Name:   [name] Costa Date of Exam: 06/04/2021 Medical Rec #:  [id number]        Height:       67.0 in Accession #:    [id number]       Weight:       224.4 lb Date of Birth:  [birth year]        BSA:          2.124 m Patient Age:    40 years         BP:           145/88 mmHg Patient Gender: F                HR:           90 bpm. Exam Location:  Inpatient Procedure: 2D Echo, Cardiac Doppler, Color Doppler and Saline Contrast  Bubble            Study Indications:    Stroke 434.91 / I63.9  History:        Patient has no prior history of Echocardiogram examinations.  Sonographer:    Renella Cunas RDCS Referring Phys: [id number] [facility] IMPRESSIONS  1. Left ventricular ejection fraction, by estimation, is 65 to 70%. The left ventricle has normal function. The left ventricle has no regional wall motion abnormalities. Left ventricular diastolic parameters were normal.  2. Right ventricular systolic function is normal. The right ventricular size is normal.  3. The mitral valve is normal in structure. No evidence of mitral valve regurgitation. No evidence of mitral stenosis.  4. The aortic valve is tricuspid. Aortic valve regurgitation is not visualized. No aortic stenosis is present.  5. The inferior vena cava is normal in size with greater than 50% respiratory variability, suggesting right atrial pressure of 3 mmHg.  6. Agitated saline contrast bubble study was negative, with no evidence of any interatrial shunt.  Comparison(s): No prior Echocardiogram. Conclusion(s)/Recommendation(s): Otherwise normal echocardiogram, with minor abnormalities described in the report. FINDINGS  Left Ventricle: Left ventricular ejection fraction, by estimation, is 65 to 70%. The left ventricle has normal function. The left ventricle has no regional wall motion abnormalities. The left ventricular internal cavity size was normal in size. There is  no left ventricular hypertrophy. Left ventricular diastolic parameters were normal. Right Ventricle: The right ventricular size is normal. No increase in right ventricular wall thickness. Right ventricular systolic function is normal. Left Atrium: Left atrial size was normal in size. Right Atrium: Right atrial size was normal in size. Pericardium: There is no evidence of pericardial effusion. Mitral Valve: The mitral valve is normal in structure. No evidence of mitral valve regurgitation. No evidence of mitral valve stenosis. Tricuspid Valve: The tricuspid valve is normal in structure. Tricuspid valve regurgitation is not demonstrated. Aortic Valve: The aortic valve is tricuspid. Aortic valve regurgitation is not visualized. No aortic stenosis is present. Pulmonic Valve: The pulmonic valve was normal in structure. Pulmonic valve regurgitation is not visualized. No evidence of pulmonic stenosis. Aorta: The aortic root is normal in size and structure. Venous: The inferior vena cava is normal in size with greater than 50% respiratory variability, suggesting right atrial pressure of 3 mmHg. IAS/Shunts: The atrial septum is grossly normal. Agitated saline contrast was given intravenously to evaluate for intracardiac shunting. Agitated saline contrast bubble study was negative, with no evidence of any interatrial shunt.  LEFT VENTRICLE PLAX 2D LVIDd:         4.80 cm      Diastology LVIDs:         3.30 cm      LV e' medial:    8.16 cm/s LV PW:         0.80 cm      LV E/e' medial:  9.5 LV IVS:        0.80 cm       LV e' lateral:   13.20 cm/s LVOT diam:     1.90 cm      LV E/e' lateral: 5.9 LV SV:         59 LV SV Index:   28 LVOT Area:     2.84 cm  LV Volumes (MOD) LV vol d, MOD A2C: 125.0 ml LV vol d, MOD A4C: 129.0 ml LV vol s, MOD A2C: 46.4 ml LV vol s, MOD A4C: 46.4 ml LV SV MOD A2C:  78.6 ml LV SV MOD A4C:     129.0 ml LV SV MOD BP:      80.1 ml RIGHT VENTRICLE RV S prime:     13.50 cm/s TAPSE (M-mode): 2.4 cm LEFT ATRIUM             Index       RIGHT ATRIUM           Index LA diam:        3.30 cm 1.55 cm/m  RA Area:     13.10 cm LA Vol (A2C):   40.9 ml 19.26 ml/m RA Volume:   31.50 ml  14.83 ml/m LA Vol (A4C):   22.4 ml 10.55 ml/m LA Biplane Vol: 32.6 ml 15.35 ml/m  AORTIC VALVE LVOT Vmax:   97.50 cm/s LVOT Vmean:  70.700 cm/s LVOT VTI:    0.208 m  AORTA Ao Root diam: 2.80 cm MITRAL VALVE MV Area (PHT): 4.04 cm    SHUNTS MV Decel Time: 188 msec    Systemic VTI:  0.21 m MV E velocity: 77.60 cm/s  Systemic Diam: 1.90 cm MV A velocity: 59.20 cm/s MV E/A ratio:  1.31 Riley Lam MD Electronically signed by Riley Lam MD Signature Date/Time: 06/04/2021/11:11:48 AM    Final    CT HEAD CODE STROKE WO CONTRAST  Result Date: 06/04/2021 CLINICAL DATA:  Code stroke.  Stroke follow up. EXAM: CT HEAD WITHOUT CONTRAST TECHNIQUE: Contiguous axial images were obtained from the base of the skull through the vertex without intravenous contrast. COMPARISON:  None. FINDINGS: Brain: No evidence of acute infarction, hemorrhage, hydrocephalus, extra-axial collection or mass lesion/mass effect. Vascular: No hyperdense vessel or unexpected calcification. Skull: Ground-glass and sclerotic bone lesion with expansion affecting the right parietal bone and measuring approximately 6 cm in diameter. Sinuses/Orbits: No acute finding Other: These results were communicated to Dr Derry Lory at 5:33 am on 06/04/2021 by text page via the [facility] messaging system. ASPECTS [facility] Stroke Program Early CT Score) Not scored without  localizing history IMPRESSION: 1. No acute finding. 2. 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 Electronically Signed   By: Marnee Spring M.D.   On: 06/04/2021 05:34   VAS [place] LOWER EXTREMITY VENOUS (DVT)  Result Date: 06/05/2021  Lower Venous DVT Study Patient Name:  [name] Costa  Date of Exam:   06/04/2021 Medical Rec #: [id number]         Accession #:    [id number] Date of Birth: 1981-07-24         Patient Gender: F Patient Age:   42Y Exam Location:  [facility] Procedure:      VAS [place] LOWER EXTREMITY VENOUS (DVT) Referring Phys: [id number] JAMIE H ALDRIDGE --------------------------------------------------------------------------------  Indications: Stroke.  Risk Factors: None identified. Limitations: Poor ultrasound/tissue interface. Comparison Study: No prior studies. Performing Technologist: Chanda Busing RVT  Examination Guidelines: A complete evaluation includes B-mode imaging, spectral Doppler, color Doppler, and power Doppler as needed of all accessible portions of each vessel. Bilateral testing is considered an integral part of a complete examination. Limited examinations for reoccurring indications may be performed as noted. The reflux portion of the exam is performed with the patient in reverse Trendelenburg.  +---------+---------------+---------+-----------+----------+--------------+ RIGHT    CompressibilityPhasicitySpontaneityPropertiesThrombus Aging +---------+---------------+---------+-----------+----------+--------------+ CFV      Full           Yes      Yes                                 +---------+---------------+---------+-----------+----------+--------------+  SFJ      Full                                                        +---------+---------------+---------+-----------+----------+--------------+ FV Prox  Full                                                         +---------+---------------+---------+-----------+----------+--------------+ FV Mid   Full           Yes      Yes                                 +---------+---------------+---------+-----------+----------+--------------+ FV DistalFull                                                        +---------+---------------+---------+-----------+----------+--------------+ PFV      Full                                                        +---------+---------------+---------+-----------+----------+--------------+ POP      Full           Yes      Yes                                 +---------+---------------+---------+-----------+----------+--------------+ PTV      Full                                                        +---------+---------------+---------+-----------+----------+--------------+ PERO     Full                                                        +---------+---------------+---------+-----------+----------+--------------+   +---------+---------------+---------+-----------+----------+--------------+ LEFT     CompressibilityPhasicitySpontaneityPropertiesThrombus Aging +---------+---------------+---------+-----------+----------+--------------+ CFV      Full           Yes      Yes                                 +---------+---------------+---------+-----------+----------+--------------+ SFJ      Full                                                        +---------+---------------+---------+-----------+----------+--------------+  FV Prox  Full                                                        +---------+---------------+---------+-----------+----------+--------------+ FV Mid   Full                                                        +---------+---------------+---------+-----------+----------+--------------+ FV DistalFull                                                         +---------+---------------+---------+-----------+----------+--------------+ PFV      Full                                                        +---------+---------------+---------+-----------+----------+--------------+ POP      Full           Yes      Yes                                 +---------+---------------+---------+-----------+----------+--------------+ PTV      Full                                                        +---------+---------------+---------+-----------+----------+--------------+ PERO     Full                                                        +---------+---------------+---------+-----------+----------+--------------+     Summary: RIGHT: - There is no evidence of deep vein thrombosis in the lower extremity.  - No cystic structure found in the popliteal fossa.  LEFT: - There is no evidence of deep vein thrombosis in the lower extremity.  - No cystic structure found in the popliteal fossa.  *See table(s) above for measurements and observations. Electronically signed by Heath Lark on 06/05/2021 at 5:18:30 PM.    Final    CT ANGIO HEAD CODE STROKE  Result Date: 06/04/2021 CLINICAL DATA:  Left-sided weakness EXAM: CT ANGIOGRAPHY HEAD AND NECK TECHNIQUE: Multidetector CT imaging of the head and neck was performed using the standard protocol during bolus administration of intravenous contrast. Multiplanar CT image reconstructions and MIPs were obtained to evaluate the vascular anatomy. Carotid stenosis measurements (when applicable) are obtained utilizing NASCET criteria, using the distal internal carotid diameter as the denominator. CONTRAST:  12mL OMNIPAQUE IOHEXOL 350 MG/ML SOLN COMPARISON:  Head CT from earlier today FINDINGS: CTA NECK FINDINGS Aortic arch: Normal.  Three vessel branching. Right carotid system: Vessels are smooth and widely patent. Left carotid system: Vessels are smooth and widely patent. No atheromatous changes. Vertebral arteries: No  proximal subclavian stenosis. Limited assessment of the vertebral arteries due to intravenous contrast reflux on the left more than right. Skeleton: Expansile sclerotic right parietal bone lesion, most consistent with fibrous dysplasia. Scoliosis. Other neck: No acute or aggressive finding in the neck soft tissues. Partial bilateral maxillary sinus opacification Upper chest: Negative Review of the MIP images confirms the above findings CTA HEAD FINDINGS Anterior circulation: Vessels are smooth and widely patent. No branch occlusion, beading, or aneurysm. Posterior circulation: Vessels are smooth and widely patent. No branch occlusion, beading, or aneurysm. Venous sinuses: Unremarkable in the arterial phase Anatomic variants: Hypoplastic left A1 segment and azygos A2 segment. Review of the MIP images confirms the above findings IMPRESSION: No emergent finding.  No stenosis or atheromatous changes. Electronically Signed   By: Marnee Spring M.D.   On: 06/04/2021 06:02   CT ANGIO NECK CODE STROKE  Result Date: 06/04/2021 CLINICAL DATA:  Left-sided weakness EXAM: CT ANGIOGRAPHY HEAD AND NECK TECHNIQUE: Multidetector CT imaging of the head and neck was performed using the standard protocol during bolus administration of intravenous contrast. Multiplanar CT image reconstructions and MIPs were obtained to evaluate the vascular anatomy. Carotid stenosis measurements (when applicable) are obtained utilizing NASCET criteria, using the distal internal carotid diameter as the denominator. CONTRAST:  14mL OMNIPAQUE IOHEXOL 350 MG/ML SOLN COMPARISON:  Head CT from earlier today FINDINGS: CTA NECK FINDINGS Aortic arch: Normal.  Three vessel branching. Right carotid system: Vessels are smooth and widely patent. Left carotid system: Vessels are smooth and widely patent. No atheromatous changes. Vertebral arteries: No proximal subclavian stenosis. Limited assessment of the vertebral arteries due to intravenous contrast reflux on  the left more than right. Skeleton: Expansile sclerotic right parietal bone lesion, most consistent with fibrous dysplasia. Scoliosis. Other neck: No acute or aggressive finding in the neck soft tissues. Partial bilateral maxillary sinus opacification Upper chest: Negative Review of the MIP images confirms the above findings CTA HEAD FINDINGS Anterior circulation: Vessels are smooth and widely patent. No branch occlusion, beading, or aneurysm. Posterior circulation: Vessels are smooth and widely patent. No branch occlusion, beading, or aneurysm. Venous sinuses: Unremarkable in the arterial phase Anatomic variants: Hypoplastic left A1 segment and azygos A2 segment. Review of the MIP images confirms the above findings IMPRESSION: No emergent finding.  No stenosis or atheromatous changes. Electronically Signed   By: Marnee Spring M.D.   On: 06/04/2021 06:02    Labs:  Basic Metabolic Panel: Recent Labs  Lab 06/07/21 0448 06/09/21 0507 06/10/21 0718  NA 135 136 137  K 3.7 3.5 3.9  CL 104 103 105  CO2 22 25 25   GLUCOSE 91 108* 99  BUN 16 10 10   CREATININE 0.77 0.68 0.67  CALCIUM 9.2 9.2 8.9  MG  --  1.7  --     CBC: Recent Labs  Lab 06/07/21 0448 06/10/21 0718  WBC 10.3 7.3  NEUTROABS  --  3.9  HGB 11.6* 10.8*  HCT 36.8 34.2*  MCV 78.1* 78.4*  PLT 294 266    CBG: No results for input(s): GLUCAP in the last 168 hours.  Family history.  Positive for hypertension as well as hyperlipidemia.  Denies any colon cancer esophageal cancer or rectal cancer  Brief HPI:   [name] Costa is a 40 y.o. right-handed female with unremarkable past medical history no  prescription medications.  She lives with her children independent prior to admission.  Presented 06/04/2021 with acute onset of left-sided weakness and facial droop.  Elevated systolic blood pressure in the 200s.  Cranial CT scan negative.  CT angiogram of head and neck no emergent findings or stenosis.  Patient did receive tPA.  MRI  showed mildly restricted diffusion without flair changes with a right lateral lenticulostriate distribution.  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  No hemorrhagic conversion.  Admission chemistries unremarkable.  Urine drug screen was positive for marijuana.  Echocardiogram with ejection fraction of 65 to 70% no wall motion abnormalities.  TEE completed 06/06/2021 showing no thrombus or vegetation.  Patient had initially been placed on Cleviprex for blood pressure control and monitored.  Placed on aspirin and Plavix x3 months for CVA prophylaxis and aspirin alone.  Maintained on a mechanical soft diet.  Therapy evaluations completed due to patient's left-sided weakness was admitted for a comprehensive rehab program.   Hospital Course: [name] Costa was admitted to rehab 06/07/2021 for inpatient therapies to consist of PT, ST and OT at least three hours five days a week. Past admission physiatrist, therapy team and rehab RN have worked together to provide customized collaborative inpatient rehab.  Pertaining to patient's right basal ganglia infarction remained stable she continued on aspirin and Plavix x3 months total then aspirin alone and follow-up with neurology services.  Permissive hypertension monitored closely Norvasc was initiated and she would need outpatient follow-up.  Lipitor for hyperlipidemia.  Urine drug screen was positive for marijuana which patient received counsel regarding cessation of illicit drug products..  Mild hypomagnesemia with magnesium added.   Blood pressures were monitored on TID basis and controlled     Rehab course: During patient's stay in rehab weekly team conferences were held to monitor patient's progress, set goals and discuss barriers to discharge. At admission, patient required minimum to moderate assist 40 feet rolling walker minimal assist sit to stand min guard supine to sit.  Minimal assist upper body  dressing minimal assist upper body bathing moderate assist lower body bathing moderate assist lower body dressing  Physical exam.  Blood pressure 147/98 pulse 66 temperature 97.6 respirations 21 oxygen saturations 90% room air Constitutional.  No acute distress HEENT Head.  Normocephalic and atraumatic Eyes.  Pupils round and reactive to light no discharge without nystagmus Neck.  Supple nontender no JVD without thyromegaly Cardiac regular rate rhythm without extra sounds or murmur heard Abdomen.  Soft nontender positive bowel sounds without rebound Respiratory effort normal no respiratory distress without wheeze Skin.  Intact Musculoskeletal.  Normal range of motion Neurologic.  Alert oriented mildly dysarthric fully intelligible.  She follows commands.  Cognition appeared intact.  Mild left central seventh and tongue deviation.  No sensory abnormalities.  Left upper extremity 3+ to 4 -/5 proximal to distal with decreased FTN.  Left lower extremity 4/5 proximal to distal.  DTRs 1+  He/She  has had improvement in activity tolerance, balance, postural control as well as ability to compensate for deficits. He/She has had improvement in functional use RUE/LUE  and RLE/LLE as well as improvement in awareness.  Ambulates 200 feet supervision gather his belongings for activities daily when homemaking independent with toileting and clothing management as well as washing hands at the sink.  Full family teaching completed was advised no driving she was discharged home after family teaching completed       Disposition: Discharged  to home    Diet: Regular  Special Instructions: No driving smoking or alcohol  Plan aspirin 81 mg daily and Plavix 25 mg daily x3 months then aspirin alone   Medications at discharge. 1.  Tylenol as needed 2.  Amlodipine 10 mg p.o. daily 3.  Aspirin 81 mg p.o. daily 4.  Lipitor 40 mg p.o. daily 5.  B complex with vitamin C 1 tablet daily 6.  Plavix 75 mg p.o.  daily 7.  Magnesium gluconate 500 mg p.o. daily 8.  Vitamin D 50,000 units p.o. every Saturday  30-35 minutes were spent completing discharge summary and discharge planning  Discharge Instructions     Ambulatory referral to Neurology   Complete by: As directed    An appointment is requested in approximately: 4 weeks right basal ganglia infarction   Ambulatory referral to Physical Medicine Rehab   Complete by: As directed    Moderate complexity follow-up 1 to 2 weeks right basal ganglia infarction        Follow-up Information     Raulkar, Drema Pry, MD Follow up.   Specialty: Physical Medicine and Rehabilitation Why: Office to call for appointment Contact information: [address] [place] [address] [phone]                 Signed: Charlton Amor 06/12/2021, 5:51 AM

## 2021-06-11 NOTE — Progress Notes (Signed)
Occupational Therapy Session Note  Patient Details  Name: [name] Costa MRN: [id number] Date of Birth: [birth year]  Today's Date: 06/11/2021 OT Individual Time: 1136-1202 OT Individual Time Calculation (min): 26 min    Short Term Goals: Week 1:  OT Short Term Goal 1 (Week 1): STGs=LTGs 2/2 ELOS  Skilled Therapeutic Interventions/Progress Updates:    Pt received seated in recliner, agreeable to OT, reporting no pain. Pt reported earlier OT/PT sessions suggesting session focus on practicing tub shower transfers and simple meal prep in kitchen for safety at home for d/c. Sit<>stand and functional ambulation recliner<>w/c close spvsn with no LOB. Pt wheeled to apartment for time. Tub shower transfer w/c<>TTB at ambulatory level close spvsn following demonstration. Pt also practiced stepping over tub wall laterally to sit on shower seat in tub following demo with close spvsn. Pt reported feeling comfortable with these methods for home. Pt education on fall prevention, home safety/set up, DME recommendations/safety, and energy conservation strategies. Pt engaged in simple meal prep and kitchen safety task at ambulatory level reaching to high and low surfaces with no UE supported, demoing appropriate balance when reaching outside BOS and no LOB. Pt returned to room and remained seated in recliner, call bell in reach, alarm set, all immediate needs met.  Therapy Documentation Precautions:  Precautions Precautions: Fall Restrictions Weight Bearing Restrictions: No   Pain: Pain Assessment Pain Scale: 0-10 Pain Score: 0-No pain    Therapy/Group: Individual Therapy  Mellissa Kohut 06/11/2021, 7:30 AM

## 2021-06-11 NOTE — Progress Notes (Signed)
Physical Therapy Session Note  Patient Details  Name: [name] Costa MRN: [id number] Date of Birth: 04-06-1981  Today's Date: 06/11/2021 PT Individual Time: 5945-8592 PT Individual Time Calculation (min): 26 min   Short Term Goals: Week 1:  PT Short Term Goal 1 (Week 1): LTG=STG  Skilled Therapeutic Interventions/Progress Updates:     Pt received seated in recliner and agrees to therapy. No complaint of pain. Sit to stand with close supervision for safety. Pt ambulates from room on 5th floor to main gym, including descending x20 steps with R hand rail and cues for step sequencing. PT provides CGA and cues for upright gaze to improve posture and balance, and increasing stride length on R to promote increased weight shifting and stance time on the L. Gait x300' total. In gym, pt performs "speedball" with trampoline, tossing and catching rebound with R leg propped on 6 inch step and L leg with slight flexion in knee to promote optimal muscular engagement. Pt completes 3x20 with CGA. Pt then performs same activity but with trampoline placed to the L of patient to encourage L rotation and increased L sided WB, 2x20. Finally pt performs standing trunk rotations holding ontol largest exercise ball to promote core engagement and bilateral upper extremity task. Pt ambulates back to room with CGA, including x20 steps. Left with alarm intact and all needs within reach.  Therapy Documentation Precautions:  Precautions Precautions: Fall Restrictions Weight Bearing Restrictions: No   Therapy/Group: Individual Therapy  Beau Fanny, PT, DPT 06/11/2021, 4:11 PM

## 2021-06-12 MED ORDER — VITAMIN D (ERGOCALCIFEROL) 1.25 MG (50000 UNIT) PO CAPS: 50000.0000 [IU] | ORAL_CAPSULE | ORAL | 0 refills | Status: DC

## 2021-06-12 MED ORDER — ATORVASTATIN CALCIUM 40 MG PO TABS: 40.0000 mg | ORAL_TABLET | Freq: Every day | ORAL | 0 refills | Status: DC

## 2021-06-12 MED ORDER — B COMPLEX-C PO TABS: 1.0000 | ORAL_TABLET | Freq: Every day | ORAL | 0 refills | Status: DC

## 2021-06-12 MED ORDER — AMLODIPINE BESYLATE 10 MG PO TABS: 10.0000 mg | ORAL_TABLET | Freq: Every day | ORAL | 0 refills | Status: DC

## 2021-06-12 MED ORDER — CLOPIDOGREL BISULFATE 75 MG PO TABS: 75.0000 mg | ORAL_TABLET | Freq: Every day | ORAL | 0 refills | Status: DC

## 2021-06-12 MED ORDER — MAGNESIUM GLUCONATE 500 MG PO TABS: 500.0000 mg | ORAL_TABLET | Freq: Every day | ORAL | 0 refills | Status: DC

## 2021-06-12 NOTE — Progress Notes (Signed)
Occupational Therapy Session Note  Patient Details  Name: [name] Costa MRN: [id number] Date of Birth: 07/29/1981  Today's Date: 06/12/2021 OT Individual Time: 1031-5945 OT Individual Time Calculation (min): 45 min    Short Term Goals: Week 1:  OT Short Term Goal 1 (Week 1): STGs=LTGs 2/2 ELOS  Skilled Therapeutic Interventions/Progress Updates:    Pt received in room and consented to OT tx. Pt walked with SUP from room, down stair well, and down to therapy gym. Session focused on BUE strengthening HEP to increase strength and activity tolerance for ADLs. Pt c/o decreased strength in LUE, only mild deficits noted. Instructed in 3#db exercises including bicep flexion, wrist flexion and extension, wrist pro/supination, and 2#db shoulder flexion, shoulder abduction, and shoulder press, all for 3x15 with min cuing for proper technique with good carryover. After tx, pt left in therapy gym awaiting PT tx with all needs met.    Therapy Documentation Precautions:  Precautions Precautions: Fall Restrictions Weight Bearing Restrictions: No  Vital Signs: Therapy Vitals Temp: 98 F (36.7 C) Temp Source: Oral Pulse Rate: 62 Resp: 16 BP: (!) 158/87 Patient Position (if appropriate): Lying Oxygen Therapy SpO2: 98 % O2 Device: Room Air Pain:   none    Therapy/Group: Individual Therapy  Macarius Ruark 06/12/2021, 7:42 AM

## 2021-06-12 NOTE — Progress Notes (Signed)
Inpatient Rehabilitation Care Coordinator Discharge Note  The overall goal for the admission was met for:   Discharge location: [place]  Length of Stay: Yes-6 DAYS  Discharge activity level: Yes-INDEPENDENT LEVEL  Home/community participation: Yes  Services provided included: MD, RD, PT, OT, SLP, RN, CM, Pharmacy, and SW  Financial Services: Private Insurance: [place] offered to/list presented to:PT  Follow-up services arranged: Outpatient: [facility]-PT WILL CALL PT TO SET UP APPOINTMENT  TO GET TUB SEAT ON OWN  Comments (or additional information):PT DID WELL AND REACHED GOALS QUICKLY, SHE IS VERY PLEASED WITH PROGRESS. DAUGH[facility] T[facility] 6 YO [facility] STARTS FOR PT TO RECOVER  Patient/Family verbalized understanding of follow-up arrangements: Yes  Individual responsible for coordination of the follow-up plan: PATIENT [phone]  Confirmed correct DME delivered: Elease Hashimoto 06/12/2021    Taziyah Iannuzzi, [place]

## 2021-06-12 NOTE — Progress Notes (Signed)
Physical Therapy Session Note  Patient Details  Name: [name] Costa MRN: [id number] Date of Birth: November 26, 1981  Today's Date: 06/12/2021 PT Individual Time: 1502-1526 PT Individual Time Calculation (min): 24 min   Short Term Goals: Week 1:  PT Short Term Goal 1 (Week 1): LTG=STG  Skilled Therapeutic Interventions/Progress Updates:  Pt received sitting in recliner in room. No reports of pain and agreeable to PT. Emphasis of session on gait, endurance and confidence with stairs. Pt ambulated independently from room to gift shop on 1st floor using stairwell. Descended 4 flights of stairs independently using step-through pattern and RUE support on rail independently. Walked around gift shop and returned to room on 5th floor via stairs. Ascended 4 flights of stairs using step-through pattern and RUE support on rail. Pt was left in room with all needs in reach.   Therapy Documentation Precautions:  Precautions Precautions: Fall Restrictions Weight Bearing Restrictions: No  Vital Signs: Therapy Vitals Temp: 98 F (36.7 C) Temp Source: Oral Pulse Rate: 62 Resp: 16 BP: (!) 158/87 Patient Position (if appropriate): Lying Oxygen Therapy SpO2: 98 % O2 Device: Room Air  Therapy/Group: Individual Therapy  Kortlyn Koltz E Mehki Klumpp Jill Alexanders Riley Papin, PT, DPT  06/12/2021, 7:48 AM

## 2021-06-12 NOTE — Progress Notes (Signed)
PROGRESS NOTE   Subjective/Complaints: No new issues. Pleased with progress. Excited about going home tomorrow!  ROS: Patient denies fever, rash, sore throat, blurred vision, nausea, vomiting, diarrhea, cough, shortness of breath or chest pain, joint or back pain, headache, or mood change.   Objective:   No results found. Recent Labs    06/10/21 0718  WBC 7.3  HGB 10.8*  HCT 34.2*  PLT 266   Recent Labs    06/10/21 0718  NA 137  K 3.9  CL 105  CO2 25  GLUCOSE 99  BUN 10  CREATININE 0.67  CALCIUM 8.9    Intake/Output Summary (Last 24 hours) at 06/12/2021 9629 Last data filed at 06/11/2021 1822 Gross per 24 hour  Intake 480 ml  Output --  Net 480 ml        Physical Exam: Vital Signs Blood pressure (!) 158/87, pulse 62, temperature 98 F (36.7 C), temperature source Oral, resp. rate 16, height 5\' 11"  (1.803 m), weight 95.6 kg, SpO2 98 %, unknown if currently breastfeeding. Constitutional: No distress . Vital signs reviewed. HEENT: EOMI, oral membranes moist Neck: supple Cardiovascular: RRR without murmur. No JVD    Respiratory/Chest: CTA Bilaterally without wheezes or rales. Normal effort    GI/Abdomen: BS +, non-tender, non-distended Ext: no clubbing, cyanosis, or edema Psych: pleasant and cooperative  Skin: intact  Neurological:    Mental Status: She is alert.    Comments: Alert and oriented x 3. Normal insight and awareness. Intact Memory. Normal language and speech.   Mild left central 7 and tongue deviation but normal speech.  LUE   4-/5 prox to distal with decreased FTN and + PD. LLE 4/5 prox to distal. DTR's 1+.  sl difference in proprioception LUE but improving     Assessment/Plan: 1. Functional deficits which require 3+ hours per day of interdisciplinary therapy in a comprehensive inpatient rehab setting. Physiatrist is providing close team supervision and 24 hour management of active medical  problems listed below. Physiatrist and rehab team continue to assess barriers to discharge/monitor patient progress toward functional and medical goals  Care Tool:  Bathing    Body parts bathed by patient: Right arm, Left arm, Chest, Abdomen, Front perineal area, Buttocks, Right upper leg, Left upper leg, Right lower leg, Left lower leg, Face         Bathing assist Assist Level: Set up assist     Upper Body Dressing/Undressing Upper body dressing   What is the patient wearing?: Pull over shirt    Upper body assist Assist Level: Independent    Lower Body Dressing/Undressing Lower body dressing      What is the patient wearing?: Underwear/pull up, Pants     Lower body assist Assist for lower body dressing: Supervision/Verbal cueing     Toileting Toileting    Toileting assist Assist for toileting: Independent with assistive device     Transfers Chair/bed transfer  Transfers assist     Chair/bed transfer assist level: Supervision/Verbal cueing     Locomotion Ambulation   Ambulation assist      Assist level: Supervision/Verbal cueing Assistive device: No Device Max distance: >249ft   Walk 10 feet activity  Assist     Assist level: Supervision/Verbal cueing Assistive device: No Device   Walk 50 feet activity   Assist    Assist level: Supervision/Verbal cueing Assistive device: No Device    Walk 150 feet activity   Assist Walk 150 feet activity did not occur: Safety/medical concerns  Assist level: Supervision/Verbal cueing Assistive device: No Device    Walk 10 feet on uneven surface  activity   Assist     Assist level: Minimal Assistance - Patient > 75% Assistive device: Other (comment) (handrail)   Wheelchair     Assist Will patient use wheelchair at discharge?: No (Per PT long term goals)             Wheelchair 50 feet with 2 turns activity    Assist            Wheelchair 150 feet activity      Assist          Blood pressure (!) 158/87, pulse 62, temperature 98 F (36.7 C), temperature source Oral, resp. rate 16, height 5\' 11"  (1.803 m), weight 95.6 kg, SpO2 98 %, unknown if currently breastfeeding.  Medical Problem List and Plan: 1.   Left side weakness secondary to infarction of the right basal ganglia             -patient may shower             -ELOS/Goals: 06/13/21,   mod I with PT, OT, SLP  -Continue CIR therapies including PT, OT.    -grounds pass prn 2.  Impaired mobility, ambulating >200 feet -DVT/anticoagulation: SCDs             -antiplatelet therapy: Continue Aspirin 81 mg daily and Plavix 75 mg daily x3 months then aspirin alone 3. Pain Management: Tylenol as needed 4. Mood: Provide emotional support             -antipsychotic agents: N/A 5. Neuropsych: This patient is capable of making decisions on her own behalf. 6. Skin/Wound Care: Routine skin checks 7. Fluids/Electrolytes/Nutrition: Routine in and outs with follow-up chemistries on admit. Vitamin B complex with vitamin C tablet ordered  albumin low. Otherwise labs look good 8.  Permissive hypertension.  Cleviprex discontinued.  Monitor with increased mobility.    -Increased Norvasc to 10mg  effective today 7/13---observe  9.  Hyperlipidemia.  Continue Lipitor 10.  Urine drug screen positive marijuana.  Provide counseling as appropriate 11. Hypomagnesemia: started mag 500mg  daily.      LOS: 5 days A FACE TO FACE EVALUATION WAS PERFORMED  06/12/2021, 9:07 AM

## 2021-06-12 NOTE — Progress Notes (Signed)
Occupational Therapy Discharge Summary  Patient Details  Name: [name] Costa MRN: [id number] Date of Birth: 1981-11-22   Patient has met 10 of 10 long term goals due to improved activity tolerance, improved balance, postural control, functional use of  LEFT upper and LEFT lower extremity, and improved coordination.  Patient to discharge at overall Independent level.  Patient's care partner is independent to provide the necessary physical assistance at discharge, such as complex IADLs and driving.   Reasons goals not met: n/a  Recommendation:  No further OT services needed at this time.  Equipment: No equipment provided - pt will get a shower chair on her own  Reasons for discharge: treatment goals met  Patient/family agrees with progress made and goals achieved: Yes  OT Discharge Precautions/Restrictions   No driving at this time ADL ADL Eating: Independent Grooming: Independent Where Assessed-Grooming: Chair Upper Body Bathing: Modified independent Where Assessed-Upper Body Bathing: Shower Lower Body Bathing: Modified independent Where Assessed-Lower Body Bathing: Shower Upper Body Dressing: Independent Where Assessed-Upper Body Dressing: Edge of bed Lower Body Dressing: Independent Where Assessed-Lower Body Dressing: Edge of bed Toileting: Independent Where Assessed-Toileting: Glass blower/designer: Programmer, applications Method: IT consultant: Modified independent Clinical cytogeneticist Method: Optometrist: Civil engineer, contracting without back Transport planner Method: Heritage manager: Civil engineer, contracting with back Vision Baseline Vision/History: No visual deficits Patient Visual Report: No change from baseline Vision Assessment?: No apparent visual deficits Perception  Perception: Within Functional Limits Praxis Praxis: Intact Cognition Overall Cognitive Status:  Within Functional Limits for tasks assessed Sensation Sensation Light Touch: Appears Intact Hot/Cold: Appears Intact Proprioception: Appears Intact Stereognosis: Appears Intact Coordination Gross Motor Movements are Fluid and Coordinated: Yes Fine Motor Movements are Fluid and Coordinated: Yes Motor  Motor Motor - Discharge Observations: mild LLE weakness,  LUE function has improved to [facility] Mobility  Bed Mobility Rolling Left: Independent Right Sidelying to Sit: Independent Sitting - Scoot to Edge of Bed: Independent Sit to Supine: Independent Transfers Sit to Stand: Independent Stand to Sit: Independent  Trunk/Postural Assessment  Cervical Assessment Cervical Assessment: Within Functional Limits Thoracic Assessment Thoracic Assessment: Within Functional Limits Lumbar Assessment Lumbar Assessment: Within Functional Limits Postural Control Postural Control: Within Functional Limits  Balance Static Sitting Balance Static Sitting - Level of Assistance: 7: Independent Dynamic Sitting Balance Sitting balance - Comments: independent Static Standing Balance Static Standing - Level of Assistance: 7: Independent Dynamic standing - independent Extremity/Trunk Assessment RUE Assessment RUE Assessment: Within Functional Limits LUE Assessment LUE Assessment: Within Functional Limits (slight weakness and pt has a sensation of heaviness, but able to use functionally)   [place] 06/12/2021, 7:55 AM

## 2021-06-12 NOTE — Discharge Summary (Signed)
Physical Therapy Discharge Summary  Patient Details  Name: [name] Costa MRN: [id number] Date of Birth: [birth year]  Today's Date: 06/12/2021 PT Individual Time: 1030-1126 PT Individual Time Calculation (min): 56 min   Patient has met 9 of 9 long term goals due to improved activity tolerance, improved balance, improved postural control, increased strength, ability to compensate for deficits, improved attention, improved awareness, and improved coordination.  Patient to discharge at an ambulatory level Independent.   Patient's care partner is independent to provide the necessary physical and cognitive assistance at discharge.  Reasons goals not met: n/a  Recommendation:  Patient will benefit from ongoing skilled PT services in outpatient setting to continue to advance safe functional mobility, address ongoing impairments in dynamic balance, L neuromuscular re-ed, gait training, and fall prevention in order to minimize fall risk.  Equipment: No equipment provided  Reasons for discharge: treatment goals met and discharge from hospital  Patient/family agrees with progress made and goals achieved: Yes  PT Discharge Precautions/Restrictions Precautions Precautions: None Restrictions Weight Bearing Restrictions: No Pain Pain Assessment Pain Score: 0-No pain Vision/Perception  Perception Perception: Within Functional Limits Praxis Praxis: Intact  Cognition Overall Cognitive Status: Within Functional Limits for tasks assessed Arousal/Alertness: Awake/alert Orientation Level: Oriented X4 Attention: Focused;Sustained Focused Attention: Appears intact Sustained Attention: Appears intact Memory: Appears intact Safety/Judgment: Appears intact Sensation Sensation Light Touch: Appears Intact Hot/Cold: Appears Intact Proprioception: Appears Intact Stereognosis: Appears Intact Coordination Gross Motor Movements are Fluid and Coordinated: Yes Fine Motor Movements are Fluid and  Coordinated: Yes Motor  Motor Motor: Within Functional Limits Motor - Discharge Observations: mild LLE weakness,  LUE function has improved to [facility]  Mobility Bed Mobility Rolling Right: Independent Rolling Left: Independent Right Sidelying to Sit: Independent Supine to Sit: Independent Sitting - Scoot to Edge of Bed: Independent Sit to Supine: Independent Transfers Transfers: Sit to Stand;Stand to [employer] Transfers Sit to Stand: Independent Stand to Sit: Independent Stand Pivot Transfers: Independent Transfer (Assistive device): None Locomotion  Gait Ambulation: Yes Gait Assistance: Independent Gait Distance (Feet): 1000 Feet Assistive device: None Gait Gait: Yes Gait Pattern: Within Functional Limits Gait Pattern: Decreased stance time - left;Left flexed knee in stance Gait velocity: reduced Stairs / Additional Locomotion Stairs: Yes Stairs Assistance: Independent Stair Management Technique: One rail Right Number of Stairs: 14 Height of Stairs: 6 Ramp: Independent Wheelchair Mobility Wheelchair Mobility: No  Trunk/Postural Assessment  Cervical Assessment Cervical Assessment: Within Functional Limits Thoracic Assessment Thoracic Assessment: Within Functional Limits Lumbar Assessment Lumbar Assessment: Within Functional Limits Postural Control Postural Control: Within Functional Limits  Balance Balance Balance Assessed: Yes Standardized Balance Assessment Standardized Balance Assessment: Berg Balance Test Berg Balance Test Sit to Stand: Able to stand without using hands and stabilize independently Standing Unsupported: Able to stand safely 2 minutes Sitting with Back Unsupported but Feet Supported on Floor or Stool: Able to sit safely and securely 2 minutes Stand to Sit: Sits safely with minimal use of hands Transfers: Able to transfer safely, minor use of hands Standing Unsupported with Eyes Closed: Able to stand 10 seconds safely Standing  Ubsupported with Feet Together: Able to place feet together independently and stand 1 minute safely From Standing, Reach Forward with Outstretched Arm: Can reach confidently >25 cm (10") From Standing Position, Pick up Object from Floor: Able to pick up shoe safely and easily From Standing Position, Turn to Look Behind Over each Shoulder: Looks behind from both sides and weight shifts well Turn 360 Degrees: Able to turn 360 degrees safely  in 4 seconds or less Standing Unsupported, Alternately Place Feet on Step/Stool: Able to stand independently and safely and complete 8 steps in 20 seconds Standing Unsupported, One Foot in Front: Able to place foot tandem independently and hold 30 seconds Standing on One Leg: Able to lift leg independently and hold > 10 seconds Total Score: 56 Static Sitting Balance Static Sitting - Level of Assistance: 7: Independent Dynamic Sitting Balance Sitting balance - Comments: independent Static Standing Balance Static Standing - Level of Assistance: 7: Independent Extremity Assessment  RUE Assessment RUE Assessment: Within Functional Limits LUE Assessment LUE Assessment: Within Functional Limits General Strength Comments: slight weakness and pt has a sensation of heaviness, but able to use functionally RLE Assessment RLE Assessment: Within Functional Limits LLE Assessment LLE Assessment: Within Functional Limits General Strength Comments: hip flex 4/5, knee ext 5/5, ankle DF 4+/5   Skilled Intervention: Pt received sitting in w/c in rehab gym after completing her OT session. She's agreeable to PT tx without reports of pain. Focus of session to review home safety training, DC planning, etc. Pt reports confidence in returning home but has questions regarding planned upcoming events (weddings) - discussed importance of self care, safety awareness, knowing limitations, and minimizing over exertion. Ambulated ~258f from main rehab gym to ortho gym indep with no AD  - navigated inclined ramp indep without AD as well. Ambulated from 5th floor rehab gym downstairs (via elevator) to outside near reflection fountain, >505f indep with no AD. While outdoors, she maintained indep level and had appropriate safety awareness while navigating unlevel concrete surfaces. Practiced navigating outdoor steps with indep and 1 hand rail on R, walking across streets (via crosswalks), and up moderately steep hills. She then practiced ambulating in busy hallways hospital wings, navigating up/down 14 steps indep with 1 hand rail, and ambulated well over >100054fn total. Completed the BERG balance test with results listed above, scored 56/56, indicating reduced falls risk. She then completed 7 minutes of Nustep with workload of 7 (progressing to 8 and minute 5), using BLE's only, working on strengthening and cardiovascular endurance. She reports a 13/20 on RPE scale after completing. She then ambulated back upstairs to her room (via elevator) with no AD, indep. She remained standing in her room at end of session with all needs met, all questions concerns addressed, and pt appreciative of care.    Nekoda Chock P Isa Hitz 06/12/2021, 11:18 AM

## 2021-06-12 NOTE — Progress Notes (Signed)
Occupational Therapy Session Note  Patient Details  Name: [name] Costa MRN: [id number] Date of Birth: 11/10/1981  Today's Date: 06/12/2021 OT Individual Time: 0803-0900 OT Individual Time Calculation (min): 57 min    Short Term Goals: Week 1:  OT Short Term Goal 1 (Week 1): STGs=LTGs 2/2 ELOS  Skilled Therapeutic Interventions/Progress Updates:    Pt received in room and ready to for therapy. Pt ambulated around her room, showered, toileted, dressed all independently. She does use a shower chair to be able to sit for most of the shower.    Discussed her now being independent in the room and to call for assistance if her status should change such as not feeling well, light headed, etc.    Discussed with pt that she needs outpt PT to address LLE weakness but a few simple home exercises done daily will help her gain more strength as she stated her arm feels heavy to her and weaker than the R.  Functionally LUE is WNL.  Pt worked on several BUE AROM exercises in standing based on Pilates that she can easily do at home, standing "dancing" arm exercises she can do to music with her son, and reviewed good functional activities such as cleaning windows and dusting.  Pt feels prepared for d/c home tomorrow, all goals met.    Therapy Documentation Precautions:  Precautions Precautions: Fall Restrictions Weight Bearing Restrictions: No    Vital Signs: Therapy Vitals Temp: 98 F (36.7 C) Temp Source: Oral Pulse Rate: 62 Resp: 16 BP: (!) 158/87 Patient Position (if appropriate): Lying Oxygen Therapy SpO2: 98 % O2 Device: Room Air Pain: Pain Assessment Pain Score: 0-No pain ADL: ADL Eating: Independent Grooming: Independent Where Assessed-Grooming: Chair Upper Body Bathing: Modified independent Where Assessed-Upper Body Bathing: Shower Lower Body Bathing: Modified independent Where Assessed-Lower Body Bathing: Shower Upper Body Dressing: Independent Where Assessed-Upper  Body Dressing: Edge of bed Lower Body Dressing: Independent Where Assessed-Lower Body Dressing: Edge of bed Toileting: Independent Where Assessed-Toileting: Glass blower/designer: Programmer, applications Method: Counselling psychologist: Other (comment) (none) Tub/Shower Transfer: Modified independent Clinical cytogeneticist Method: Optometrist: Civil engineer, contracting without back Social research officer, government: Chief Financial Officer Method: Heritage manager: Shower seat with back   Therapy/Group: Individual Therapy  [name] 06/12/2021, 8:16 AM

## 2021-06-13 NOTE — Progress Notes (Signed)
Patient discharged this shift. Voices understanding of discharge instruction.

## 2021-06-13 NOTE — Progress Notes (Signed)
Slept well throughout the night. Denies any issues. BP-135/103 reported, pt asymptomatic. BP manual recheck-142/88.

## 2021-06-13 NOTE — Progress Notes (Signed)
Patient ID: [name] Costa, female   DOB: September 06, 1981, 40 y.o.   MRN: [id number] Follow up with the patient pending discharge. Patient feels like she is ready to go; awaiting MD discharge. Reported FMLA paperwork at home; family to bring in for signing. Denies questions or concerns about discharge and plan for care. Pamelia Hoit, RN

## 2021-06-14 ENCOUNTER — Telehealth: Payer: Self-pay

## 2021-06-14 NOTE — Telephone Encounter (Signed)
Transitional Care call--who you talked with pt itself     Are you/is patient experiencing any problems since coming home? Are there any questions regarding any aspect of care? No questions pt states shes doing pretty good since she is back home . Are there any questions regarding medications administration/dosing? Are meds being taken as prescribed? Patient should review meds with caller to confirm No went over meds pt is taking evrything as directed. Have there been any falls? No  Has Home Health been to the house and/or have they contacted you? If not, have you tried to contact them? Can we help you contact them? They have not contacted pt . Are bowels and bladder emptying properly? Are there any unexpected incontinence Bowels and Bladder are normal Any fevers, problems with breathing, unexpected pain? no Are there any skin problems or new areas of breakdown?no Has the patient/family member arranged specialty MD follow up (ie cardiology/neurology/renal/surgical/etc)?  Can we help arrange? Pt states shes arranging her future appts . Setting up one today with PCP. Does the patient need any other services or support that we can help arrange? NO Are caregivers following through as expected in assisting the patient? Pt is doing pretty good herself. Has the patient quit smoking, drinking alcohol, or using drugs as recommended? N/a  Appointment time 11:00am , arrive time 10:40am and who it is with here Dr.Raulkar  [address]

## 2021-06-14 NOTE — Telephone Encounter (Signed)
[name] Costa called to retreive the FMLA paperwork she gave you at the hospital. She does not need it filled out. But would like the forms back.    Call back phone [phone].

## 2021-07-04 ENCOUNTER — Other Ambulatory Visit: Payer: Self-pay

## 2021-07-04 ENCOUNTER — Ambulatory Visit: Payer: BC Managed Care – PPO | Attending: Physician Assistant | Admitting: Occupational Therapy

## 2021-07-04 ENCOUNTER — Encounter: Payer: Self-pay | Admitting: Physical Therapy

## 2021-07-04 ENCOUNTER — Encounter: Payer: Self-pay | Admitting: Occupational Therapy

## 2021-07-04 ENCOUNTER — Ambulatory Visit: Payer: BC Managed Care – PPO | Admitting: Physical Therapy

## 2021-07-04 DIAGNOSIS — R278 Other lack of coordination: Secondary | ICD-10-CM | POA: Diagnosis present

## 2021-07-04 DIAGNOSIS — M6281 Muscle weakness (generalized): Secondary | ICD-10-CM | POA: Insufficient documentation

## 2021-07-04 DIAGNOSIS — I69852 Hemiplegia and hemiparesis following other cerebrovascular disease affecting left dominant side: Secondary | ICD-10-CM | POA: Insufficient documentation

## 2021-07-04 DIAGNOSIS — R2681 Unsteadiness on feet: Secondary | ICD-10-CM | POA: Diagnosis present

## 2021-07-04 DIAGNOSIS — R41842 Visuospatial deficit: Secondary | ICD-10-CM | POA: Diagnosis present

## 2021-07-04 DIAGNOSIS — R2689 Other abnormalities of gait and mobility: Secondary | ICD-10-CM | POA: Insufficient documentation

## 2021-07-04 NOTE — Therapy (Signed)
[facility] [address] [place], [place], [address] Phone: [phone]   Fax:  [phone]  Occupational Therapy Evaluation  Patient Details  Name: [name] Costa MRN: [id number] Date of Birth: 08-05-1981 Referring Provider (OT): F/U Dr. Leeroy Cha   Encounter Date: 07/04/2021   OT End of Session - 07/04/21 1054     Visit Number 1    Number of Visits 56   my d/c early based on progress   Date for OT Re-Evaluation 08/29/21    Authorization Type BCBS    Authorization Time Period 100% OOP met VL:30 OT    Authorization - Number of Visits 30    OT Start Time 1017    OT Stop Time 1100    OT Time Calculation (min) 43 min    Activity Tolerance Patient tolerated treatment well    Behavior During Therapy [facility] for tasks assessed/performed             Past Medical History:  Diagnosis Date   Bladder infection    Trichomonas     Past Surgical History:  Procedure Laterality Date   BUBBLE STUDY  06/06/2021   Procedure: BUBBLE STUDY;  Surgeon: Donato Heinz, MD;  Location: [facility];  Service: Cardiovascular;;   CHOLECYSTECTOMY     CHOLECYSTECTOMY     TEE WITHOUT CARDIOVERSION N/A 06/06/2021   Procedure: TRANSESOPHAGEAL ECHOCARDIOGRAM (TEE);  Surgeon: Donato Heinz, MD;  Location: [facility];  Service: Cardiovascular;  Laterality: N/A;   TUBAL LIGATION Bilateral 04/13/2013   Procedure: POST PARTUM TUBAL LIGATION;  Surgeon: Melina Schools, MD;  Location: [facility];  Service: Gynecology;  Laterality: Bilateral;    There were no vitals filed for this visit.   Subjective Assessment - 07/04/21 1053     Subjective  Pt is a 40 year old female that presents to Neuro OPOT s/p R CVA on 06/04/2021. Pt reports primary concern for LUE weakness and report UE is weaker than LE. Pt reports feelings of "heaviness" in LUE at times and new onset of blurry vision with distance but sometimes resolves. Pt reports goal  to "get back/close to 100% as possible". Pt was independent with ADLs and IADLs prior to CVA. Pt works as a Freight forwarder for [name] and lives with her 65 year old son in an apt with a 1 step threshold.    Pertinent History Anxiety    Limitations Fall Risk    Patient Stated Goals "get back/close to 100% as possible"    Currently in Pain? No/denies   pt reports soreness at end of day              [facility] OT Assessment - 07/04/21 1014       Assessment   Medical Diagnosis R CVA    Referring Provider (OT) F/U Dr. 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, etc    Leisure enjoys time with son      ADL   Eating/Feeding Needs assist with cutting food    Grooming Modified independent   increased time   Upper Body Bathing Modified independent    Lower Body Bathing Modified independent    Upper Body Dressing Increased time;Independent    Lower Body Dressing Modified independent    Toilet Transfer Modified independent    Toileting - Clothing Manipulation Modified independent    Toileting -  Hygiene Modified Independent    Tub/Shower Transfer Modified independent      IADL   Prior Level of Function Shopping independent    Shopping Takes care of all shopping needs independently    Prior Level of Function Light Housekeeping independent    Light Housekeeping Maintains house alone or with occasional assistance   does it but takes  longer and more effort   Prior Level of Function Meal Prep independent    Meal Prep Plans, prepares and serves adequate meals independently    Prior Level of Function Community Mobility was driving prior    Programmer, applications own vehicle   not using LUE to drive   Medication Management Has difficulty remembering to take medication   forgets to take the nighttime one   Financial Management Manages day-to-day purchases, but needs help with banking, major purchases, etc.   in need of a paycheck at this time     Written Expression   Dominant Hand Left   fatigue sets in after x1 sentence   Handwriting 25% legible   of what it was prior - reports curve/round letters challenging     Vision - History   Baseline Vision No visual deficits   20/20 vision prior to CVA   Additional Comments pt reports blurriness for far vision since CVA      Cognition   Overall Cognitive Status Impaired/Different from baseline    Area of Impairment Memory    Memory Decreased short-term memory    Memory Comments pt reports repeating conversations etc and having to write things down      Observation/Other Assessments   Focus on Therapeutic Outcomes (FOTO)  61%   predicted 73% at discharge     Sensation   Light Touch Impaired Detail    Light Touch Impaired Details Impaired LUE   pt reports "heaviness" in LUE   Hot/Cold Appears Intact      Coordination   9 Hole Peg Test Right;Left    Right 9 Hole Peg Test 19.21s    Left 9 Hole Peg Test 26.44s      ROM / Strength   AROM / PROM / Strength AROM;Strength      AROM   Overall AROM  Within functional limits for tasks performed      Strength   Overall Strength Within functional limits for tasks performed    Overall Strength Comments proximal strength grossly 5/5 bilaterally      Hand Function   Right Hand Gross Grasp Functional    Right Hand Grip (lbs) 70.3 lbs    Left Hand Gross Grasp Functional    Left Hand Grip (lbs) 28.6 lbs                                OT Short Term Goals - 07/04/21 1104  OT SHORT TERM GOAL #1   Title Pt will be independent with HEP    Baseline grip strength, coordination needed LUE    Time 4    Period Weeks    Status New    Target Date 08/01/21      OT SHORT TERM GOAL #2   Title Pt will increase grip strength in LUE to 35 lbs or greater to increase strength and functional use of LUE, dominant hand.    Baseline L 28.6, R 70.3    Time 4    Period Weeks    Status New      OT SHORT TERM GOAL #3   Title Pt will verbalize understanding of memory compensatory strategies    Time 4    Period Weeks    Status New      OT SHORT TERM GOAL #4   Title Pt will increase typing speed to 20 wpm net speed or greater for progressing towards proficiency with typing for return to work    Baseline 11 wpm net speed    Time 4    Period Weeks    Status New      OT SHORT TERM GOAL #5   Title Pt will legibily write 2 sentences with 75% of prior level of handwriting    Baseline 25% prior level    Time 4    Period Weeks    Status New      OT SHORT TERM GOAL #6   Title Pt will increase coordination in LUE by completing 9 hole peg test in 22 seconds or less.    Baseline R 19.21s, L 26.44s    Time 4    Period Weeks    Status New               OT Long Term Goals - 07/04/21 1103       OT LONG TERM GOAL #1   Title Pt will complete discharge FOTO with score of 73% or greater.    Baseline 61% at eval    Time 8    Period Weeks    Status New    Target Date 08/29/21      OT LONG TERM GOAL #2   Title Pt will be independent with updated HEPs    Time 8    Period Weeks    Status New      OT LONG TERM GOAL #3   Title Pt will increase grip strength in LUE to 40 lbs or greater for increasing functional use of dominant hand.    Baseline R 70.3, L 28.6    Time 8    Period Weeks    Status New      OT LONG TERM GOAL #4   Title Pt will demonstrate ability to manage a crate of 20  lbs or greater and picking up from floor to counter height with BUE and proper body mechanics for progressing towards return to work    Time 8    Period Weeks    Status New      OT LONG TERM GOAL #5   Title Pt will improve typing to 40 wpm net speed for return to work    Time 8    Period Weeks    Status New                   Plan - 07/04/21 1049     Clinical Impression Statement Pt is a 40 year old female  that presents to Neuro OPOT s/p R CVA on 06/04/21. Pt does not report any significant PMH. Pt presents with LUE weakness and decreased grip strength, dereased coordination in LUE, memory deficits and some reports of decreased visual ability. Skilled occupational therapy is recommended to target listed areas of deficit and increase independence with ADLs and IADLs and increase ability to return to PLOF.    OT Occupational Profile and History Problem Focused Assessment - Including review of records relating to presenting problem    Occupational performance deficits (Please refer to evaluation for details): IADL's;ADL's;Work    Body Structure / Function / Physical Skills ADL;Decreased knowledge of use of DME;Strength;Dexterity;UE functional use;IADL;Vision;Sensation;Flexibility;FMC;Coordination    Cognitive Skills Memory    Rehab Potential Good    Clinical Decision Making Limited treatment options, no task modification necessary    Comorbidities Affecting Occupational Performance: None    Modification or Assistance to Complete Evaluation  No modification of tasks or assist necessary to complete eval    OT Frequency 2x / week    OT Duration 8 weeks   may finish prior depending on progress   OT Treatment/Interventions Self-care/ADL training;Fluidtherapy;DME and/or AE instruction;Therapeutic activities;Therapeutic exercise;Cognitive remediation/compensation;Visual/perceptual remediation/compensation;Functional Mobility Training;Neuromuscular education;Energy conservation;Patient/family  education    Plan coordination HEP, Theraputty for LUE HEP    Consulted and Agree with Plan of Care Patient             Patient will benefit from skilled therapeutic intervention in order to improve the following deficits and impairments:   Body Structure / Function / Physical Skills: ADL, Decreased knowledge of use of DME, Strength, Dexterity, UE functional use, IADL, Vision, Sensation, Flexibility, FMC, Coordination Cognitive Skills: Memory     Visit Diagnosis: 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 [place], [place], [address] Phone: [phone]   Fax:  [phone]  Name: Reshanda L Tatem MRN: [id number] Date of Birth: 30-Sep-1981

## 2021-07-04 NOTE — Therapy (Signed)
[facility] [address] [place], [place], [address] Phone: [phone]   Fax:  [phone]  Physical Therapy Evaluation  Patient Details  Name: [name] Costa MRN: [id number] Date of Birth: Feb 06, 1981 Referring Provider (PT): Angiulli, [place] (to follow up with Raulkar)   Encounter Date: 07/04/2021   PT End of Session - 07/04/21 1833     Visit Number 1    Number of Visits 9    Date for PT Re-Evaluation 08/02/21    Authorization Type BCBS; VL for PT:  30    Authorization - Number of Visits 30    PT Start Time 0935    PT Stop Time 1017    PT Time Calculation (min) 42 min    Activity Tolerance Patient tolerated treatment well    Behavior During Therapy [facility] for tasks assessed/performed             Past Medical History:  Diagnosis Date   Bladder infection    Trichomonas     Past Surgical History:  Procedure Laterality Date   BUBBLE STUDY  06/06/2021   Procedure: BUBBLE STUDY;  Surgeon: [name], MD;  Location: [facility];  Service: Cardiovascular;;   CHOLECYSTECTOMY     CHOLECYSTECTOMY     TEE WITHOUT CARDIOVERSION N/A 06/06/2021   Procedure: TRANSESOPHAGEAL ECHOCARDIOGRAM (TEE);  Surgeon: [name], MD;  Location: [facility];  Service: Cardiovascular;  Laterality: N/A;   TUBAL LIGATION Bilateral 04/13/2013   Procedure: POST PARTUM TUBAL LIGATION;  Surgeon: Bing Plume, MD;  Location: [facility];  Service: Gynecology;  Laterality: Bilateral;    There were no vitals filed for this visit.    Subjective Assessment - 07/04/21 0939     Subjective "Noticing that vision is blurring with far objects.  Comfortable with walking.  My biggest issue is being tired.  My arm is an issue more so than my leg.  Constant numbness with fingers.  No losses of balance, no falls.  Have been doing my exercises from the hospital.  Not really sure that I need PT."  Pt does report she is concerned about  stamina and return to work activities.    Patient Stated Goals No specific goals.  At end of session, pt is agreeable to working on high level balance and gait endurance for return to work activities.    Currently in Pain? Yes    Pain Score 3     Pain Location Leg    Pain Orientation Right;Left    Pain Descriptors / Indicators Sore    Pain Type Acute pain    Pain Onset 1 to 4 weeks ago    Pain Frequency Intermittent    Aggravating Factors  worse later in the day    Pain Relieving Factors rest    Effect of Pain on Daily Activities PT will monitor, not address as a goal at this time.                [facility] PT Assessment - 07/04/21 0944       Assessment   Medical Diagnosis CVA    Referring Provider (PT) Angiulli, PA (to follow up with Raulkar)    Onset Date/Surgical Date 06/04/21    Hand Dominance Left    Prior Therapy CIR 06/07/21-06/13/21      Precautions   Precautions Fall      Balance Screen   Has the patient fallen in the past 6 months No    Has the  patient had a decrease in activity level because of a fear of falling?  No    Is the patient reluctant to leave their home because of a fear of falling?  No      Home Environment   Living Environment Private residence    Living Arrangements Children   8 yo   Available Help at Discharge Family    Type of Home Apartment    Home Access Other (comment)   1 step to enter   Home Layout One level    Home Equipment None      Prior Function   Level of Independence Independent    Vocation Full time employment    Scientist, product/process development; requires a lot of movement-walking, stairs; she is concerned as she still fatigues easily    Leisure Enjoys time with son      Observation/Other Assessments   Focus on Therapeutic Outcomes (FOTO)  76% (predicted is 83%      Sensation   Light Touch Appears Intact   to BLEs     Coordination   Gross Motor Movements are Fluid and Coordinated Yes      ROM /  Strength   AROM / PROM / Strength AROM;Strength      AROM   Overall AROM  Within functional limits for tasks performed      Strength   Overall Strength Deficits    Strength Assessment Site Hip;Knee;Ankle    Right/Left Hip Right;Left    Right Hip Flexion 5/5    Left Hip Flexion 4/5    Right/Left Knee Right;Left    Right Knee Flexion 5/5    Right Knee Extension 5/5    Left Knee Flexion 5/5    Left Knee Extension 5/5    Right/Left Ankle Right;Left    Right Ankle Dorsiflexion 5/5    Left Ankle Dorsiflexion 5/5      Transfers   Transfers Sit to Stand;Stand to Sit    Sit to Stand 6: Modified independent (Device/Increase time)    Comments Attempted 5x sit<>stand, but pt c/o lightheadedness and blurred vision with repeated sit to stand at rep 3.  Assessed BP and it is WNL.  Pt reports blurred vision at times is new-requested she talk to MD.      Ambulation/Gait   Ambulation/Gait Yes    Ambulation/Gait Assistance 6: Modified independent (Device/Increase time)    Ambulation Distance (Feet) 1300 Feet    Assistive device None    Gait Pattern Step-through pattern    Ambulation Surface Level;Indoor    Gait velocity 9.47 sec = 3.46 ft/sec      6 Minute Walk- Baseline   6 Minute Walk- Baseline yes    BP (mmHg) 127/79    HR (bpm) 62    02 Sat (%RA) 100 %      6 Minute walk- Post Test   6 Minute Walk Post Test yes    BP (mmHg) 129/80    HR (bpm) 69    Modified Borg Scale for Dyspnea 0.5- Very, very slight shortness of breath      6 minute walk test results    Aerobic Endurance Distance Walked 1300    Endurance additional comments Age related norms      Functional Gait  Assessment   Gait assessed  Yes    Gait Level Surface Walks 20 ft in less than 5.5 sec, no assistive devices, good speed, no evidence for imbalance, normal gait pattern, deviates no more than  6 in outside of the 12 in walkway width.   5.47   Change in Gait Speed Able to smoothly change walking speed without loss of  balance or gait deviation. Deviate no more than 6 in outside of the 12 in walkway width.    Gait with Horizontal Head Turns Performs head turns smoothly with slight change in gait velocity (eg, minor disruption to smooth gait path), deviates 6-10 in outside 12 in walkway width, or uses an assistive device.   6.91   Gait with Vertical Head Turns Performs head turns with no change in gait. Deviates no more than 6 in outside 12 in walkway width.   6.13   Gait and Pivot Turn Pivot turns safely within 3 sec and stops quickly with no loss of balance.    Step Over Obstacle Is able to step over one shoe box (4.5 in total height) but must slow down and adjust steps to clear box safely. May require verbal cueing.    Gait with Narrow Base of Support Is able to ambulate for 10 steps heel to toe with no staggering.    Gait with Eyes Closed Walks 20 ft, uses assistive device, slower speed, mild gait deviations, deviates 6-10 in outside 12 in walkway width. Ambulates 20 ft in less than 9 sec but greater than 7 sec.   8.44   Ambulating Backwards Walks 20 ft, no assistive devices, good speed, no evidence for imbalance, normal gait    Steps Alternating feet, no rail.    Total Score 26                        Objective measurements completed on examination: See above findings.               PT Education - 07/04/21 1832     Education Details Eval results, POC    Person(s) Educated Patient    Methods Explanation    Comprehension Verbalized understanding                 PT Long Term Goals - 07/04/21 1843       PT LONG TERM GOAL #1   Title Pt will be independent with HEP for improved strength, balance, transfers, and gait.  TARGET 08/02/2021    Time 4    Period Weeks    Status New      PT LONG TERM GOAL #2   Title Pt will improve FGA score to at least 28/30 to decrease fall risk.    Time 4    Period Weeks    Status New      PT LONG TERM GOAL #3   Title Pt will increased  gait distance in to at least 1500 ft for improved gait efficiency and endurance for longer distance gait.    Baseline 1300 ft at eval    Time 4    Period Weeks    Status New      PT LONG TERM GOAL #4   Title FOTO will improve to predicted score of 83% at discharge    Baseline 76%    Time 4    Period Weeks    Status New                    Plan - 07/04/21 1835     Clinical Impression Statement Pt is a 40 year old female who presents to OPPT with history of R basal ganglia CVA on  06/04/2021.  She was admitted to hospital, then to inpatient rehab 7/8-7/14/22.  She presents with LLE weakness (more c/o LUE weakness and sensation changes), decreased high level balance (FGA score 26/30), decreased gait endurance and efficiency for long distances (6 MWT 1300 ft, decreased compared to healthy older adult females 1765 ft normal).  Prior to CVA, pt was independent and worked as a Production designer, theatre/television/filmmanager for [employer], which includes walking long distances and on varied surfaces.  She would benefit from skilled PT to address the above stated deficits to decrease fall risk and improve overall functional mobility and return to independence with gait and daily activities.    Personal Factors and Comorbidities Behavior Pattern   positive marijuana in urine screen at admission   Examination-Activity Limitations Locomotion Level    Examination-Participation Restrictions Community Activity;Occupation    Stability/Clinical Decision Making Stable/Uncomplicated    Clinical Decision Making Low    Rehab Potential Good    PT Frequency 2x / week    PT Duration 4 weeks   plus eval; only scheduled 3 weeks, as pt may d/c early due to progres   PT Treatment/Interventions ADLs/Self Care Home Management;Gait training;Functional mobility training;Therapeutic activities;Therapeutic exercise;Balance training;Neuromuscular re-education;Patient/family education    PT Next Visit Plan Initiate HEP for high level balance  exercises; walking program for home; assess vestibular system with solid/foam surface eyes open/eyes closed    Consulted and Agree with Plan of Care Patient             Patient will benefit from skilled therapeutic intervention in order to improve the following deficits and impairments:  Abnormal gait, Decreased endurance, Decreased balance, Decreased strength, Decreased mobility  Visit Diagnosis: Unsteadiness on feet  Other abnormalities of gait and mobility  Muscle weakness (generalized)     Problem List Patient Active Problem List   Diagnosis Date Noted   Infarction of right basal ganglia (HCC) 06/07/2021   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/04/2021   TOBACCO ABUSE 10/15/2009   ALLERGIC RHINITIS 10/15/2009   ASTHMA 10/15/2009    Verneda Hollopeter W. 07/04/2021, 6:48 PM Gean MaidensMARRIOTT,Sylvanus Telford W., PT    [facility] [address] [place], [place], [address] Phone: [phone]   Fax:  [phone]  Name: [name] Costa MRN: [id number] Date of Birth: 1980/12/22

## 2021-07-15 ENCOUNTER — Other Ambulatory Visit: Payer: Self-pay

## 2021-07-15 ENCOUNTER — Other Ambulatory Visit: Payer: Self-pay | Admitting: *Deleted

## 2021-07-15 ENCOUNTER — Ambulatory Visit: Payer: BC Managed Care – PPO | Admitting: Occupational Therapy

## 2021-07-15 ENCOUNTER — Encounter: Payer: Self-pay | Admitting: Physical Therapy

## 2021-07-15 ENCOUNTER — Encounter: Payer: Self-pay | Admitting: Occupational Therapy

## 2021-07-15 ENCOUNTER — Ambulatory Visit: Payer: BC Managed Care – PPO | Admitting: Physical Therapy

## 2021-07-15 VITALS — BP 120/80

## 2021-07-15 DIAGNOSIS — R278 Other lack of coordination: Secondary | ICD-10-CM

## 2021-07-15 DIAGNOSIS — I69852 Hemiplegia and hemiparesis following other cerebrovascular disease affecting left dominant side: Secondary | ICD-10-CM | POA: Diagnosis not present

## 2021-07-15 DIAGNOSIS — R2681 Unsteadiness on feet: Secondary | ICD-10-CM

## 2021-07-15 DIAGNOSIS — M6281 Muscle weakness (generalized): Secondary | ICD-10-CM

## 2021-07-15 DIAGNOSIS — R2689 Other abnormalities of gait and mobility: Secondary | ICD-10-CM

## 2021-07-15 MED ORDER — AMLODIPINE BESYLATE 10 MG PO TABS: 10.0000 mg | ORAL_TABLET | Freq: Every day | ORAL | 0 refills | Status: DC

## 2021-07-15 NOTE — Therapy (Signed)
[facility] [address] [place], [place], [address] Phone: [phone]   Fax:  [phone]  Occupational Therapy Treatment  Patient Details  Name: [name] Costa MRN: [id number] Date of Birth: 11-16-1981 Referring Provider (OT): F/U Dr. Leeroy Cha   Encounter Date: 07/15/2021   OT End of Session - 07/15/21 1026     Visit Number 2    Number of Visits 31   my d/c early based on progress   Date for OT Re-Evaluation 08/29/21    Authorization Type BCBS    Authorization Time Period 100% OOP met VL:30 OT    Authorization - Number of Visits 30    OT Start Time 1023    OT Stop Time 1101    OT Time Calculation (min) 38 min    Activity Tolerance Patient tolerated treatment well    Behavior During Therapy WFL for tasks assessed/performed             Past Medical History:  Diagnosis Date   Bladder infection    Trichomonas     Past Surgical History:  Procedure Laterality Date   BUBBLE STUDY  06/06/2021   Procedure: BUBBLE STUDY;  Surgeon: Donato Heinz, MD;  Location: [facility];  Service: Cardiovascular;;   CHOLECYSTECTOMY     CHOLECYSTECTOMY     TEE WITHOUT CARDIOVERSION N/A 06/06/2021   Procedure: TRANSESOPHAGEAL ECHOCARDIOGRAM (TEE);  Surgeon: Donato Heinz, MD;  Location: [facility];  Service: Cardiovascular;  Laterality: N/A;   TUBAL LIGATION Bilateral 04/13/2013   Procedure: POST PARTUM TUBAL LIGATION;  Surgeon: Melina Schools, MD;  Location: [facility];  Service: Gynecology;  Laterality: Bilateral;    There were no vitals filed for this visit.   Subjective Assessment - 07/15/21 1025     Subjective  My left arm is weaker.  Pt reports difficulty with typing and writing    Pertinent History Anxiety    Limitations Fall Risk    Patient Stated Goals "get back/close to 100% as possible"    Currently in Pain? No/denies              Trial of writing with regular pen and one with  built-up grip.  Pt reports no significant difference.  Pt demo good legibility, but needs incr time.  Pt reports incr difficulty with round letters and demo some difficulty with isolated IP flex.  (But incorporated in HEP)        OT Education - 07/15/21 1032     Education Details Coordination, Tendon glides, and Red Putty HEP--see pt instructions    Person(s) Educated Patient    Methods Explanation;Demonstration;Verbal cues;Handout    Comprehension Verbalized understanding;Returned demonstration;Verbal cues required              OT Short Term Goals - 07/04/21 1104       OT SHORT TERM GOAL #1   Title Pt will be independent with HEP    Baseline grip strength, coordination needed LUE    Time 4    Period Weeks    Status New    Target Date 08/01/21      OT SHORT TERM GOAL #2   Title Pt will increase grip strength in LUE to 35 lbs or greater to increase strength and functional use of LUE, dominant hand.    Baseline L 28.6, R 70.3    Time 4    Period Weeks    Status New      OT SHORT TERM GOAL #  3   Title Pt will verbalize understanding of memory compensatory strategies    Time 4    Period Weeks    Status New      OT SHORT TERM GOAL #4   Title Pt will increase typing speed to 20 wpm net speed or greater for progressing towards proficiency with typing for return to work    Baseline 11 wpm net speed    Time 4    Period Weeks    Status New      OT SHORT TERM GOAL #5   Title Pt will legibily write 2 sentences with 75% of prior level of handwriting    Baseline 25% prior level    Time 4    Period Weeks    Status New      OT SHORT TERM GOAL #6   Title Pt will increase coordination in LUE by completing 9 hole peg test in 22 seconds or less.    Baseline R 19.21s, L 26.44s    Time 4    Period Weeks    Status New               OT Long Term Goals - 07/04/21 1103       OT LONG TERM GOAL #1   Title Pt will complete discharge FOTO with score of 73% or greater.     Baseline 61% at eval    Time 8    Period Weeks    Status New    Target Date 08/29/21      OT LONG TERM GOAL #2   Title Pt will be independent with updated HEPs    Time 8    Period Weeks    Status New      OT LONG TERM GOAL #3   Title Pt will increase grip strength in LUE to 40 lbs or greater for increasing functional use of dominant hand.    Baseline R 70.3, L 28.6    Time 8    Period Weeks    Status New      OT LONG TERM GOAL #4   Title Pt will demonstrate ability to manage a crate of 20 lbs or greater and picking up from floor to counter height with BUE and proper body mechanics for progressing towards return to work    Time 8    Period Weeks    Status New      OT LONG TERM GOAL #5   Title Pt will improve typing to 40 wpm net speed for return to work    Time 8    Period Weeks    Status New                   Plan - 07/15/21 1026     Clinical Impression Statement Pt verbalizes understanding of initial HEP.  Pt appears to demo some difficulty with isolated IP flex and reports cramping in hand at times and that she feels resistance "rubber bands" with hand use as well as LUE "heaviness"  impacting LUE functional use.    OT Occupational Profile and History Problem Focused Assessment - Including review of records relating to presenting problem    Occupational performance deficits (Please refer to evaluation for details): IADL's;ADL's;Work    Body Structure / Function / Physical Skills ADL;Decreased knowledge of use of DME;Strength;Dexterity;UE functional use;IADL;Vision;Sensation;Flexibility;FMC;Coordination    Cognitive Skills Memory    Rehab Potential Good    Clinical Decision Making Limited treatment options, no task  modification necessary    Comorbidities Affecting Occupational Performance: None    Modification or Assistance to Complete Evaluation  No modification of tasks or assist necessary to complete eval    OT Frequency 2x / week    OT Duration 8 weeks   may  finish prior depending on progress   OT Treatment/Interventions Self-care/ADL training;Fluidtherapy;DME and/or AE instruction;Therapeutic activities;Therapeutic exercise;Cognitive remediation/compensation;Visual/perceptual remediation/compensation;Functional Mobility Training;Neuromuscular education;Energy conservation;Patient/family education    Plan ?proximal strength HEP/wt. 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 [place], [place], [address] Phone: [phone]   Fax:  [phone]  Name: [name] Costa MRN: [id number] Date of Birth: 1981-03-11  Vianne Bulls, OTR/L [facility] [address] [place], [place]  [address] [phone] phone [phone] 07/15/21 3:21 PM

## 2021-07-15 NOTE — Patient Instructions (Addendum)
Access Code: [email or web address] URL: [email or web address] Date: 07/15/2021 Prepared by: Waldon Merl   Pt performed each exercise with cues for technique, benefits of balance reaction training and how to safely perform at home in corner with chair in front for support. Exercises Romberg Stance with Head Nods on Foam Pad - 1-2 x daily - 5 x weekly - 1 sets - 10 reps  ( progressed with stagger stance, eyes opened and diagonal head turns and upper trunk rotations.)  Walking March - 1-2 x daily - 5 x weekly - 1 sets - 4 reps - 3 hold  (cues for holding hip flexion for strengthening) Sidestepping - 1-2 x daily - 5 x weekly - 1 sets - 4 reps - 3 hold     (cues for maintaining squat position to work on LE strengthening and endurance.)

## 2021-07-15 NOTE — Patient Instructions (Addendum)
     Coordination Activities  Perform the following activities for 15-20 minutes 1 times per day with left hand(s).  Rotate ball in fingertips (clockwise and counter-clockwise). Flip cards 1 at a time as fast as you can. Deal cards with your thumb (Hold deck in hand and push card off top with thumb). Rotate 2 golf balls in your hand (both directions) Pick up coins and stack. Pick up coins one at a time until you get 5-10 in your hand, then move coins from palm to fingertips to stack one at a time. Practice writing.   Flexor Tendon Gliding (Active Hook Fist)   With fingers and knuckles straight, bend middle and tip joints. Do not bend large knuckles. Repeat 10 times. Do 1-2 sessions per day.   Flexor Tendon Gliding (Active Full Fist)   Straighten all fingers, then make a fist, bending all joints. Repeat 10 times. Do 1-2 sessions per day.   Flexor Tendon Gliding (Active Straight Fist)   Start with fingers straight. Bend knuckles and middle joints. Keep fingertip joints straight to touch base of palm. Repeat 10 times. Do 1-2 sessions per day.   MP Flexion (Active Isolated)   Bend ALL fingers at large knuckle, keeping other fingers straight. Do not bend tips. Repeat 10 times. Do 1-2 sessions per day.    11. Grip Strengthening (Resistive Putty)   Squeeze putty using thumb and all fingers. Repeat 15-20 times. Do 1-2 sessions per day.   Extension (Assistive Putty)   Roll putty back and forth, being sure to use all fingertips. Repeat 3 times. Do 1-2 sessions per day.  Then pinch as below.   Palmar Pinch Strengthening (Resistive Putty)   Pinch putty between thumb and each fingertip in turn after rolling out

## 2021-07-15 NOTE — Therapy (Signed)
[facility] [address] [place], [place], [address] Phone: [phone]   Fax:  [phone]  Physical Therapy Treatment  Patient Details  Name: [name] Costa MRN: [id number] Date of Birth: 05-Mar-1981 Referring Provider (PT): Angiulli, PA (to follow up with Raulkar)   Encounter Date: 07/15/2021   PT End of Session - 07/15/21 1105     Visit Number 2    Number of Visits 9    Date for PT Re-Evaluation 08/02/21    Authorization Type BCBS; VL for PT:  30    Authorization - Number of Visits 30    PT Start Time 0930    PT Stop Time 1015    PT Time Calculation (min) 45 min    Behavior During Therapy [facility] for tasks assessed/performed             Past Medical History:  Diagnosis Date   Bladder infection    Trichomonas     Past Surgical History:  Procedure Laterality Date   BUBBLE STUDY  06/06/2021   Procedure: BUBBLE STUDY;  Surgeon: [name], MD;  Location: [facility];  Service: Cardiovascular;;   CHOLECYSTECTOMY     CHOLECYSTECTOMY     TEE WITHOUT CARDIOVERSION N/A 06/06/2021   Procedure: TRANSESOPHAGEAL ECHOCARDIOGRAM (TEE);  Surgeon: [name], MD;  Location: [facility];  Service: Cardiovascular;  Laterality: N/A;   TUBAL LIGATION Bilateral 04/13/2013   Procedure: POST PARTUM TUBAL LIGATION;  Surgeon: Bing Plume, MD;  Location: [facility];  Service: Gynecology;  Laterality: Bilateral;    Vitals:   07/15/21 0941  BP: 120/80     Subjective Assessment - 07/15/21 0942     Subjective Pt is waiting for refill of BP medication.    Currently in Pain? No/denies                 Access Code: [email or web address] URL: [email or web address] Date: 07/15/2021 Prepared by: Waldon Merl   Pt performed each exercise with cues for technique, benefits of balance reaction training ,and how to safely perform at home in corner with chair in front for support.  Exercises Romberg  Stance with Head Nods on Foam Pad - 1-2 x daily - 5 x weekly - 1 sets - 10 reps  ( progressed with stagger stance, eyes opened and diagonal head turns and upper trunk rotations.)  Walking March - 1-2 x daily - 5 x weekly - 1 sets - 4 reps - 3 hold  (cues for holding hip flexion for strengthening) Sidestepping - 1-2 x daily - 5 x weekly - 1 sets - 4 reps - 3 hold     (cues for maintaining squat position to work on LE strengthening and endurance.)               [facility] Adult PT Treatment/Exercise - 07/15/21 0001       Knee/Hip Exercises: Aerobic   Stepper seated stepper, level 2.0-5.0 cues to increase speed to 50 steps/min, .                    PT Education - 07/15/21 1056     Education Details HEP for high level balance strengthening.    Person(s) Educated Patient    Methods Explanation;Demonstration;Verbal cues;Handout    Comprehension Verbalized understanding                 PT Long Term Goals - 07/04/21 1843       PT LONG TERM GOAL #1  Title Pt will be independent with HEP for improved strength, balance, transfers, and gait.  TARGET 08/02/2021    Time 4    Period Weeks    Status New      PT LONG TERM GOAL #2   Title Pt will improve FGA score to at least 28/30 to decrease fall risk.    Time 4    Period Weeks    Status New      PT LONG TERM GOAL #3   Title Pt will increased gait distance in to at least 1500 ft for improved gait efficiency and endurance for longer distance gait.    Baseline 1300 ft at eval    Time 4    Period Weeks    Status New      PT LONG TERM GOAL #4   Title FOTO will improve to predicted score of 83% at discharge    Baseline 76%    Time 4    Period Weeks    Status New                   Plan - 07/15/21 1106     Clinical Impression Statement Pt was challenged by balance exercises for HEP. Balance exercises standing on compliant surface pt able to self correct without external support.  High marching  with single leg stance moments pt required intermittent UE support.    Personal Factors and Comorbidities Behavior Pattern   positive marijuana in urine screen at admission   Examination-Activity Limitations Locomotion Level    Examination-Participation Restrictions Community Activity;Occupation    Stability/Clinical Decision Making Stable/Uncomplicated    Rehab Potential Good    PT Frequency 2x / week    PT Duration 4 weeks   plus eval; only scheduled 3 weeks, as pt may d/c early due to progres   PT Treatment/Interventions ADLs/Self Care Home Management;Gait training;Functional mobility training;Therapeutic activities;Therapeutic exercise;Balance training;Neuromuscular re-education;Patient/family education    PT Next Visit Plan check HEP for high level balance exercises; walking program for home; assess vestibular system with solid/foam surface eyes open/eyes closed    Consulted and Agree with Plan of Care Patient             Patient will benefit from skilled therapeutic intervention in order to improve the following deficits and impairments:  Abnormal gait, Decreased endurance, Decreased balance, Decreased strength, Decreased mobility  Visit Diagnosis: Unsteadiness on feet  Other abnormalities of gait and mobility  Muscle weakness (generalized)     Problem List Patient Active Problem List   Diagnosis Date Noted   Infarction of right basal ganglia (HCC) 06/07/2021   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/04/2021   TOBACCO ABUSE 10/15/2009   ALLERGIC RHINITIS 10/15/2009   ASTHMA 10/15/2009    Hortencia Conradi, PTA  07/15/21, 11:11 AM   [facility] [address] [place], [place], [address] Phone: [phone]   Fax:  [phone]  Name: Akaysha L Bathe MRN: [id number] Date of Birth: [birth year]

## 2021-07-18 ENCOUNTER — Ambulatory Visit: Payer: BC Managed Care – PPO | Admitting: Physical Therapy

## 2021-07-18 ENCOUNTER — Ambulatory Visit: Payer: BC Managed Care – PPO | Admitting: Occupational Therapy

## 2021-07-19 ENCOUNTER — Other Ambulatory Visit: Payer: Self-pay

## 2021-07-22 ENCOUNTER — Encounter: Payer: Self-pay | Admitting: Occupational Therapy

## 2021-07-22 ENCOUNTER — Ambulatory Visit: Payer: BC Managed Care – PPO | Admitting: Occupational Therapy

## 2021-07-22 ENCOUNTER — Other Ambulatory Visit: Payer: Self-pay

## 2021-07-22 ENCOUNTER — Ambulatory Visit: Payer: BC Managed Care – PPO | Admitting: Physical Therapy

## 2021-07-22 ENCOUNTER — Encounter: Payer: Self-pay | Admitting: Physical Therapy

## 2021-07-22 DIAGNOSIS — R2689 Other abnormalities of gait and mobility: Secondary | ICD-10-CM

## 2021-07-22 DIAGNOSIS — M6281 Muscle weakness (generalized): Secondary | ICD-10-CM

## 2021-07-22 DIAGNOSIS — I69852 Hemiplegia and hemiparesis following other cerebrovascular disease affecting left dominant side: Secondary | ICD-10-CM

## 2021-07-22 DIAGNOSIS — R278 Other lack of coordination: Secondary | ICD-10-CM

## 2021-07-22 DIAGNOSIS — R2681 Unsteadiness on feet: Secondary | ICD-10-CM

## 2021-07-22 DIAGNOSIS — R41842 Visuospatial deficit: Secondary | ICD-10-CM

## 2021-07-22 NOTE — Therapy (Signed)
[facility] [address] [place], [place], [address] Phone: [phone]   Fax:  [phone]  Occupational Therapy Treatment  Patient Details  Name: [name] Costa MRN: [id number] Date of Birth: [birth year] Referring Provider (OT): F/U Dr. Leeroy Cha   Encounter Date: 07/22/2021   OT End of Session - 07/22/21 1104     Visit Number 3    Number of Visits 64   may d/c early based on progress   Date for OT Re-Evaluation 08/29/21    Authorization Type BCBS    Authorization Time Period 100% OOP met VL:30 OT    Authorization - Number of Visits 30    OT Start Time 1103    OT Stop Time 1145    OT Time Calculation (min) 42 min    Activity Tolerance Patient tolerated treatment well    Behavior During Therapy [facility] for tasks assessed/performed             Past Medical History:  Diagnosis Date   Bladder infection    Trichomonas     Past Surgical History:  Procedure Laterality Date   BUBBLE STUDY  06/06/2021   Procedure: BUBBLE STUDY;  Surgeon: Donato Heinz, MD;  Location: [facility];  Service: Cardiovascular;;   CHOLECYSTECTOMY     CHOLECYSTECTOMY     TEE WITHOUT CARDIOVERSION N/A 06/06/2021   Procedure: TRANSESOPHAGEAL ECHOCARDIOGRAM (TEE);  Surgeon: Donato Heinz, MD;  Location: [facility];  Service: Cardiovascular;  Laterality: N/A;   TUBAL LIGATION Bilateral 04/13/2013   Procedure: POST PARTUM TUBAL LIGATION;  Surgeon: Melina Schools, MD;  Location: [facility];  Service: Gynecology;  Laterality: Bilateral;    There were no vitals filed for this visit.   Subjective Assessment - 07/22/21 1103     Subjective  "I left my putty on my kitchen table." Pt reports putty exercises are going well.    Pertinent History Anxiety    Limitations Fall Risk    Patient Stated Goals "get back/close to 100% as possible"    Currently in Pain? Yes    Pain Score 5     Pain Location Knee    Pain Orientation  Right;Left    Pain Descriptors / Indicators Aching    Pain Type Chronic pain    Pain Onset More than a month ago    Pain Frequency Intermittent    Aggravating Factors  weather related                          OT Treatments/Exercises (OP) - 07/22/21 1106       ADLs   ADL Comments Keyboarding/Typing - Word Tris animals for increasing typing speed and accuracy.      Exercises   Exercises Hand      Hand Exercises   Hand Gripper with Medium Beads picking up 1 inch blocks with LUE on level 3 with black spring. Pt with mod difficulty and mod drops      Neurological Re-education Exercises   Other Exercises 1 issued yellow theraband. see pt instructions MedBridge Access Code: [email or web address]    Other Weight-Bearing Exercises 1 weight bearing with lateral and anterior/posterior shifts x 10 reps for cramping in LUE hand.      Functional Reaching Activities   High Level reaching mid level to overhead with LUE to place resistance clothespins on antenna (1-8#) - little difficulty - Pt with report of significant fatigue and shoulder fatigue at end of activity.  Fine Motor Coordination (Hand/Wrist)   Fine Motor Coordination Grooved pegs    Grooved pegs with LUE with placing into board with no difficulty and removing with focus on in hand manipulation. No drops.                    OT Education - 07/22/21 1116     Education Details left handed typing sords, yellow theraband Access Code: [email or web address]    Person(s) Educated Patient    Methods Explanation;Demonstration;Verbal cues;Handout    Comprehension Verbalized understanding;Returned demonstration;Verbal cues required              OT Short Term Goals - 07/22/21 1105       OT SHORT TERM GOAL #1   Title Pt will be independent with HEP    Baseline grip strength, coordination needed LUE    Time 4    Period Weeks    Status On-going    Target Date 08/01/21      OT SHORT TERM GOAL #2   Title Pt will increase grip  strength in LUE to 35 lbs or greater to increase strength and functional use of LUE, dominant hand.    Baseline L 28.6, R 70.3    Time 4    Period Weeks    Status On-going      OT SHORT TERM GOAL #3   Title Pt will verbalize understanding of memory compensatory strategies    Time 4    Period Weeks    Status New      OT SHORT TERM GOAL #4   Title Pt will increase typing speed to 20 wpm net speed or greater for progressing towards proficiency with typing for return to work    Baseline 11 wpm net speed    Time 4    Period Weeks    Status On-going      OT SHORT TERM GOAL #5   Title Pt will legibily write 2 sentences with 75% of prior level of handwriting    Baseline 25% prior level    Time 4    Period Weeks    Status On-going      OT SHORT TERM GOAL #6   Title Pt will increase coordination in LUE by completing 9 hole peg test in 22 seconds or less.    Baseline R 19.21s, L 26.44s    Time 4    Period Weeks    Status New               OT Long Term Goals - 07/04/21 1103       OT LONG TERM GOAL #1   Title Pt will complete discharge FOTO with score of 73% or greater.    Baseline 61% at eval    Time 8    Period Weeks    Status New    Target Date 08/29/21      OT LONG TERM GOAL #2   Title Pt will be independent with updated HEPs    Time 8    Period Weeks    Status New      OT LONG TERM GOAL #3   Title Pt will increase grip strength in LUE to 40 lbs or greater for increasing functional use of dominant hand.    Baseline R 70.3, L 28.6    Time 8    Period Weeks    Status New      OT LONG TERM GOAL #4   Title Pt will demonstrate ability  to manage a crate of 20 lbs or greater and picking up from floor to counter height with BUE and proper body mechanics for progressing towards return to work    Time 8    Period Weeks    Status New      OT LONG TERM GOAL #5   Title Pt will improve typing to 40 wpm net speed for return to work    Time [place] - 07/22/21 1112     Clinical Impression Statement Pt reports she does not feel that her hand is getting any better or any worse. Pt reports success with putty HEP.    OT Occupational Profile and History Problem Focused Assessment - Including review of records relating to presenting problem    Occupational performance deficits (Please refer to evaluation for details): IADL's;ADL's;Work    Body Structure / Function / Physical Skills ADL;Decreased knowledge of use of DME;Strength;Dexterity;UE functional use;IADL;Vision;Sensation;Flexibility;FMC;Coordination    Cognitive Skills Memory    Rehab Potential Good    Clinical Decision Making Limited treatment options, no task modification necessary    Comorbidities Affecting Occupational Performance: None    Modification or Assistance to Complete Evaluation  No modification of tasks or assist necessary to complete eval    OT Frequency 2x / week    OT Duration 8 weeks   may finish prior depending on progress   OT Treatment/Interventions Self-care/ADL training;Fluidtherapy;DME and/or AE instruction;Therapeutic activities;Therapeutic exercise;Cognitive remediation/compensation;Visual/perceptual remediation/compensation;Functional Mobility Training;Neuromuscular education;Energy conservation;Patient/family education    Plan see how theraband is going, continue coordination and strength with LUE    Consulted and Agree with Plan of Care Patient             Patient will benefit from skilled therapeutic intervention in order to improve the following deficits and impairments:   Body Structure / Function / Physical Skills: ADL, Decreased knowledge of use of DME, Strength, Dexterity, UE functional use, IADL, Vision, Sensation, Flexibility, FMC, Coordination Cognitive Skills: Memory     Visit Diagnosis: 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 [place] [employer], [place], [address] Phone: [phone]   Fax:  [phone]  Name: Edith L Segoviano MRN: [id number] Date of Birth: 1981/10/31

## 2021-07-22 NOTE — Therapy (Signed)
[facility] [address] [place], [place], [address] Phone: [phone]   Fax:  [phone]  Physical Therapy Treatment  Patient Details  Name: [name] Costa MRN: [id number] Date of Birth: 03/14/1981 Referring Provider (PT): Angiulli, PA (to follow up with Raulkar)   Encounter Date: 07/22/2021   PT End of Session - 07/22/21 1102     Visit Number 3    Number of Visits 9    Date for PT Re-Evaluation 08/02/21    Authorization Type BCBS; VL for PT:  30    Authorization - Number of Visits 30    PT Start Time 1020    PT Stop Time 1100    PT Time Calculation (min) 40 min    Behavior During Therapy [facility] for tasks assessed/performed             Past Medical History:  Diagnosis Date   Bladder infection    Trichomonas     Past Surgical History:  Procedure Laterality Date   BUBBLE STUDY  06/06/2021   Procedure: BUBBLE STUDY;  Surgeon: [name], MD;  Location: [facility] ENDOSCOPY;  Service: Cardiovascular;;   CHOLECYSTECTOMY     CHOLECYSTECTOMY     TEE WITHOUT CARDIOVERSION N/A 06/06/2021   Procedure: TRANSESOPHAGEAL ECHOCARDIOGRAM (TEE);  Surgeon: [name], MD;  Location: [facility] ENDOSCOPY;  Service: Cardiovascular;  Laterality: N/A;   TUBAL LIGATION Bilateral 04/13/2013   Procedure: POST PARTUM TUBAL LIGATION;  Surgeon: Bing Plume, MD;  Location: [facility];  Service: Gynecology;  Laterality: Bilateral;    There were no vitals filed for this visit.   Subjective Assessment - 07/22/21 1022     Subjective Pt has not been doing HEP for PT but for OT; "no reason why".  Doing "regular exercise" HEP from hospital. Knees ache when weather is rainy.    Currently in Pain? Yes    Pain Score 5     Pain Location Knee    Pain Orientation Right;Left    Pain Descriptors / Indicators Aching    Pain Type Chronic pain    Pain Onset More than a month ago    Pain Frequency Intermittent    Aggravating Factors   knees bending    Pain Relieving Factors rest, no movement.                               [facility] Adult PT Treatment/Exercise - 07/22/21 0001       Knee/Hip Exercises: Aerobic   Stepper seated stepper, for warmup due to knees feeling sore and achy, level 1.0, all extremities, 6 min, reports pain is the same, not higher.                 Balance Exercises - 07/22/21 0001       Balance Exercises: Standing   Standing Eyes Opened Head turns;Narrow base of support (BOS);Foam/compliant surface;5 reps   eyes open progressing with eyes closed; pt self corrected imbalance without UE support   Stepping Strategy Anterior;Posterior;Foam/compliant surface   cues for body mechanics with weight shifts when stepping on/off foam pad progressing with holding step position with head turns.   Rockerboard Anterior/posterior;Other reps (comment)   x20 ankle strategies then ankle/hip strategies   Retro Gait 4 reps   cues for reaching back with ball of foot for full step length, pt self corrected imbalance.   Marching Solid surface;Intermittent upper extremity assist;Forwards   cues  for hold hip flexed for SLS, cues for upright gaze.              PT Education - 07/22/21 1141     Education Details Reviewed LTGs and importance of HEP compliance.    Person(s) Educated Patient    Methods Explanation    Comprehension Verbalized understanding                 PT Long Term Goals - 07/04/21 1843       PT LONG TERM GOAL #1   Title Pt will be independent with HEP for improved strength, balance, transfers, and gait.  TARGET 08/02/2021    Time 4    Period Weeks    Status New      PT LONG TERM GOAL #2   Title Pt will improve FGA score to at least 28/30 to decrease fall risk.    Time 4    Period Weeks    Status New      PT LONG TERM GOAL #3   Title Pt will increased gait distance in to at least 1500 ft for improved gait efficiency and endurance for longer distance  gait.    Baseline 1300 ft at eval    Time 4    Period Weeks    Status New      PT LONG TERM GOAL #4   Title FOTO will improve to predicted score of 83% at discharge    Baseline 76%    Time 4    Period Weeks    Status New                   Plan - 07/22/21 1103     Clinical Impression Statement Pt is making progress toward LTGs. Reports that she is progressing with overall endurance with functional activities but still has some off days with more fatigue with less L knee control.  Occasionally it will hyperextend when she is fatigued and knee will hurt.    Personal Factors and Comorbidities Behavior Pattern   positive marijuana in urine screen at admission   Examination-Activity Limitations Locomotion Level    Examination-Participation Restrictions Community Activity;Occupation    Stability/Clinical Decision Making Stable/Uncomplicated    Rehab Potential Good    PT Frequency 2x / week    PT Duration 4 weeks   plus eval; only scheduled 3 weeks, as pt may d/c early due to progres   PT Treatment/Interventions ADLs/Self Care Home Management;Gait training;Functional mobility training;Therapeutic activities;Therapeutic exercise;Balance training;Neuromuscular re-education;Patient/family education    PT Next Visit Plan check HEP for high level balance exercises; L hamstring strengthening for knee control.    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 [place], [place], [address] Phone: [phone]   Fax:  [phone]  Name: [name] Costa MRN: [id number] Date of Birth: 12/13/1980

## 2021-07-22 NOTE — Patient Instructions (Addendum)
  Typing:  Left-handed Words  wear were are bear bare care dear deer stare react dare  sad gear great rate  date red read fear free tea tear fast waste treat tease zest wax cast vase vast cat basket earn casket bee seat ate gas rare seed tax fax feed cart art tart taste ear here    Access Code: [email or web address] URL: [email or web address] Date: 07/22/2021 Prepared by: Kallie Edward  Exercises Standing Elbow Extension with Self-Anchored Resistance - 1 x daily - 7 x weekly - 3 sets - 10 reps Standing Elbow Flexion with Self-Anchored Resistance - 1 x daily - 7 x weekly - 3 sets - 10 reps Standing Shoulder Horizontal Abduction with Resistance - 1 x daily - 7 x weekly - 3 sets - 10 reps Standing Shoulder Flexion with Resistance - 1 x daily - 7 x weekly - 3 sets - 10 reps Standing Shoulder Diagonal Horizontal Abduction 60/120 Degrees with Resistance - 1 x daily - 7 x weekly - 3 sets - 10 reps Standing Single Arm Shoulder Abduction with Resistance - 1 x daily - 7 x weekly - 3 sets - 10 reps Single Arm Punch with Resistance - 1 x daily - 7 x weekly - 3 sets - 10 reps

## 2021-07-26 ENCOUNTER — Ambulatory Visit: Payer: BC Managed Care – PPO | Admitting: Occupational Therapy

## 2021-07-26 ENCOUNTER — Other Ambulatory Visit: Payer: Self-pay

## 2021-07-26 ENCOUNTER — Encounter: Payer: Self-pay | Admitting: Occupational Therapy

## 2021-07-26 ENCOUNTER — Encounter: Payer: Self-pay | Admitting: Physical Therapy

## 2021-07-26 ENCOUNTER — Ambulatory Visit: Payer: BC Managed Care – PPO | Admitting: Physical Therapy

## 2021-07-26 DIAGNOSIS — I69852 Hemiplegia and hemiparesis following other cerebrovascular disease affecting left dominant side: Secondary | ICD-10-CM | POA: Diagnosis not present

## 2021-07-26 DIAGNOSIS — R41842 Visuospatial deficit: Secondary | ICD-10-CM

## 2021-07-26 DIAGNOSIS — M6281 Muscle weakness (generalized): Secondary | ICD-10-CM

## 2021-07-26 DIAGNOSIS — R2681 Unsteadiness on feet: Secondary | ICD-10-CM

## 2021-07-26 DIAGNOSIS — R278 Other lack of coordination: Secondary | ICD-10-CM

## 2021-07-26 DIAGNOSIS — R2689 Other abnormalities of gait and mobility: Secondary | ICD-10-CM

## 2021-07-26 NOTE — Therapy (Signed)
[facility] [address] [place], [place], [address] Phone: [phone]   Fax:  [phone]  Occupational Therapy Treatment  Patient Details  Name: [name] Costa MRN: [id number] Date of Birth: October 19, 1981 Referring Provider (OT): F/U Dr. Leeroy Cha   Encounter Date: 07/26/2021   OT End of Session - 07/26/21 0852     Visit Number 4    Number of Visits 30   may d/c early based on progress   Date for OT Re-Evaluation 08/29/21    Authorization Type BCBS    Authorization Time Period 100% OOP met VL:30 OT    Authorization - Number of Visits 30    OT Start Time 0849    OT Stop Time 0930    OT Time Calculation (min) 41 min    Activity Tolerance Patient tolerated treatment well    Behavior During Therapy [facility] for tasks assessed/performed             Past Medical History:  Diagnosis Date   Bladder infection    Trichomonas     Past Surgical History:  Procedure Laterality Date   BUBBLE STUDY  06/06/2021   Procedure: BUBBLE STUDY;  Surgeon: Donato Heinz, MD;  Location: [place];  Service: Cardiovascular;;   CHOLECYSTECTOMY     CHOLECYSTECTOMY     TEE WITHOUT CARDIOVERSION N/A 06/06/2021   Procedure: TRANSESOPHAGEAL ECHOCARDIOGRAM (TEE);  Surgeon: Donato Heinz, MD;  Location: [place];  Service: Cardiovascular;  Laterality: N/A;   TUBAL LIGATION Bilateral 04/13/2013   Procedure: POST PARTUM TUBAL LIGATION;  Surgeon: Melina Schools, MD;  Location: [facility];  Service: Gynecology;  Laterality: Bilateral;    There were no vitals filed for this visit.   Subjective Assessment - 07/26/21 0850     Subjective  "doing good - no plans for the weekend" Pt denies any pain. Pt reports leg had sharp pain on Tuesday radiating down towards foot but resolved.    Pertinent History Anxiety    Limitations Fall Risk    Patient Stated Goals "get back/close to 100% as possible"    Currently in Pain?  No/denies                          OT Treatments/Exercises (OP) - 07/26/21 0856       Exercises   Exercises Work Hardening      Work Hardening Exercises   UBE (Upper Arm Bike) x 6 minutes (forward only) level 2 for reciprocal movements, conditioning and strengthening      Modalities   Modalities Fluidotherapy      LUE Fluidotherapy   Number Minutes Fluidotherapy 10 Minutes    LUE Fluidotherapy Location Hand    Comments Stiffness      Fine Motor Coordination (Hand/Wrist)   Fine Motor Coordination Handwriting;Small Pegboard    Small Pegboard with LUE with placing small pegs into peg board with good coordination and good time. Pt with min difficulty and drops. removed with in hand manipulation.    Handwriting distal finger control worksheet with LUE with slight deviation but good control with staying in boundaries                      OT Short Term Goals - 07/22/21 1105       OT SHORT TERM GOAL #1   Title Pt will be independent with HEP    Baseline grip strength, coordination needed LUE  Time 4    Period Weeks    Status On-going    Target Date 08/01/21      OT SHORT TERM GOAL #2   Title Pt will increase grip strength in LUE to 35 lbs or greater to increase strength and functional use of LUE, dominant hand.    Baseline L 28.6, R 70.3    Time 4    Period Weeks    Status On-going      OT SHORT TERM GOAL #3   Title Pt will verbalize understanding of memory compensatory strategies    Time 4    Period Weeks    Status New      OT SHORT TERM GOAL #4   Title Pt will increase typing speed to 20 wpm net speed or greater for progressing towards proficiency with typing for return to work    Baseline 11 wpm net speed    Time 4    Period Weeks    Status On-going      OT SHORT TERM GOAL #5   Title Pt will legibily write 2 sentences with 75% of prior level of handwriting    Baseline 25% prior level    Time 4    Period Weeks    Status On-going       OT SHORT TERM GOAL #6   Title Pt will increase coordination in LUE by completing 9 hole peg test in 22 seconds or less.    Baseline R 19.21s, L 26.44s    Time 4    Period Weeks    Status New               OT Long Term Goals - 07/04/21 1103       OT LONG TERM GOAL #1   Title Pt will complete discharge FOTO with score of 73% or greater.    Baseline 61% at eval    Time 8    Period Weeks    Status New    Target Date 08/29/21      OT LONG TERM GOAL #2   Title Pt will be independent with updated HEPs    Time 8    Period Weeks    Status New      OT LONG TERM GOAL #3   Title Pt will increase grip strength in LUE to 40 lbs or greater for increasing functional use of dominant hand.    Baseline R 70.3, L 28.6    Time 8    Period Weeks    Status New      OT LONG TERM GOAL #4   Title Pt will demonstrate ability to manage a crate of 20 lbs or greater and picking up from floor to counter height with BUE and proper body mechanics for progressing towards return to work    Time 8    Period Weeks    Status New      OT LONG TERM GOAL #5   Title Pt will improve typing to 40 wpm net speed for return to work    Time 8    Period Weeks    Status New                   Plan - 07/26/21 3532     Clinical Impression Statement Pt reports HEPs are going well and things are getting better. Continue progressing towards goals.    OT Occupational Profile and History Problem Focused Assessment - Including review of records relating to  presenting problem    Occupational performance deficits (Please refer to evaluation for details): IADL's;ADL's;Work    Body Structure / Function / Physical Skills ADL;Decreased knowledge of use of DME;Strength;Dexterity;UE functional use;IADL;Vision;Sensation;Flexibility;FMC;Coordination    Cognitive Skills Memory    Rehab Potential Good    Clinical Decision Making Limited treatment options, no task modification necessary    Comorbidities  Affecting Occupational Performance: None    Modification or Assistance to Complete Evaluation  No modification of tasks or assist necessary to complete eval    OT Frequency 2x / week    OT Duration 8 weeks   may finish prior depending on progress   OT Treatment/Interventions Self-care/ADL training;Fluidtherapy;DME and/or AE instruction;Therapeutic activities;Therapeutic exercise;Cognitive remediation/compensation;Visual/perceptual remediation/compensation;Functional Mobility Training;Neuromuscular education;Energy conservation;Patient/family education    Plan arm bike, LUE exercises for strengthening and coordination, handwriting, typing, check STGs    Consulted and Agree with Plan of Care Patient             Patient will benefit from skilled therapeutic intervention in order to improve the following deficits and impairments:   Body Structure / Function / Physical Skills: ADL, Decreased knowledge of use of DME, Strength, Dexterity, UE functional use, IADL, Vision, Sensation, Flexibility, FMC, Coordination Cognitive Skills: Memory     Visit Diagnosis: 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

## 2021-07-26 NOTE — Patient Instructions (Signed)
Access Code: [email or web address] URL: [email or web address] Date: 07/26/2021 Prepared by: Lonia Blood  Exercises Romberg Stance with Head Nods on Foam Pad - 1-2 x daily - 5 x weekly - 1 sets - 10 reps Staggered Stance Forward Backward Weight Shift with Counter Support - 1-2 x daily - 5 x weekly - 1-2 sets - 10 reps Forward Step Up - 1 x daily - 5 x weekly - 2 sets - 10 reps Lateral Step Up - 1 x daily - 5 x weekly - 1-2 sets - 10 reps Standing Gastroc Stretch on Step - 1 x daily - 5 x weekly - 1 sets - 3 reps - 15-30 sec hold Standing Gastroc Stretch - 1 x daily - 5 x weekly - 1 sets - 3 reps - 15-30 sec hold Seated Hamstring Stretch - 1 x daily - 5 x weekly - 1 sets - 3 reps - 15-30 sec hold

## 2021-07-26 NOTE — Therapy (Signed)
[facility] [address] [place], [place], [address] Phone: [phone]   Fax:  [phone]  Physical Therapy Treatment  Patient Details  Name: [name] Costa MRN: [id number] Date of Birth: 1981/10/14 Referring Provider (PT): Angiulli, [place] (to follow up with Raulkar)   Encounter Date: 07/26/2021   PT End of Session - 07/26/21 0928     Visit Number 4    Number of Visits 9    Date for PT Re-Evaluation 08/02/21    Authorization Type BCBS; VL for PT:  30    Authorization - Number of Visits 30    PT Start Time 0932    PT Stop Time 1017    PT Time Calculation (min) 45 min    Activity Tolerance Patient tolerated treatment well    Behavior During Therapy [facility] for tasks assessed/performed             Past Medical History:  Diagnosis Date   Bladder infection    Trichomonas     Past Surgical History:  Procedure Laterality Date   BUBBLE STUDY  06/06/2021   Procedure: BUBBLE STUDY;  Surgeon: [name], MD;  Location: [facility] ENDOSCOPY;  Service: Cardiovascular;;   CHOLECYSTECTOMY     CHOLECYSTECTOMY     TEE WITHOUT CARDIOVERSION N/A 06/06/2021   Procedure: TRANSESOPHAGEAL ECHOCARDIOGRAM (TEE);  Surgeon: [name], MD;  Location: [facility] ENDOSCOPY;  Service: Cardiovascular;  Laterality: N/A;   TUBAL LIGATION Bilateral 04/13/2013   Procedure: POST PARTUM TUBAL LIGATION;  Surgeon: Bing Plume, MD;  Location: [facility];  Service: Gynecology;  Laterality: Bilateral;    There were no vitals filed for this visit.   Subjective Assessment - 07/26/21 0928     Subjective Been doing the leg (PT exercises) the last 2 days; no problems from exercises.  No pain today.  Had a lot of pain in L leg on Tuesday, started in the middle of the night and was painful to touch.  Better today.    Patient Stated Goals No specific goals.  At end of session, pt is agreeable to working on high level balance and gait endurance for  return to work activities.    Currently in Pain? No/denies    Pain Onset More than a month ago                               [facility] Adult PT Treatment/Exercise - 07/26/21 0943       Ambulation/Gait   Ambulation/Gait Yes    Ambulation/Gait Assistance 6: Modified independent (Device/Increase time)    Ambulation Distance (Feet) 800 Feet   320   Assistive device None    Gait Pattern Step-through pattern   tends to look at the ground   Ambulation Surface Level;Indoor      Knee/Hip Exercises: Stretches   Active Hamstring Stretch Left;3 reps;30 seconds    Active Hamstring Stretch Limitations foot propped on floor    Gastroc Stretch Left;3 reps;20 seconds    Gastroc Stretch Limitations Foot propped at 4" step    Other Knee/Hip Stretches Runner's stretch, each foot position, 3 reps 15 seconds, cues for foot position for optimal stretch.      Knee/Hip Exercises: Aerobic   Stepper seated stepper, lower extremities only,  level 1.5, 6 min, cues to keep full L foot, including heel on foot rest, for lower extremity strengthening.      Knee/Hip Exercises: Standing  Lateral Step Up Right;Left;1 set;10 reps;Hand Hold: 0;Step Height: 6"    Lateral Step Up Limitations single leg step up and opposite leg lift    Forward Step Up Right;Left;2 sets;10 reps;Hand Hold: 0;Step Height: 6"    Forward Step Up Limitations Step up/up, down/down 1st set; then single step up with opposite leg lift, 2nd rep.                 Balance Exercises - 07/26/21 0001       Balance Exercises: Standing   SLS with Vectors Solid surface;Limitations    SLS with Vectors Limitations Alt step taps to 6" step, x 10 reps, then coordinated opposite hand/knee taps x 10    Other Standing Exercises Comments Stagger stance rocking forward/back, 15 reps cues for technique for ankle dorsiflexion, plantarflexion            Review of corner balance as part of HEP:  Exercises Romberg Stance with Head  Nods on Foam Pad - 1-2 x daily - 5 x weekly - 1 sets - 10 reps  ( progressed with stagger stance, eyes opened and diagonal head turns and upper trunk rotations.)   1.Feet together eyes closed head turns/nods x10 each 2.Stagger stance- eyes open, head diagonals each direction x5 + upper trunk rotations 3sec holds each side.  Switch feet in front.  Discussed how to add/change cushion at home to make it more challenging, as she says one pillow doesn't feel enough.    PT Education - 07/26/21 1155     Education Details Additions to HEP (took out marching, squats)-see instructions    Person(s) Educated Patient    Methods Explanation;Demonstration;Handout    Comprehension Verbalized understanding;Returned demonstration                 PT Long Term Goals - 07/04/21 1843       PT LONG TERM GOAL #1   Title Pt will be independent with HEP for improved strength, balance, transfers, and gait.  TARGET 08/02/2021    Time 4    Period Weeks    Status New      PT LONG TERM GOAL #2   Title Pt will improve FGA score to at least 28/30 to decrease fall risk.    Time 4    Period Weeks    Status New      PT LONG TERM GOAL #3   Title Pt will increased gait distance in to at least 1500 ft for improved gait efficiency and endurance for longer distance gait.    Baseline 1300 ft at eval    Time 4    Period Weeks    Status New      PT LONG TERM GOAL #4   Title FOTO will improve to predicted score of 83% at discharge    Baseline 76%    Time 4    Period Weeks    Status New                   Plan - 07/26/21 1156     Clinical Impression Statement Focused skilled PT session on modifying/upgrading HEP to address LLE functional strength, single limb stance as well as LLE stretching for flexibility through foot/ankle.  Pt reports she conitnues to have some fatigue through LLE later in the day, and discussed to rest as needed/avoid pushing through during these times; also to exagerate  heel strike/foot clearance during these times as ways to manage LLE fatigue.  She  will continue to benefit from skilled PT to address balance, strength, gait for improved functional mobility and return to work activities.    Personal Factors and Comorbidities Behavior Pattern   positive marijuana in urine screen at admission   Examination-Activity Limitations Locomotion Level    Examination-Participation Restrictions Community Activity;Occupation    Stability/Clinical Decision Making Stable/Uncomplicated    Rehab Potential Good    PT Frequency 2x / week    PT Duration 4 weeks   plus eval; only scheduled 3 weeks, as pt may d/c early due to progres   PT Treatment/Interventions ADLs/Self Care Home Management;Gait training;Functional mobility training;Therapeutic activities;Therapeutic exercise;Balance training;Neuromuscular re-education;Patient/family education    PT Next Visit Plan Check additions to HEP; pt is only scheduled through week of 8/29 and feels she will likely be ready for d/c at that time.  Check LTGs and make any continued fitness/HEP recommendations; plan for d/c next week.    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 07/26/2021, 12:01 PM Gean Maidens., PT   [facility] [address] [place], [place],  [address] Phone: [phone]   Fax:  [phone]  Name: Deema L Hainsworth MRN: [id number] Date of Birth: 05/27/1981

## 2021-07-29 ENCOUNTER — Encounter: Payer: Self-pay | Admitting: Occupational Therapy

## 2021-07-29 ENCOUNTER — Other Ambulatory Visit: Payer: Self-pay

## 2021-07-29 ENCOUNTER — Ambulatory Visit: Payer: BC Managed Care – PPO | Admitting: Occupational Therapy

## 2021-07-29 ENCOUNTER — Ambulatory Visit: Payer: BC Managed Care – PPO | Admitting: Physical Therapy

## 2021-07-29 ENCOUNTER — Encounter: Payer: Self-pay | Admitting: Physical Therapy

## 2021-07-29 DIAGNOSIS — R278 Other lack of coordination: Secondary | ICD-10-CM

## 2021-07-29 DIAGNOSIS — I69852 Hemiplegia and hemiparesis following other cerebrovascular disease affecting left dominant side: Secondary | ICD-10-CM

## 2021-07-29 DIAGNOSIS — M6281 Muscle weakness (generalized): Secondary | ICD-10-CM

## 2021-07-29 DIAGNOSIS — R2681 Unsteadiness on feet: Secondary | ICD-10-CM

## 2021-07-29 DIAGNOSIS — R41842 Visuospatial deficit: Secondary | ICD-10-CM

## 2021-07-29 NOTE — Therapy (Addendum)
[facility] [address] [place], [place], [address] Phone: [phone]   Fax:  [phone]  Physical Therapy Treatment/Discharge Summary  Patient Details  Name: [name] Costa MRN: [id number] Date of Birth: February 05, 1981 Referring Provider (PT): Alvarado, Sylva (to follow up with Raulkar)   PHYSICAL THERAPY DISCHARGE SUMMARY  Visits from Start of Care: 5  Current functional level related to goals / functional outcomes:  PT Long Term Goals - 07/29/21 1021       PT LONG TERM GOAL #1   Title Pt will be independent with HEP for improved strength, balance, transfers, and gait.  TARGET 08/02/2021    Baseline Pt verbalized and demonstrated understanding of HEP. 07/29/21    Time 4    Period Weeks    Status Achieved      PT LONG TERM GOAL #2   Title Pt will improve FGA score to at least 28/30 to decrease fall risk.    Baseline Met, 30/30. 07/29/21.    Time 4    Period Weeks    Status Achieved      PT LONG TERM GOAL #3   Title Pt will increased gait distance in 6MWT to at least 1500 ft for improved gait efficiency and endurance for longer distance gait.    Baseline 1364 ft at D/C. Pt was walking "normal speed" but after test stated that she could have walked faster if needed. 07/29/21    Time 4    Period Weeks    Status Not Met      PT LONG TERM GOAL #4   Title FOTO will improve to predicted score of 83% at discharge    Baseline 86%. 07/29/21.  Pt stated that she still had difficulty with running on FOTO.    Time 4    Period Weeks    Status Achieved           Pt has met 3 of 4 LTGs.   Remaining deficits: Pt reported fatigue/slower gait, but could walk faster if needed.   Education / Equipment: Educated in [place]   Patient agrees to discharge. Patient goals were partially met. Patient is being discharged due to being pleased with the current functional level.  Mady Haagensen, PT 07/30/21 2:33 PM Phone:  [phone] Fax: [phone]   Encounter Date: 07/29/2021   PT End of Session - 07/29/21 0941     Visit Number 5    Number of Visits 9    Date for PT Re-Evaluation 08/02/21    Authorization Type BCBS; VL for PT:  9    Authorization - Number of Visits 30    PT Start Time 0934    PT Stop Time 1017    PT Time Calculation (min) 43 min    Activity Tolerance Patient tolerated treatment well    Behavior During Therapy [facility] for tasks assessed/performed             Past Medical History:  Diagnosis Date   Bladder infection    Trichomonas     Past Surgical History:  Procedure Laterality Date   BUBBLE STUDY  06/06/2021   Procedure: BUBBLE STUDY;  Surgeon: Donato Heinz, MD;  Location: [facility];  Service: Cardiovascular;;   CHOLECYSTECTOMY     CHOLECYSTECTOMY     TEE WITHOUT CARDIOVERSION N/A 06/06/2021   Procedure: TRANSESOPHAGEAL ECHOCARDIOGRAM (TEE);  Surgeon: Donato Heinz, MD;  Location: [facility] END[facility];  Service: Cardiovascular;  Laterality: N/A;   TUBAL LIGATION Bilateral 04/13/2013  Procedure: POST PARTUM TUBAL LIGATION;  Surgeon: Melina Schools, MD;  Location: [facility];  Service: Gynecology;  Laterality: Bilateral;    There were no vitals filed for this visit.   Subjective Assessment - 07/29/21 0938     Subjective Pt is ready to discharge today and liked the exercises she was given last week.  Feels comfortable with the exercises.    Patient Stated Goals No specific goals.  At end of session, pt is agreeable to working on high level balance and gait endurance for return to work activities.    Currently in Pain? Yes    Pain Onset More than a month ago                [facility] PT Assessment - 07/29/21 0001       Functional Gait  Assessment   Gait assessed  Yes    Gait Level Surface Walks 20 ft in less than 5.5 sec, no assistive devices, good speed, no evidence for imbalance, normal gait pattern, deviates no more than 6 in outside of the 12 in  walkway width.    Change in Gait Speed Able to smoothly change walking speed without loss of balance or gait deviation. Deviate no more than 6 in outside of the 12 in walkway width.    Gait with Horizontal Head Turns Performs head turns smoothly with no change in gait. Deviates no more than 6 in outside 12 in walkway width    Gait with Vertical Head Turns Performs head turns with no change in gait. Deviates no more than 6 in outside 12 in walkway width.    Gait and Pivot Turn Pivot turns safely within 3 sec and stops quickly with no loss of balance.    Step Over Obstacle Is able to step over 2 stacked shoe boxes taped together (9 in total height) without changing gait speed. No evidence of imbalance.    Gait with Narrow Base of Support Is able to ambulate for 10 steps heel to toe with no staggering.    Gait with Eyes Closed Walks 20 ft, no assistive devices, good speed, no evidence of imbalance, normal gait pattern, deviates no more than 6 in outside 12 in walkway width. Ambulates 20 ft in less than 7 sec.    Ambulating Backwards Walks 20 ft, no assistive devices, good speed, no evidence for imbalance, normal gait    Steps Alternating feet, no rail.    Total Score 30    FGA comment: Max score 30  Interpretation: 25-28 = low risk fall                           [facility] Adult PT Treatment/Exercise - 07/29/21 0001       Ambulation/Gait   Ambulation/Gait Yes    Ambulation/Gait Assistance 6: Modified independent (Device/Increase time)    Ambulation Distance (Feet) 1364 Feet    Assistive device None    Gait Pattern Step-through pattern    Ambulation Surface Level    Gait Comments Pt performed 6MWT no rest breaks needed, no imbalance noted.      Knee/Hip Exercises: Stretches   Active Hamstring Stretch Both;1 rep;30 seconds                    PT Education - 07/29/21 1019     Education Details Verbally reviewed HEP, dicussed d/c today and pt is agreeable, discussed  LTGs checked and results.  Person(s) Educated Patient    Methods Explanation    Comprehension Verbalized understanding                 PT Long Term Goals - 07/29/21 1021       PT LONG TERM GOAL #1   Title Pt will be independent with HEP for improved strength, balance, transfers, and gait.  TARGET 08/02/2021    Baseline Pt verbalized and demonstrated understanding of HEP. 07/29/21    Time 4    Period Weeks    Status Achieved      PT LONG TERM GOAL #2   Title Pt will improve FGA score to at least 28/30 to decrease fall risk.    Baseline Met, 30/30. 07/29/21.    Time 4    Period Weeks    Status Achieved      PT LONG TERM GOAL #3   Title Pt will increased gait distance in 6MWT to at least 1500 ft for improved gait efficiency and endurance for longer distance gait.    Baseline 1364 ft at D/C. Pt was walking "normal speed" but after test stated that she could have walked faster if needed. 07/29/21    Time 4    Period Weeks    Status Not Met      PT LONG TERM GOAL #4   Title FOTO will improve to predicted score of 83% at discharge    Baseline 86%. 07/29/21.  Pt stated that she still had difficulty with running on FOTO.    Time 4    Period Weeks    Status Not Met                   Plan - 07/29/21 1025     Clinical Impression Statement Pt reported being ready for d/c today.  Pt demonstrated improvement with functional gait, strength, and balance from eval.  See specifics above in LTG section.    Personal Factors and Comorbidities Behavior Pattern   positive marijuana in urine screen at admission   Examination-Activity Limitations Locomotion Level    Examination-Participation Restrictions Community Activity;Occupation    Stability/Clinical Decision Making Stable/Uncomplicated    Rehab Potential Good    PT Frequency 2x / week    PT Duration 4 weeks   plus eval; only scheduled 3 weeks, as pt may d/c early due to progres   PT Treatment/Interventions ADLs/Self Care  Home Management;Gait training;Functional mobility training;Therapeutic activities;Therapeutic exercise;Balance training;Neuromuscular re-education;Patient/family education    PT Next Visit Plan send d/c summary to MD.    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 [place], [place], [address] Phone: [phone]   Fax:  [phone]  Name: Tawnya L Wenzlick MRN: [id number] Date of Birth: 07/06/1981   Mady Haagensen, PT 07/30/21 2:33 PM Phone: [phone] Fax: [phone]

## 2021-07-29 NOTE — Therapy (Signed)
[facility] [address] [place], [place], [address] Phone: [phone]   Fax:  [phone]  Occupational Therapy Treatment/Goal Check (STGs)  Patient Details  Name: [name] Costa MRN: [id number] Date of Birth: 07-Apr-1981 Referring Provider (OT): F/U Dr. Leeroy Cha   Encounter Date: 07/29/2021   OT End of Session - 07/29/21 1018     Visit Number 5    Number of Visits 17   may d/c early based on progress   Date for OT Re-Evaluation 08/29/21    Authorization Type BCBS    Authorization Time Period 100% OOP met VL:30 OT    Authorization - Number of Visits 30    OT Start Time 1017    OT Stop Time 1057    OT Time Calculation (min) 40 min    Activity Tolerance Patient tolerated treatment well    Behavior During Therapy [facility] for tasks assessed/performed             Past Medical History:  Diagnosis Date   Bladder infection    Trichomonas     Past Surgical History:  Procedure Laterality Date   BUBBLE STUDY  06/06/2021   Procedure: BUBBLE STUDY;  Surgeon: Donato Heinz, MD;  Location: [facility];  Service: Cardiovascular;;   CHOLECYSTECTOMY     CHOLECYSTECTOMY     TEE WITHOUT CARDIOVERSION N/A 06/06/2021   Procedure: TRANSESOPHAGEAL ECHOCARDIOGRAM (TEE);  Surgeon: Donato Heinz, MD;  Location: [facility];  Service: Cardiovascular;  Laterality: N/A;   TUBAL LIGATION Bilateral 04/13/2013   Procedure: POST PARTUM TUBAL LIGATION;  Surgeon: Melina Schools, MD;  Location: [facility];  Service: Gynecology;  Laterality: Bilateral;    There were no vitals filed for this visit.   Subjective Assessment - 07/29/21 1017     Subjective  "I went ahead and discharged from PT"    Pertinent History Anxiety    Limitations Fall Risk    Patient Stated Goals "get back/close to 100% as possible"    Currently in Pain? No/denies                    Checked STGs.      OT Treatments/Exercises  (OP) - 07/29/21 1041       Work Hardening Exercises   Stationary Bike Arm Bike 8 minutes (forward and backward) level 2.5 for conditioning and endurance and reciprocal movements      Neurological Re-education Exercises   Other Exercises 1 3.3 med ball exercises x 10 reps - shoulder flexion, chest press, horizontal abduction, bicep curl, overhead press      Fine Motor Coordination (Hand/Wrist)   Fine Motor Coordination O'Connor pegs    O'Connor pegs with LUE with tweezers - with approrpiate time and cooridnation                    OT Education - 07/29/21 1034     Education Details memory compensation strategies. upgraded to red theraband.    Person(s) Educated Patient    Methods Explanation;Handout    Comprehension Verbalized understanding              OT Short Term Goals - 07/29/21 1019       OT SHORT TERM GOAL #1   Title Pt will be independent with HEP    Baseline grip strength, coordination needed LUE    Time 4    Period Weeks    Status Achieved    Target Date 08/01/21  OT SHORT TERM GOAL #2   Title Pt will increase grip strength in LUE to 35 lbs or greater to increase strength and functional use of LUE, dominant hand.    Baseline L 28.6, R 70.3    Time 4    Period Weeks    Status Achieved   LUE 60.6 lbs 07/29/21     OT SHORT TERM GOAL #3   Title Pt will verbalize understanding of memory compensatory strategies    Time 4    Period Weeks    Status Achieved      OT SHORT TERM GOAL #4   Title Pt will increase typing speed to 20 wpm net speed or greater for progressing towards proficiency with typing for return to work    Baseline 11 wpm net speed    Time 4    Period Weeks    Status Achieved   07/29/21 Typing Test 24 wpm net speed     OT SHORT TERM GOAL #5   Title Pt will legibily write 2 sentences with 75% of prior level of handwriting    Baseline 25% prior level    Time 4    Period Weeks    Status Achieved   75% of PLOF prior to CVA 07/29/21      OT SHORT TERM GOAL #6   Title Pt will increase coordination in LUE by completing 9 hole peg test in 22 seconds or less.    Baseline R 19.21s, L 26.44s    Time 4    Period Weeks    Status Achieved   LUE 18.81s 07/29/21              OT Long Term Goals - 07/29/21 1020       OT LONG TERM GOAL #1   Title Pt will complete discharge FOTO with score of 73% or greater.    Baseline 61% at eval    Time 8    Period Weeks    Status New      OT LONG TERM GOAL #2   Title Pt will be independent with updated HEPs    Time 8    Period Weeks    Status New      OT LONG TERM GOAL #3   Title Pt will increase grip strength in LUE to 40 lbs or greater for increasing functional use of dominant hand.    Baseline R 70.3, L 28.6    Time 8    Period Weeks    Status Achieved   60.6 lbs with LUE 07/29/21     OT LONG TERM GOAL #4   Title Pt will demonstrate ability to manage a crate of 20 lbs or greater and picking up from floor to counter height with BUE and proper body mechanics for progressing towards return to work.    Time 8    Period Weeks    Status Deferred   pt reports job is bringing her back on light duty     [place] #5   Title Pt will improve typing to 40 wpm net speed for return to work    Time 8    Period Weeks    Status On-going   24 wpm net speed 07/29/21                  Plan - 07/29/21 1028     Clinical Impression Statement Pt has met 6/6 STGs and 2/5 LTGs. Progressing towards unmet goals. Continue to  work towards unmet goals.    OT Occupational Profile and History Problem Focused Assessment - Including review of records relating to presenting problem    Occupational performance deficits (Please refer to evaluation for details): IADL's;ADL's;Work    Body Structure / Function / Physical Skills ADL;Decreased knowledge of use of DME;Strength;Dexterity;UE functional use;IADL;Vision;Sensation;Flexibility;FMC;Coordination    Cognitive Skills Memory    Rehab  Potential Good    Clinical Decision Making Limited treatment options, no task modification necessary    Comorbidities Affecting Occupational Performance: None    Modification or Assistance to Complete Evaluation  No modification of tasks or assist necessary to complete eval    OT Frequency 2x / week    OT Duration 8 weeks   may finish prior depending on progress   OT Treatment/Interventions Self-care/ADL training;Fluidtherapy;DME and/or AE instruction;Therapeutic activities;Therapeutic exercise;Cognitive remediation/compensation;Visual/perceptual remediation/compensation;Functional Mobility Training;Neuromuscular education;Energy conservation;Patient/family education    Plan arm bike, continue towards unmet goals.    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

## 2021-07-29 NOTE — Patient Instructions (Signed)
Memory Compensation Strategies  Use "WARM" strategy. W= write it down A=  associate it R=  repeat it M=  make a mental picture  You can keep a Memory Notebook. Use a 3-ring notebook with sections for the following:  calendar, important names and phone numbers, medications, doctors' names/phone numbers, "to do list"/reminders, and a section to journal what you did each day  Use a calendar to write appointments down.  Write yourself a schedule for the day.  This can be placed on the calendar or in a separate section of the Memory Notebook.  Keeping a regular schedule can help memory.  Use medication organizer with sections for each day or morning/evening pills  You may need help loading it  Keep a basket, or pegboard by the door.   Place items that you need to take out with you in the basket or on the pegboard.  You may also want to include a message board for reminders.  Use sticky notes. Place sticky notes with reminders in a place where the task is performed.  For example:  "turn off the stove" placed by the stove, "lock the door" placed on the door at eye level, "take your medications" on the bathroom mirror or by the place where you normally take your medications  Use alarms/timers.  Use while cooking to remind yourself to check on food or as a reminder to take your medicine, or as a reminder to make a call, or as a reminder to perform another task, etc.  Use a voice recorder app or small tape recorder to record important information and notes for yourself. Go back at the end of the day and listen to these.  

## 2021-08-02 ENCOUNTER — Ambulatory Visit: Payer: BC Managed Care – PPO | Admitting: Occupational Therapy

## 2021-08-02 ENCOUNTER — Ambulatory Visit: Payer: BC Managed Care – PPO | Admitting: Physical Therapy

## 2021-08-06 ENCOUNTER — Ambulatory Visit: Payer: BC Managed Care – PPO | Attending: Physician Assistant | Admitting: Occupational Therapy

## 2021-08-06 ENCOUNTER — Other Ambulatory Visit: Payer: Self-pay

## 2021-08-06 ENCOUNTER — Encounter: Payer: Self-pay | Admitting: Occupational Therapy

## 2021-08-06 DIAGNOSIS — R278 Other lack of coordination: Secondary | ICD-10-CM | POA: Diagnosis present

## 2021-08-06 DIAGNOSIS — M6281 Muscle weakness (generalized): Secondary | ICD-10-CM | POA: Diagnosis not present

## 2021-08-06 DIAGNOSIS — I69852 Hemiplegia and hemiparesis following other cerebrovascular disease affecting left dominant side: Secondary | ICD-10-CM | POA: Insufficient documentation

## 2021-08-06 DIAGNOSIS — R41842 Visuospatial deficit: Secondary | ICD-10-CM | POA: Diagnosis present

## 2021-08-06 DIAGNOSIS — R2681 Unsteadiness on feet: Secondary | ICD-10-CM | POA: Insufficient documentation

## 2021-08-06 DIAGNOSIS — R2689 Other abnormalities of gait and mobility: Secondary | ICD-10-CM | POA: Diagnosis present

## 2021-08-06 NOTE — Therapy (Signed)
[facility] [address] [place], [place], [address] Phone: [phone]   Fax:  [phone]  Occupational Therapy Treatment  Patient Details  Name: [name] Costa MRN: [id number] Date of Birth: 01-27-1981 Referring Provider (OT): F/U Dr. Leeroy Cha   Encounter Date: 08/06/2021   OT End of Session - 08/06/21 1018     Visit Number 6    Number of Visits 17   may d/c early based on progress   Date for OT Re-Evaluation 08/29/21    Authorization Type BCBS    Authorization Time Period 100% OOP met VL:30 OT    Authorization - Number of Visits 30    OT Start Time 1017    OT Stop Time 1056    OT Time Calculation (min) 39 min    Activity Tolerance Patient tolerated treatment well    Behavior During Therapy WFL for tasks assessed/performed             Past Medical History:  Diagnosis Date   Bladder infection    Trichomonas     Past Surgical History:  Procedure Laterality Date   BUBBLE STUDY  06/06/2021   Procedure: BUBBLE STUDY;  Surgeon: Donato Heinz, MD;  Location: [facility];  Service: Cardiovascular;;   CHOLECYSTECTOMY     CHOLECYSTECTOMY     TEE WITHOUT CARDIOVERSION N/A 06/06/2021   Procedure: TRANSESOPHAGEAL ECHOCARDIOGRAM (TEE);  Surgeon: Donato Heinz, MD;  Location: [facility];  Service: Cardiovascular;  Laterality: N/A;   TUBAL LIGATION Bilateral 04/13/2013   Procedure: POST PARTUM TUBAL LIGATION;  Surgeon: Melina Schools, MD;  Location: [facility];  Service: Gynecology;  Laterality: Bilateral;    There were no vitals filed for this visit.   Subjective Assessment - 08/06/21 1017     Subjective  I banged my arm into the door today.    Pertinent History Anxiety    Limitations Fall Risk    Patient Stated Goals "get back/close to 100% as possible"    Currently in Pain? No/denies    Pain Score 0-No pain                          OT Treatments/Exercises (OP) -  08/06/21 1024       ADLs   Work 29 wpm net speed typing test today.      Hand Exercises   Other Hand Exercises golf balls in LUE for in hand manipulation      Fine Motor Coordination (Hand/Wrist)   Fine Motor Coordination Small Pegboard;Flipping cards    Small Pegboard focusing on in hand manipulation with LUE for coordination    Flipping cards shuffling cards - bmanual coordination    Handwriting practicing lines, looks and infinity, distal finger control worksheet                      OT Short Term Goals - 07/29/21 1019       OT SHORT TERM GOAL #1   Title Pt will be independent with HEP    Baseline grip strength, coordination needed LUE    Time 4    Period Weeks    Status Achieved    Target Date 08/01/21      OT SHORT TERM GOAL #2   Title Pt will increase grip strength in LUE to 35 lbs or greater to increase strength and functional use of LUE, dominant hand.    Baseline L 28.6, R 70.3  Time 4    Period Weeks    Status Achieved   LUE 60.6 lbs 07/29/21     OT SHORT TERM GOAL #3   Title Pt will verbalize understanding of memory compensatory strategies    Time 4    Period Weeks    Status Achieved      OT SHORT TERM GOAL #4   Title Pt will increase typing speed to 20 wpm net speed or greater for progressing towards proficiency with typing for return to work    Baseline 11 wpm net speed    Time 4    Period Weeks    Status Achieved   07/29/21 Typing Test 24 wpm net speed     OT SHORT TERM GOAL #5   Title Pt will legibily write 2 sentences with 75% of prior level of handwriting    Baseline 25% prior level    Time 4    Period Weeks    Status Achieved   75% of PLOF prior to CVA 07/29/21     OT SHORT TERM GOAL #6   Title Pt will increase coordination in LUE by completing 9 hole peg test in 22 seconds or less.    Baseline R 19.21s, L 26.44s    Time 4    Period Weeks    Status Achieved   LUE 18.81s 07/29/21              OT Long Term Goals - 07/29/21  1020       OT LONG TERM GOAL #1   Title Pt will complete discharge FOTO with score of 73% or greater.    Baseline 61% at eval    Time 8    Period Weeks    Status New      OT LONG TERM GOAL #2   Title Pt will be independent with updated HEPs    Time 8    Period Weeks    Status New      OT LONG TERM GOAL #3   Title Pt will increase grip strength in LUE to 40 lbs or greater for increasing functional use of dominant hand.    Baseline R 70.3, L 28.6    Time 8    Period Weeks    Status Achieved   60.6 lbs with LUE 07/29/21     OT LONG TERM GOAL #4   Title Pt will demonstrate ability to manage a crate of 20 lbs or greater and picking up from floor to counter height with BUE and proper body mechanics for progressing towards return to work.    Time 8    Period Weeks    Status Deferred   pt reports job is bringing her back on light duty     [place] #5   Title Pt will improve typing to 40 wpm net speed for return to work    Time 8    Period Weeks    Status On-going   24 wpm net speed 07/29/21                  Plan - 08/06/21 1053     Clinical Impression Statement Pt is progressing towards goals. Continues to work on LUE coordination and typing speed    OT Occupational Profile and History Problem Focused Assessment - Including review of records relating to presenting problem    Occupational performance deficits (Please refer to evaluation for details): IADL's;ADL's;Work    Body Structure / Function /  Physical Skills ADL;Decreased knowledge of use of DME;Strength;Dexterity;UE functional use;IADL;Vision;Sensation;Flexibility;FMC;Coordination    Cognitive Skills Memory    Rehab Potential Good    Clinical Decision Making Limited treatment options, no task modification necessary    Comorbidities Affecting Occupational Performance: None    Modification or Assistance to Complete Evaluation  No modification of tasks or assist necessary to complete eval    OT Frequency 2x  / week    OT Duration 8 weeks   may finish prior depending on progress   OT Treatment/Interventions Self-care/ADL training;Fluidtherapy;DME and/or AE instruction;Therapeutic activities;Therapeutic exercise;Cognitive remediation/compensation;Visual/perceptual remediation/compensation;Functional Mobility Training;Neuromuscular education;Energy conservation;Patient/family education    Plan arm bike, continue towards unmet goals.    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 [place], [place], [address] Phone: [phone]   Fax:  [phone]  Name: [name] Costa MRN: [id number] Date of Birth: 11-11-1981

## 2021-08-08 ENCOUNTER — Other Ambulatory Visit: Payer: Self-pay

## 2021-08-08 ENCOUNTER — Encounter
Payer: BC Managed Care – PPO | Attending: Physical Medicine and Rehabilitation | Admitting: Physical Medicine and Rehabilitation

## 2021-08-08 VITALS — BP 122/80 | HR 70 | Temp 98.8°F | Ht 71.0 in | Wt 214.0 lb

## 2021-08-08 DIAGNOSIS — I639 Cerebral infarction, unspecified: Secondary | ICD-10-CM | POA: Insufficient documentation

## 2021-08-08 DIAGNOSIS — I10 Essential (primary) hypertension: Secondary | ICD-10-CM | POA: Insufficient documentation

## 2021-08-08 MED ORDER — AMLODIPINE BESYLATE 5 MG PO TABS: 5.0000 mg | ORAL_TABLET | Freq: Every day | ORAL | 11 refills | Status: AC

## 2021-08-08 NOTE — Patient Instructions (Signed)
HTN: -BP is 122/80 today.  -Advised checking BP daily at home and logging results to bring into follow-up appointment with her PCP and myself. -Reviewed BP meds today.  -Advised regarding healthy foods that can help lower blood pressure and provided with a list: 1) citrus foods- high in vitamins and minerals 2) salmon and other fatty fish - reduces inflammation and oxylipins 3) swiss chard (leafy green)- high level of nitrates 4) pumpkin seeds- one of the best natural sources of magnesium 5) Beans and lentils- high in fiber, magnesium, and potassium 6) Berries- high in flavonoids 7) Amaranth (whole grain, can be cooked similarly to rice and oats)- high in magnesium and fiber 8) Pistachios- even more effective at reducing BP than other nuts 9) Carrots- high in phenolic compounds that relax blood vessels and reduce inflammation 10) Celery- contain phthalides that relax tissues of arterial walls 11) Tomatoes- can also improve cholesterol and reduce risk of heart disease 12) Broccoli- good source of magnesium, calcium, and potassium 13) Greek yogurt: high in potassium and calcium 14) Herbs and spices: Celery seed, cilantro, saffron, lemongrass, black cumin, ginseng, cinnamon, cardamom, sweet basil, and ginger 15) Chia and flax seeds- also help to lower cholesterol and blood sugar 16) Beets- high levels of nitrates that relax blood vessels  17) spinach and bananas- high in potassium  -Provided lise of supplements that can help with hypertension:  1) magnesium: one high quality brand is Bioptemizers since it contains all 7 types of magnesium, otherwise over the counter magnesium gluconate 400mg  is a good option 2) B vitamins 3) vitamin D 4) potassium 5) CoQ10 6) L-arginine 7) Vitamin C 8) Beetroot -Educated that goal BP is 120/80. -Made goal to incorporate some of the above foods into diet.

## 2021-08-08 NOTE — Progress Notes (Signed)
Subjective:    Patient ID: [name] Costa, female    DOB: 1981-09-21, 40 y.o.   MRN: [id number]  HPI' [name] Costa is a 40 year old who presents for hospital follow-up after stroke.   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 Has not done any long distance driving.  -She manages accounts for janitorial practicies.   Pain Inventory Average Pain 4 Pain Right Now 0 My pain is intermittent and aching  LOCATION OF PAIN  shoulder, back, leg  BOWEL Number of stools per week: 7 Oral laxative use No  Type of laxative . Enema or suppository use No  History of colostomy No  Incontinent No   BLADDER Normal In and out cath, frequency na Able to self cath  na Bladder incontinence No  Frequent urination No  Leakage with coughing No  Difficulty starting stream No  Incomplete bladder emptying No    Mobility walk without assistance ability to climb steps?  yes do you drive?  yes  Function Do you have any goals in this area?  no  Neuro/Psych weakness depression suicidal thoughts  Prior Studies HFU  Physicians involved in your care HFU   No family history on file. Social History   Socioeconomic History   Marital status: Divorced    Spouse name: Not on file   Number of children: Not on file   Years of education: Not on file   Highest education level: Not on file  Occupational History   Not on file  Tobacco Use   Smoking status: Never   Smokeless tobacco: Never  Substance and Sexual Activity   Alcohol use: No   Drug use: No   Sexual activity: Not on file  Other Topics Concern   Not on file  Social History Narrative   Not on file   Social Determinants of Health   Financial Resource Strain: Not on file  Food Insecurity: Not on file   Transportation Needs: Not on file  Physical Activity: Not on file  Stress: Not on file  Social Connections: Not on file   Past Surgical History:  Procedure Laterality Date   BUBBLE STUDY  06/06/2021   Procedure: BUBBLE STUDY;  Surgeon: [name], MD;  Location: [facility];  Service: Cardiovascular;;   CHOLECYSTECTOMY     CHOLECYSTECTOMY     TEE WITHOUT CARDIOVERSION N/A 06/06/2021   Procedure: TRANSESOPHAGEAL ECHOCARDIOGRAM (TEE);  Surgeon: [name], MD;  Location: [facility];  Service: Cardiovascular;  Laterality: N/A;   TUBAL LIGATION Bilateral 04/13/2013   Procedure: POST PARTUM TUBAL LIGATION;  Surgeon: Bing Plume, MD;  Location: [facility];  Service: Gynecology;  Laterality: Bilateral;   Past Medical History:  Diagnosis Date   Bladder infection    Trichomonas    BP 122/80   Pulse 70   Temp 98.8 F (37.1 C) (Oral)   Ht 5\' 11"  (1.803 m)   Wt 214 lb (97.1 kg)   SpO2 98%   BMI 29.85 kg/m   Opioid Risk Score:   Fall Risk Score:  `1  Depression screen PHQ 2/9  Depression screen PHQ 2/9 08/08/2021  Decreased Interest 1  Down, Depressed, Hopeless 1  PHQ - 2 Score 2  Altered sleeping 2  Tired, decreased energy 1  Change in appetite 0  Feeling bad or failure about yourself  1  Trouble concentrating 0  Moving slowly or fidgety/restless 0  Suicidal thoughts 1  PHQ-9 Score 7  Difficult doing work/chores Somewhat difficult      Review of Systems  Neurological:  Positive for weakness.  Psychiatric/Behavioral:  Positive for dysphoric mood and suicidal ideas.   All other systems reviewed and are negative.     Objective:   Physical Exam Gen: no distress, normal appearing HEENT: oral mucosa pink and moist, NCAT Cardio: Reg rate Chest: normal effort, normal rate of breathing Abd: soft, non-distended Ext: no edema Psych: pleasant, normal affect Skin: intact Neuro: Alert and oriented  Musculoskeletal: 5/5 strength throughout      Assessment & Plan:  [name] Costa is a 40 year old woman who presents for hospita follow-up after CVA.   1) Right basal ganglia infarct -has follow-up with neurology -blood pressure looks great -discussed plan for return to work after neurology appointment.   2) HTN: -BP is 122/80 today.  -Advised checking BP daily at home and logging results to bring into follow-up appointment with her PCP and myself. -Reviewed BP meds today.  -Advised regarding healthy foods that can help lower blood pressure and provided with a list: 1) citrus foods- high in vitamins and minerals 2) salmon and other fatty fish - reduces inflammation and oxylipins 3) swiss chard (leafy green)- high level of nitrates 4) pumpkin seeds- one of the best natural sources of magnesium 5) Beans and lentils- high in fiber, magnesium, and potassium 6) Berries- high in flavonoids 7) Amaranth (whole grain, can be cooked similarly to rice and oats)- high in magnesium and fiber 8) Pistachios- even more effective at reducing BP than other nuts 9) Carrots- high in phenolic compounds that relax blood vessels and reduce inflammation 10) Celery- contain phthalides that relax tissues of arterial walls 11) Tomatoes- can also improve cholesterol and reduce risk of heart disease 12) Broccoli- good source of magnesium, calcium, and potassium 13) Greek yogurt: high in potassium and calcium 14) Herbs and spices: Celery seed, cilantro, saffron, lemongrass, black cumin, ginseng, cinnamon, cardamom, sweet basil, and ginger 15) Chia and flax seeds- also help to lower cholesterol and blood sugar 16) Beets- high levels of nitrates that relax blood vessels  17) spinach and bananas- high in potassium  -Provided lise of supplements that can help with hypertension:  1) magnesium: one high quality brand is Bioptemizers since it contains all 7 types of magnesium, otherwise over the counter magnesium gluconate 400mg  is a good option 2) B vitamins 3)  vitamin D 4) potassium 5) CoQ10 6) L-arginine 7) Vitamin C 8) Beetroot -Educated that goal BP is 120/80. -Made goal to incorporate some of the above foods into diet.

## 2021-08-09 ENCOUNTER — Ambulatory Visit: Payer: BC Managed Care – PPO | Admitting: Occupational Therapy

## 2021-08-09 ENCOUNTER — Encounter: Payer: Self-pay | Admitting: Occupational Therapy

## 2021-08-09 DIAGNOSIS — M6281 Muscle weakness (generalized): Secondary | ICD-10-CM

## 2021-08-09 DIAGNOSIS — I69852 Hemiplegia and hemiparesis following other cerebrovascular disease affecting left dominant side: Secondary | ICD-10-CM

## 2021-08-09 DIAGNOSIS — R41842 Visuospatial deficit: Secondary | ICD-10-CM

## 2021-08-09 DIAGNOSIS — R2681 Unsteadiness on feet: Secondary | ICD-10-CM

## 2021-08-09 DIAGNOSIS — R278 Other lack of coordination: Secondary | ICD-10-CM

## 2021-08-09 NOTE — Therapy (Signed)
[facility] [address] [place], [place], [address] Phone: [phone]   Fax:  [phone]  Occupational Therapy Treatment & Discharge  Patient Details  Name: [name] Costa MRN: [id number] Date of Birth: 07-18-1981 Referring Provider (OT): F/U Dr. Leeroy Cha   Encounter Date: 08/09/2021   OT End of Session - 08/09/21 1017     Visit Number 7    Number of Visits 91   may d/c early based on progress   Date for OT Re-Evaluation 08/29/21    Authorization Type BCBS    Authorization Time Period 100% OOP met VL:30 OT    Authorization - Number of Visits 30    OT Start Time 1016    OT Stop Time 1036   d/c session   OT Time Calculation (min) 20 min    Activity Tolerance Patient tolerated treatment well    Behavior During Therapy [facility] for tasks assessed/performed             Past Medical History:  Diagnosis Date   Bladder infection    Trichomonas     Past Surgical History:  Procedure Laterality Date   BUBBLE STUDY  06/06/2021   Procedure: BUBBLE STUDY;  Surgeon: Donato Heinz, MD;  Location: [facility];  Service: Cardiovascular;;   CHOLECYSTECTOMY     CHOLECYSTECTOMY     TEE WITHOUT CARDIOVERSION N/A 06/06/2021   Procedure: TRANSESOPHAGEAL ECHOCARDIOGRAM (TEE);  Surgeon: Donato Heinz, MD;  Location: [facility];  Service: Cardiovascular;  Laterality: N/A;   TUBAL LIGATION Bilateral 04/13/2013   Procedure: POST PARTUM TUBAL LIGATION;  Surgeon: Melina Schools, MD;  Location: [facility];  Service: Gynecology;  Laterality: Bilateral;    There were no vitals filed for this visit.   Subjective Assessment - 08/09/21 1016     Subjective  Yesterday went well - cleared to drive anywhere, lowering my medications and doing good    Pertinent History Anxiety    Limitations Fall Risk    Patient Stated Goals "get back/close to 100% as possible"    Currently in Pain? No/denies    Pain Score 0-No pain               OCCUPATIONAL THERAPY DISCHARGE SUMMARY  Visits from Start of Care: 7  Current functional level related to goals / functional outcomes: Pt has improved with overall LUE strength and coordination. Pt is back to driving, ADLs and IADLs with independence and is planning on returning to work in the next couple of weeks.   Remaining deficits: Continues with some deficits with LUE and residual weakness   Education / Equipment: HEPs, theraputty,theraband   Patient agrees to discharge. Patient goals were partially met. Patient is being discharged due to being pleased with the current functional level.Marland Kitchen    [place] Pegs with tweezers with LUE with good coordination and completing in good time with no drops.                    OT Education - 08/09/21 1036     Education Details upgraded to green theraputty, education on discharge instructions    Person(s) Educated Patient    Methods Explanation    Comprehension Verbalized understanding              OT Short Term Goals - 07/29/21 1019       OT SHORT TERM GOAL #1   Title Pt will be independent with HEP    Baseline grip strength, coordination  needed LUE    Time 4    Period Weeks    Status Achieved    Target Date 08/01/21      OT SHORT TERM GOAL #2   Title Pt will increase grip strength in LUE to 35 lbs or greater to increase strength and functional use of LUE, dominant hand.    Baseline L 28.6, R 70.3    Time 4    Period Weeks    Status Achieved   LUE 60.6 lbs 07/29/21     OT SHORT TERM GOAL #3   Title Pt will verbalize understanding of memory compensatory strategies    Time 4    Period Weeks    Status Achieved      OT SHORT TERM GOAL #4   Title Pt will increase typing speed to 20 wpm net speed or greater for progressing towards proficiency with typing for return to work    Baseline 11 wpm net speed    Time 4    Period Weeks    Status Achieved   07/29/21 Typing Test 24 wpm net speed      OT SHORT TERM GOAL #5   Title Pt will legibily write 2 sentences with 75% of prior level of handwriting    Baseline 25% prior level    Time 4    Period Weeks    Status Achieved   75% of PLOF prior to CVA 07/29/21     OT SHORT TERM GOAL #6   Title Pt will increase coordination in LUE by completing 9 hole peg test in 22 seconds or less.    Baseline R 19.21s, L 26.44s    Time 4    Period Weeks    Status Achieved   LUE 18.81s 07/29/21              OT Long Term Goals - 08/09/21 1020       OT LONG TERM GOAL #1   Title Pt will complete discharge FOTO with score of 73% or greater.    Baseline 61% at eval    Time 8    Period Weeks    Status Not Met   67% on discharge     OT LONG TERM GOAL #2   Title Pt will be independent with updated HEPs    Time 8    Period Weeks    Status Achieved      OT LONG TERM GOAL #3   Title Pt will increase grip strength in LUE to 40 lbs or greater for increasing functional use of dominant hand.    Baseline R 70.3, L 28.6    Time 8    Period Weeks    Status Achieved   60.6 lbs with LUE 07/29/21     OT LONG TERM GOAL #4   Title Pt will demonstrate ability to manage a crate of 20 lbs or greater and picking up from floor to counter height with BUE and proper body mechanics for progressing towards return to work.    Time 8    Period Weeks    Status Deferred   pt reports job is bringing her back on light duty     [place] #5   Title Pt will improve typing to 40 wpm net speed for return to work    Time 8    Period Weeks    Status Not Met   08/09/21 - 28 wpm  Plan - 08/09/21 1026     Clinical Impression Statement Pt has progressed well towards goals. Pt has imrpoved with overall LUE strength and coordination. Pt continues to have some residual weakness. Pt is discharging from OT today.    OT Occupational Profile and History Problem Focused Assessment - Including review of records relating to presenting problem     Occupational performance deficits (Please refer to evaluation for details): IADL's;ADL's;Work    Body Structure / Function / Physical Skills ADL;Decreased knowledge of use of DME;Strength;Dexterity;UE functional use;IADL;Vision;Sensation;Flexibility;FMC;Coordination    Cognitive Skills Memory    Rehab Potential Good    Clinical Decision Making Limited treatment options, no task modification necessary    Comorbidities Affecting Occupational Performance: None    Modification or Assistance to Complete Evaluation  No modification of tasks or assist necessary to complete eval    OT Frequency 2x / week    OT Duration 8 weeks   may finish prior depending on progress   OT Treatment/Interventions Self-care/ADL training;Fluidtherapy;DME and/or AE instruction;Therapeutic activities;Therapeutic exercise;Cognitive remediation/compensation;Visual/perceptual remediation/compensation;Functional Mobility Training;Neuromuscular education;Energy conservation;Patient/family education    Plan OT discharge    Consulted and Agree with Plan of Care Patient             Patient will benefit from skilled therapeutic intervention in order to improve the following deficits and impairments:   Body Structure / Function / Physical Skills: ADL, Decreased knowledge of use of DME, Strength, Dexterity, UE functional use, IADL, Vision, Sensation, Flexibility, FMC, Coordination Cognitive Skills: Memory     Visit Diagnosis: Muscle weakness (generalized)  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 [place], [place], [address] Phone: [phone]   Fax:  [phone]  Name: [name] Costa MRN: [id number] Date of Birth: Mariany 14, 1982

## 2021-08-12 ENCOUNTER — Ambulatory Visit: Payer: BC Managed Care – PPO | Admitting: Occupational Therapy

## 2021-08-15 ENCOUNTER — Ambulatory Visit: Payer: BC Managed Care – PPO | Admitting: Occupational Therapy

## 2021-08-19 ENCOUNTER — Ambulatory Visit: Payer: BC Managed Care – PPO | Admitting: Occupational Therapy

## 2021-08-21 ENCOUNTER — Encounter: Payer: BC Managed Care – PPO | Admitting: Occupational Therapy

## 2021-09-17 ENCOUNTER — Other Ambulatory Visit: Payer: Self-pay

## 2021-09-17 ENCOUNTER — Encounter: Payer: Self-pay | Admitting: Neurology

## 2021-09-17 ENCOUNTER — Ambulatory Visit (INDEPENDENT_AMBULATORY_CARE_PROVIDER_SITE_OTHER): Payer: BC Managed Care – PPO | Admitting: Neurology

## 2021-09-17 VITALS — BP 138/82 | HR 75 | Ht 71.0 in | Wt 220.0 lb

## 2021-09-17 DIAGNOSIS — R29810 Facial weakness: Secondary | ICD-10-CM

## 2021-09-17 DIAGNOSIS — I639 Cerebral infarction, unspecified: Secondary | ICD-10-CM | POA: Diagnosis not present

## 2021-09-17 MED ORDER — ATORVASTATIN CALCIUM 40 MG PO TABS: 40.0000 mg | ORAL_TABLET | Freq: Every day | ORAL | 0 refills | Status: DC

## 2021-09-17 NOTE — Patient Outreach (Signed)
[facility]) Care Management  09/17/2021  [name] Costa [birth year] [id number]   Outreach to patient by Dr. Pearlean Brownie on 09/17/21 to obtain mRS was successfully completed. MRS= 2  Thank you, Vanice [facility] Care Management Assistant

## 2021-09-17 NOTE — Progress Notes (Signed)
[facility] [address] [place]. [place] [address] [phone]       OFFICE FOLLOW-UP NOTE  [name] Date of Birth:  [birth year] Medical Record Number:  [id number]   HPI: [name] Costa is a 40 year old African-American lady seen today for initial office follow-up visit following consultation for stroke in July 2022.  History is obtained from the patient and review of electronic medical records and I personally reviewed available imaging films in PACS.[name] Costa is a 40 y.o. left handed female with PMH significant for anxiety who went to bed at 2230 on 06/03/21 and woke up at 0430 on 06/04/21 and mouth felt weird and had trouble walking. Stumbled while walking to the bathroom and noted facial droop on the left. EMS called and she was brought in as a stroke code.  She was found to have significantly elevated systolic blood pressure greater than 200 on presentation and CT scan of the head without contrast was unremarkable but MRI scan showed mild diffusion restriction in the right lateral ventricle at striate distribution without any T2/flair correlate compatible with a large subcortical acute infarct.  2D echo showed normal ejection fraction without cardiac source of embolism.  LDL cholesterol was 105 mg percent.  Hemoglobin A1c was 5.4.  Urine drug screen was positive for marijuana.  TCD bubble study was negative for PFO however TEE was positive for small right to left shunt but did not show any other cardiac source of embolism.  Hypercoagulable panel lab was significant only for elevated IgM anticardiolipin antibody and titer of 108.  Patient denies any prior history of DVT, pulmonary embolism or recurrent miscarriages.  She was started on dual antiplatelet therapy aspirin and Plavix for 3 weeks to be followed by aspirin alone.  Patient states she has done well since discharge.  Her left-sided weakness has completely recovered back to normal.  She however still feels tired  and has poor stamina.  She has been back to work.  She is tolerating aspirin and Plavix well with only minor bruising.  She is on Lipitor and tolerating well without muscle aches and pains.  She is not had any repeat labs yet.  She has no new complaints.  Blood pressures well controlled today it is 138/82.  ROS:   14 system review of systems is positive for tingling, numbness, tiredness and decreased stamina and all other systems negative  PMH:  Past Medical History:  Diagnosis Date   Bladder infection    Trichomonas     Social History:  Social History   Socioeconomic History   Marital status: Divorced    Spouse name: Not on file   Number of children: Not on file   Years of education: Not on file   Highest education level: Not on file  Occupational History   Not on file  Tobacco Use   Smoking status: Never   Smokeless tobacco: Never  Substance and Sexual Activity   Alcohol use: No   Drug use: No   Sexual activity: Not on file  Other Topics Concern   Not on file  Social History Narrative   Not on file   Social Determinants of Health   Financial Resource Strain: Not on file  Food Insecurity: Not on file  Transportation Needs: Not on file  Physical Activity: Not on file  Stress: Not on file  Social Connections: Not on file  Intimate Partner Violence: Not on file    Medications:   Current Outpatient Medications on  File Prior to Visit  Medication Sig Dispense Refill   acetaminophen (TYLENOL) 325 MG tablet Take 2 tablets (650 mg total) by mouth every 4 (four) hours as needed for mild pain (or temp > 37.5 C (99.5 F)). 30 tablet 0   amLODipine (NORVASC) 5 MG tablet Take 1 tablet (5 mg total) by mouth daily. 30 tablet 11   aspirin 81 MG chewable tablet Chew 1 tablet (81 mg total) by mouth daily. 90 tablet 0   B Complex-C (B-COMPLEX WITH VITAMIN C) tablet Take 1 tablet by mouth daily. 30 tablet 0   magnesium gluconate (MAGONATE) 500 MG tablet Take 1 tablet (500 mg total) by  mouth daily. 30 tablet 0   Vitamin D, Ergocalciferol, (DRISDOL) 1.25 MG (50000 UNIT) CAPS capsule Take 1 capsule (50,000 Units total) by mouth every Saturday. 5 capsule 0   No current facility-administered medications on file prior to visit.    Allergies:   Allergies  Allergen Reactions   Latex Rash    Physical Exam General: Mildly obese middle-aged African-American lady, seated, in no evident distress Head: head normocephalic and atraumatic.  Neck: supple with no carotid or supraclavicular bruits Cardiovascular: regular rate and rhythm, no murmurs Musculoskeletal: no deformity Skin:  no rash/petichiae Vascular:  Normal pulses all extremities Vitals:   09/17/21 0855  BP: 138/82  Pulse: 75   Neurologic Exam Mental Status: Awake and fully alert. Oriented to place and time. Recent and remote memory intact. Attention span, concentration and fund of knowledge appropriate. Mood and affect appropriate.  Cranial Nerves: Fundoscopic exam reveals sharp disc margins. Pupils equal, briskly reactive to light. Extraocular movements full without nystagmus. Visual fields full to confrontation. Hearing intact. Facial sensation intact. Face, tongue, palate moves normally and symmetrically.  Motor: Normal bulk and tone. Normal strength in all tested extremity muscles.  Diminished fine finger movements on the left.  Orbits right over left upper extremity.  Trace left grip weakness. Sensory.:  Subjective diminished touch pinprick left face.  Normal position vibration sensation. Coordination: Rapid alternating movements normal in all extremities. Finger-to-nose and heel-to-shin performed accurately bilaterally. Gait and Station: Arises from chair without difficulty. Stance is normal. Gait demonstrates normal stride length and balance . Able to heel, toe and tandem walk without difficulty.  Reflexes: 1+ and symmetric. Toes downgoing.   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  Vascular risk factors of hypertension, hyperlipidemia, mild obesity and small PFO noted on TEE but not on TCD bubble study and with a low ROPE score of 5 less likely to be significant.  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     PLAN: I had a long d/w patient about her recent cryptogenic stroke, risk for recurrent stroke/TIAs, personally independently reviewed imaging studies and stroke evaluation results and answered questions.she has a low ROPEscore of 5 with only 34% chance that her small PFO is the cause of the stroke hence we we will not pursue continue aspirin 81 mg daily  for secondary stroke prevention and stop Plavix now and maintain strict control of hypertension with blood pressure goal below 130/90, diabetes with hemoglobin A1c goal below 6.5% and lipids with LDL cholesterol goal below 70 mg/dL. I also advised the patient to eat a healthy diet with plenty of whole grains, cereals, fruits and vegetables, exercise regularly and maintain ideal body weight .repeat anticardiolipin antibodies as they  were positive in the acute phase.  Patient may return back to work without restrictions but I have advised her to place or date.  Followup in the future with me in 3 months or call earlier if necessary.Greater than 50% of time during this 25 minute visit was spent on counseling,explanation of diagnosis, planning of further management, discussion with patient and family and coordination of care Delia Heady, MD Note: This document was prepared with digital dictation and possible smart phrase technology. Any transcriptional errors that result from this process are unintentional

## 2021-09-17 NOTE — Patient Instructions (Signed)
I had a long d/w patient about her recent cryptogenic stroke, risk for recurrent stroke/TIAs, personally independently reviewed imaging studies and stroke evaluation results and answered questions.she has a low ROPEscore of 5 with only 34% chance that her small PFO is the cause of the stroke hence we we will not pursue continue aspirin 81 mg daily  for secondary stroke prevention and stop Plavix now and maintain strict control of hypertension with blood pressure goal below 130/90, diabetes with hemoglobin A1c goal below 6.5% and lipids with LDL cholesterol goal below 70 mg/dL. I also advised the patient to eat a healthy diet with plenty of whole grains, cereals, fruits and vegetables, exercise regularly and maintain ideal body weight .repeat anticardiolipin antibodies as they were positive in the acute phase.  Patient may return back to work without restrictions but I have advised her to place or date.  Followup in the future with me in 3 months or call earlier if necessary. Stroke Prevention Some medical conditions and behaviors are associated with a higher chance of having a stroke. You can help prevent a stroke by making nutrition, lifestyle, and other changes, including managing any medical conditions you may have. What nutrition changes can be made?  Eat healthy foods. You can do this by: Choosing foods high in fiber, such as fresh fruits and vegetables and whole grains. Eating at least 5 or more servings of fruits and vegetables a day. Try to fill half of your plate at each meal with fruits and vegetables. Choosing lean protein foods, such as lean cuts of meat, poultry without skin, fish, tofu, beans, and nuts. Eating low-fat dairy products. Avoiding foods that are high in salt (sodium). This can help lower blood pressure. Avoiding foods that have saturated fat, trans fat, and cholesterol. This can help prevent high cholesterol. Avoiding processed and premade foods. Follow your health care  provider's specific guidelines for losing weight, controlling high blood pressure (hypertension), lowering high cholesterol, and managing diabetes. These may include: Reducing your daily calorie intake. Limiting your daily sodium intake to 1,500 milligrams (mg). Using only healthy fats for cooking, such as olive oil, canola oil, or sunflower oil. Counting your daily carbohydrate intake. What lifestyle changes can be made? Maintain a healthy weight. Talk to your health care provider about your ideal weight. Get at least 30 minutes of moderate physical activity at least 5 days a week. Moderate activity includes brisk walking, biking, and swimming. Do not use any products that contain nicotine or tobacco, such as cigarettes and e-cigarettes. If you need help quitting, ask your health care provider. It may also be helpful to avoid exposure to secondhand smoke. Limit alcohol intake to no more than 1 drink a day for nonpregnant women and 2 drinks a day for men. One drink equals 12 oz of beer, 5 oz of wine, or 1 oz of hard liquor. Stop any illegal drug use. Avoid taking birth control pills. Talk to your health care provider about the risks of taking birth control pills if: You are over 32 years old. You smoke. You get migraines. You have ever had a blood clot. What other changes can be made? Manage your cholesterol levels. Eating a healthy diet is important for preventing high cholesterol. If cholesterol cannot be managed through diet alone, you may also need to take medicines. Take any prescribed medicines to control your cholesterol as told by your health care provider. Manage your diabetes. Eating a healthy diet and exercising regularly are important parts of managing  your blood sugar. If your blood sugar cannot be managed through diet and exercise, you may need to take medicines. Take any prescribed medicines to control your diabetes as told by your health care provider. Control your  hypertension. To reduce your risk of stroke, try to keep your blood pressure below 130/80. Eating a healthy diet and exercising regularly are an important part of controlling your blood pressure. If your blood pressure cannot be managed through diet and exercise, you may need to take medicines. Take any prescribed medicines to control hypertension as told by your health care provider. Ask your health care provider if you should monitor your blood pressure at home. Have your blood pressure checked every year, even if your blood pressure is normal. Blood pressure increases with age and some medical conditions. Get evaluated for sleep disorders (sleep apnea). Talk to your health care provider about getting a sleep evaluation if you snore a lot or have excessive sleepiness. Take over-the-counter and prescription medicines only as told by your health care provider. Aspirin or blood thinners (antiplatelets or anticoagulants) may be recommended to reduce your risk of forming blood clots that can lead to stroke. Make sure that any other medical conditions you have, such as atrial fibrillation or atherosclerosis, are managed. What are the warning signs of a stroke? The warning signs of a stroke can be easily remembered as BEFAST. B is for balance. Signs include: Dizziness. Loss of balance or coordination. Sudden trouble walking. E is for eyes. Signs include: A sudden change in vision. Trouble seeing. F is for face. Signs include: Sudden weakness or numbness of the face. The face or eyelid drooping to one side. A is for arms. Signs include: Sudden weakness or numbness of the arm, usually on one side of the body. S is for speech. Signs include: Trouble speaking (aphasia). Trouble understanding. T is for time. These symptoms may represent a serious problem that is an emergency. Do not wait to see if the symptoms will go away. Get medical help right away. Call your local emergency services (911 in the  [place] Do not drive yourself to the hospital. Other signs of stroke may include: A sudden, severe headache with no known cause. Nausea or vomiting. Seizure. Where to find more information For more information, visit: American Stroke Association: www.strokeassociation.org National Stroke Association: www.stroke.org Summary You can prevent a stroke by eating healthy, exercising, not smoking, limiting alcohol intake, and managing any medical conditions you may have. Do not use any products that contain nicotine or tobacco, such as cigarettes and e-cigarettes. If you need help quitting, ask your health care provider. It may also be helpful to avoid exposure to secondhand smoke. Remember BEFAST for warning signs of stroke. Get help right away if you or a loved one has any of these signs. This information is not intended to replace advice given to you by your health care provider. Make sure you discuss any questions you have with your health care provider. Document Revised: 10/30/2017 Document Reviewed: 12/23/2016 Elsevier Patient Education  2021 [employer].

## 2021-09-18 LAB — CARDIOLIPIN ANTIBODIES, IGG, IGM, IGA
Anticardiolipin IgA: <9 U/mL (ref 0–11)
Anticardiolipin IgG: <9 GPL U/mL (ref 0–14)
Anticardiolipin IgM: 54 [MPL'U]/mL — ABNORMAL HIGH (ref 0–12)

## 2021-09-19 ENCOUNTER — Telehealth: Payer: Self-pay | Admitting: *Deleted

## 2021-09-19 NOTE — Telephone Encounter (Signed)
I spoke to the patient and notified her of the lab results. °

## 2021-09-19 NOTE — Telephone Encounter (Signed)
-----   Message from Micki Riley, MD sent at 09/19/2021  4:41 PM EDT ----- Joneen Roach inform the patient that lab work for anticardiolipin antibodies shows improvement compared to few months ago.  Nothing to worry about ----- Message ----- From: Interface, [facility] Lab Results In Sent: 09/18/2021   4:36 PM EDT To: Micki Riley, MD

## 2021-09-26 ENCOUNTER — Telehealth: Payer: Self-pay | Admitting: Neurology

## 2021-09-26 NOTE — Telephone Encounter (Signed)
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  Transferred Pt to [employer].

## 2021-09-26 NOTE — Telephone Encounter (Signed)
[employer]) called, requesting FLMA paperwork that was submitted by our employee, Christia Brickell. II can fax forms over if needed. Can be faxed to [phone]

## 2021-10-01 ENCOUNTER — Telehealth: Payer: Self-pay | Admitting: *Deleted

## 2021-10-01 NOTE — Telephone Encounter (Signed)
I received pt [employer] form FMLA on 1031/22.

## 2021-10-10 NOTE — Telephone Encounter (Signed)
[employer] FMLA forms completed and faxed to [facility] [phone].

## 2022-01-19 ENCOUNTER — Telehealth: Payer: Self-pay | Admitting: Neurology

## 2022-01-19 NOTE — Telephone Encounter (Signed)
Dr. Pearlean Brownie has called out sick for tomorrow, so I sent the patient a mychart message letting her know to call the office so we can get her rescheduled.

## 2022-01-20 ENCOUNTER — Ambulatory Visit: Payer: BC Managed Care – PPO | Admitting: Neurology

## 2022-02-05 ENCOUNTER — Ambulatory Visit (INDEPENDENT_AMBULATORY_CARE_PROVIDER_SITE_OTHER): Payer: BC Managed Care – PPO | Admitting: Neurology

## 2022-02-05 ENCOUNTER — Encounter: Payer: Self-pay | Admitting: Neurology

## 2022-02-05 VITALS — BP 139/83 | HR 79 | Ht 71.0 in | Wt 224.0 lb

## 2022-02-05 DIAGNOSIS — I6990 Unspecified sequelae of unspecified cerebrovascular disease: Secondary | ICD-10-CM | POA: Diagnosis not present

## 2022-02-05 MED ORDER — ATORVASTATIN CALCIUM 40 MG PO TABS: 40.0000 mg | ORAL_TABLET | Freq: Every day | ORAL | 3 refills | Status: DC

## 2022-02-05 NOTE — Patient Instructions (Signed)
I had a long d/w patient  and her son about her recent cryptogenic stroke, risk for recurrent stroke/TIAs, personally independently reviewed imaging studies and stroke evaluation results and answered questions.  continue aspirin 81 mg daily  for secondary stroke prevention  and maintain strict control of hypertension with blood pressure goal below 130/90, diabetes with hemoglobin A1c goal below 6.5% and lipids with LDL cholesterol goal below 70 mg/dL.  She ran out of her Lipitor a month ago.  I gave her a refill and counseled her to call for refills.I also advised the patient to eat a healthy diet with plenty of whole grains, cereals, fruits and vegetables, exercise regularly and maintain ideal body weight .repeat anticardiolipin antibodies as they were positive in the acute phase.  Patient may return back to work without restrictions but I have advised her to place or date.  Followup in the future with me in 1 year or call earlier if necessary. ?

## 2022-02-05 NOTE — Progress Notes (Signed)
?[facility] ?[address] ?[place]. [place] [address] ?[phone] ? ?     OFFICE FOLLOW-UP NOTE ? ?[name] ?Date of Birth:  [birth year] ?Medical Record Number:  [id number]  ? ?HPI: Last visit 09/17/2021: [name] is a 41 year old African-American lady seen today for initial office follow-up visit following consultation for stroke in July 2022.  History is obtained from the patient and review of electronic medical records and I personally reviewed available imaging films in PACS.[name] Costa is a 41 y.o. left handed female with PMH significant for anxiety who went to bed at 2230 on 06/03/21 and woke up at 0430 on 06/04/21 and mouth felt weird and had trouble walking. Stumbled while walking to the bathroom and noted facial droop on the left. EMS called and she was brought in as a stroke code.  She was found to have significantly elevated systolic blood pressure greater than 200 on presentation and CT scan of the head without contrast was unremarkable but MRI scan showed mild diffusion restriction in the right lateral ventricle at striate distribution without any T2/flair correlate compatible with a large subcortical acute infarct.  2D echo showed normal ejection fraction without cardiac source of embolism.  LDL cholesterol was 105 mg percent.  Hemoglobin A1c was 5.4.  Urine drug screen was positive for marijuana.  TCD bubble study was negative for PFO however TEE was positive for small right to left shunt but did not show any other cardiac source of embolism.  Hypercoagulable panel lab was significant only for elevated IgM anticardiolipin antibody and titer of 108.  Patient denies any prior history of DVT, pulmonary embolism or recurrent miscarriages.  She was started on dual antiplatelet therapy aspirin and Plavix for 3 weeks to be followed by aspirin alone.  Patient states she has done well since discharge.  Her left-sided weakness has completely recovered back to normal.  She  however still feels tired and has poor stamina.  She has been back to work.  She is tolerating aspirin and Plavix well with only minor bruising.  She is on Lipitor and tolerating well without muscle aches and pains.  She is not had any repeat labs yet.  She has no new complaints.  Blood pressures well controlled today it is 138/82. ?Update 02/05/2022 : She returns for follow-up after last visit 4-1/2 months ago.  He states he continues to do well.  He has had no recurrent stroke or TIA symptoms.  She is tolerating aspirin well without bruising or bleeding.  She ran out of Lipitor a month ago but did not call for refill.  Blood pressure is well controlled at home.  He was tolerating Lipitor well without muscle aches or pains.  She is back to work full-time in fact.  She is working harder and longer hours and has more stress in her job now..  She complains of increased fatigability and tiredness. ?ROS:   ?14 system review of systems is positive for tingling, numbness, tiredness and decreased stamina and all other systems negative ? ?PMH:  ?Past Medical History:  ?Diagnosis Date  ? Bladder infection   ? Trichomonas   ? ? ?Social History:  ?Social History  ? ?Socioeconomic History  ? Marital status: Divorced  ?  Spouse name: Not on file  ? Number of children: Not on file  ? Years of education: Not on file  ? Highest education level: Not on file  ?Occupational History  ? Not on file  ?Tobacco Use  ?  Smoking status: Never  ? Smokeless tobacco: Never  ?Substance and Sexual Activity  ? Alcohol use: No  ? Drug use: No  ? Sexual activity: Not on file  ?Other Topics Concern  ? Not on file  ?Social History Narrative  ? Not on file  ? ?Social Determinants of Health  ? ?Financial Resource Strain: Not on file  ?Food Insecurity: Not on file  ?Transportation Needs: Not on file  ?Physical Activity: Not on file  ?Stress: Not on file  ?Social Connections: Not on file  ?Intimate Partner Violence: Not on file  ? ? ?Medications:   ?Current  Outpatient Medications on File Prior to Visit  ?Medication Sig Dispense Refill  ? acetaminophen (TYLENOL) 325 MG tablet Take 2 tablets (650 mg total) by mouth every 4 (four) hours as needed for mild pain (or temp > 37.5 C (99.5 F)). 30 tablet 0  ? amLODipine (NORVASC) 5 MG tablet Take 1 tablet (5 mg total) by mouth daily. 30 tablet 11  ? aspirin 81 MG chewable tablet Chew 1 tablet (81 mg total) by mouth daily. 90 tablet 0  ? ?No current facility-administered medications on file prior to visit.  ? ? ?Allergies:   ?Allergies  ?Allergen Reactions  ? Latex Rash  ? ? ?Physical Exam ?General: Mildly obese middle-aged African-American lady, seated, in no evident distress ?Head: head normocephalic and atraumatic.  ?Neck: supple with no carotid or supraclavicular bruits ?Cardiovascular: regular rate and rhythm, no murmurs ?Musculoskeletal: no deformity ?Skin:  no rash/petichiae ?Vascular:  Normal pulses all extremities ?Vitals:  ? 02/05/22 1529  ?BP: 139/83  ?Pulse: 79  ? ?Neurologic Exam ?Mental Status: Awake and fully alert. Oriented to place and time. Recent and remote memory intact. Attention span, concentration and fund of knowledge appropriate. Mood and affect appropriate.  ?Cranial Nerves: Fundoscopic exam reveals sharp disc margins. Pupils equal, briskly reactive to light. Extraocular movements full without nystagmus. Visual fields full to confrontation. Hearing intact. Facial sensation intact. Face, tongue, palate moves normally and symmetrically.  ?Motor: Normal bulk and tone. Normal strength in all tested extremity muscles.  Diminished fine finger movements on the left.  Orbits right over left upper extremity.  Trace left grip weakness. ?Sensory.:  Subjective diminished touch pinprick left face.  Normal position vibration sensation. ?Coordination: Rapid alternating movements normal in all extremities. Finger-to-nose and heel-to-shin performed accurately bilaterally. ?Gait and Station: Arises from chair without  difficulty. Stance is normal. Gait demonstrates normal stride length and balance . Able to heel, toe and tandem walk without difficulty.  ?Reflexes: 1+ and symmetric. Toes downgoing.  ? ?  ? ? 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  Vascular risk factors of hypertension, hyperlipidemia, mild obesity and small PFO noted on TEE but not on TCD bubble study and with a low ROPE score of 5 less likely to be significant.  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 ? ? ? ? ?PLAN: II had a long d/w patient  and her son about her recent cryptogenic stroke, risk for recurrent stroke/TIAs, personally independently reviewed imaging studies and stroke evaluation results and answered questions.  continue aspirin 81 mg daily  for secondary stroke prevention  and maintain strict control of hypertension with blood pressure goal below 130/90, diabetes with hemoglobin A1c goal below 6.5% and lipids with LDL cholesterol goal below 70 mg/dL.  She ran out of her Lipitor a month ago.  I gave her a  refill and counseled her to call for refills.I also advised the patient to eat a healthy diet with plenty of whole grains, cereals, fruits and vegetables, exercise regularly and maintain ideal body weight .repeat anticardiolipin antibodies as they were positive in the acute phase.  Patient may return back to work without restrictions but I have advised her to place or date.  Followup in the future with me in 1 year or call earlier if necessary.Greater than 50% of time during this 30 minute visit was spent on counseling,explanation of diagnosis, planning of further management, discussion with patient and family and coordination of care ?Antony Contras, MD ?Note: This document was prepared with digital dictation and possible smart phrase technology. Any transcriptional errors that result from this process are  unintentional ? ?

## 2022-04-02 ENCOUNTER — Encounter (HOSPITAL_COMMUNITY): Payer: Self-pay

## 2022-04-02 ENCOUNTER — Ambulatory Visit (HOSPITAL_COMMUNITY)
Admission: EM | Admit: 2022-04-02 | Discharge: 2022-04-02 | Disposition: A | Discharge status: Home / Self Care | Payer: BC Managed Care – PPO | Attending: Emergency Medicine | Admitting: Emergency Medicine

## 2022-04-02 DIAGNOSIS — G5602 Carpal tunnel syndrome, left upper limb: Secondary | ICD-10-CM

## 2022-04-02 MED ORDER — KETOROLAC TROMETHAMINE 30 MG/ML IJ SOLN
INTRAMUSCULAR | Status: AC
  Filled 2022-04-02: qty 1

## 2022-04-02 MED ORDER — NAPROXEN SODIUM 550 MG PO TABS: 550.0000 mg | ORAL_TABLET | Freq: Two times a day (BID) | ORAL | 0 refills | Status: DC

## 2022-04-02 MED ORDER — KETOROLAC TROMETHAMINE 30 MG/ML IJ SOLN
30.0000 mg | Freq: Once | INTRAMUSCULAR | Status: AC
  Administered 2022-04-02: 30 mg via INTRAMUSCULAR

## 2022-04-02 NOTE — ED Provider Notes (Signed)
?[facility] ? ? ? ?CSN: [id number] ?Arrival date & time: 04/02/22  8299 ? ? ?  ? ?History   ?Chief Complaint ?Chief Complaint  ?Patient presents with  ? Hand Pain  ? ? ?HPI ?[name] Costa is a 41 y.o. female.  ? ?Patient presents with left hand pain extending into the left wrist and forearm for 2 days.  Symptoms initially began as a cramping sensation but is now a constant throbbing.  Pain interfering with sleep.  Range of motion is intact but pain is elicited with all movement.  Denies numbness, tingling, prior injury or trauma.  Has attempted use of Tylenol which has been ineffective.  Patient endorses that she is a.  We will recommend to constantly uses hands.  Denies numbness or tingling.   ? ?Past Medical History:  ?Diagnosis Date  ? Bladder infection   ? Stroke (HCC) 06/04/2021  ? Trichomonas   ? ? ?Patient Active Problem List  ? Diagnosis Date Noted  ? Infarction of right basal ganglia (HCC) 06/07/2021  ? 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/04/2021  ? TOBACCO ABUSE 10/15/2009  ? ALLERGIC RHINITIS 10/15/2009  ? ASTHMA 10/15/2009  ? ? ?Past Surgical History:  ?Procedure Laterality Date  ? BUBBLE STUDY  06/06/2021  ? Procedure: BUBBLE STUDY;  Surgeon: [name], MD;  Location: [facility] ENDOSCOPY;  Service: Cardiovascular;;  ? CHOLECYSTECTOMY    ? CHOLECYSTECTOMY    ? TEE WITHOUT CARDIOVERSION N/A 06/06/2021  ? Procedure: TRANSESOPHAGEAL ECHOCARDIOGRAM (TEE);  Surgeon: [name], MD;  Location: [facility] ENDOSCOPY;  Service: Cardiovascular;  Laterality: N/A;  ? TUBAL LIGATION Bilateral 04/13/2013  ? Procedure: POST PARTUM TUBAL LIGATION;  Surgeon: Bing Plume, MD;  Location: [facility] ORS;  Service: Gynecology;  Laterality: Bilateral;  ? ? ?OB History   ? ? Gravida  ?5  ? Para  ?2  ? Term  ?2  ? Preterm  ?   ? AB  ?3  ? Living  ?2  ?  ? ? SAB  ?1  ? IAB  ?2  ? Ectopic  ?   ? Multiple  ?   ? Live Births  ?1  ?   ?  ?  ? ? ? ?Home Medications   ? ?Prior to Admission medications   ?Medication Sig Start  Date End Date Taking? Authorizing Provider  ?acetaminophen (TYLENOL) 325 MG tablet Take 2 tablets (650 mg total) by mouth every 4 (four) hours as needed for mild pain (or temp > 37.5 C (99.5 F)). 06/07/21   Arline Asp, NP  ?amLODipine (NORVASC) 5 MG tablet Take 1 tablet (5 mg total) by mouth daily. 08/08/21 08/08/22  Horton Chin, MD  ?aspirin 81 MG chewable tablet Chew 1 tablet (81 mg total) by mouth daily. 06/08/21   Arline Asp, NP  ?atorvastatin (LIPITOR) 40 MG tablet Take 1 tablet (40 mg total) by mouth daily. 02/05/22   Micki Riley, MD  ? ? ?Family History ?History reviewed. No pertinent family history. ? ?Social History ?Social History  ? ?Tobacco Use  ? Smoking status: Never  ? Smokeless tobacco: Never  ?Substance Use Topics  ? Alcohol use: No  ? Drug use: No  ? ? ? ?Allergies   ?Latex ? ? ?Review of Systems ?Review of Systems ?Defer to HPI  ? ? ?Physical Exam ?Triage Vital Signs ?ED Triage Vitals  ?Enc Vitals Group  ?   BP 04/02/22 0949 130/90  ?   Pulse Rate 04/02/22 0949 Marland Kitchen)  57  ?   Resp 04/02/22 0949 18  ?   Temp 04/02/22 0949 97.8 ?F (36.6 ?C)  ?   Temp Source 04/02/22 0949 Oral  ?   SpO2 04/02/22 0949 96 %  ?   Weight --   ?   Height --   ?   Head Circumference --   ?   Peak Flow --   ?   Pain Score 04/02/22 0946 6  ?   Pain Loc --   ?   Pain Edu? --   ?   Excl. in GC? --   ? ?No data found. ? ?Updated Vital Signs ?BP 130/90 (BP Location: Right Arm)   Pulse (!) 57   Temp 97.8 ?F (36.6 ?C) (Oral)   Resp 18   LMP  (LMP Unknown)   SpO2 96%  ? ?Visual Acuity ?Right Eye Distance:   ?Left Eye Distance:   ?Bilateral Distance:   ? ?Right Eye Near:   ?Left Eye Near:    ?Bilateral Near:    ? ?Physical Exam ?Constitutional:   ?   Appearance: Normal appearance.  ?HENT:  ?   Head: Normocephalic.  ?Eyes:  ?   Extraocular Movements: Extraocular movements intact.  ?Pulmonary:  ?   Effort: Pulmonary effort is normal.  ?Musculoskeletal:  ?   Comments: Tenderness along the radial aspect of the wrist  extending to the scaphoid bone on the palmar aspect, positive Phalen's and Tinel's sign, range of motion intact, sensation intact, 2+ radial pulse  ?Skin: ?   General: Skin is warm and dry.  ?Neurological:  ?   Mental Status: She is alert and oriented to person, place, and time. Mental status is at baseline.  ?Psychiatric:     ?   Mood and Affect: Mood normal.     ?   Behavior: Behavior normal.  ? ? ? ?UC Treatments / Results  ?Labs ?(all labs ordered are listed, but only abnormal results are displayed) ?Labs Reviewed - No data to display ? ?EKG ? ? ?Radiology ?No results found. ? ?Procedures ?Procedures (including critical care time) ? ?Medications Ordered in UC ?Medications - No data to display ? ?Initial Impression / Assessment and Plan / UC Course  ?I have reviewed the triage vital signs and the nursing notes. ? ?Pertinent labs & imaging results that were available during my care of the patient were reviewed by me and considered in my medical decision making (see chart for details). ? ?Carpal tunnel syndrome of left wrist ? ?Symptomology and presentation is consistent with carpal tunnel, most likely due to overuse from work, discussed with patient, Toradol injection given in office, prescribed naproxen twice daily for 5 days then as needed, left wrist brace placed prior to discharge, recommended heat over the affected area in 10 to 15-minute intervals, work note given, recommended follow-up with orthopedics if symptoms continue to persist, walking referral given ?Final Clinical Impressions(s) / UC Diagnoses  ? ?Final diagnoses:  ?None  ? ?Discharge Instructions   ?None ?  ? ?ED Prescriptions   ?None ?  ? ?PDMP not reviewed this encounter. ?  ?Valinda Hoar, NP ?04/02/22 1047 ? ?

## 2022-04-02 NOTE — ED Triage Notes (Signed)
Pt's stats left hand pain for the past two days. Pt states is painful to move. Pt states pain radiates to left forearm, Pt denies injury. Pt states she takes BP med at night.  ?

## 2022-04-02 NOTE — Discharge Instructions (Addendum)
Today you are being treated for carpal tunnel, inside your packet is more information on this condition, carpal tunnel with an inflammatory response therefore we will work to reduce this to help ease your pain ? ?You have been given an injection of Toradol here in the office which helps to reduce inflammation ? ?Starting tomorrow take naproxen twice daily for 5 days, then you may use as needed ? ?May wear wrist brace for additional support throughout the day ? ?May apply heat over the affected area in 10 to 15-minute intervals ? ?If symptoms continue to persist she may follow-up with orthopedics, information is listed below ?

## 2022-08-20 ENCOUNTER — Other Ambulatory Visit: Payer: Self-pay | Admitting: Physical Medicine and Rehabilitation

## 2022-09-04 ENCOUNTER — Other Ambulatory Visit: Payer: Self-pay | Admitting: Family Medicine

## 2022-09-04 DIAGNOSIS — Z1231 Encounter for screening mammogram for malignant neoplasm of breast: Secondary | ICD-10-CM

## 2022-09-18 ENCOUNTER — Emergency Department (HOSPITAL_BASED_OUTPATIENT_CLINIC_OR_DEPARTMENT_OTHER): Payer: BC Managed Care – PPO

## 2022-09-18 ENCOUNTER — Emergency Department (HOSPITAL_BASED_OUTPATIENT_CLINIC_OR_DEPARTMENT_OTHER): Payer: BC Managed Care – PPO | Admitting: Radiology

## 2022-09-18 ENCOUNTER — Encounter (HOSPITAL_BASED_OUTPATIENT_CLINIC_OR_DEPARTMENT_OTHER): Payer: Self-pay

## 2022-09-18 ENCOUNTER — Emergency Department (HOSPITAL_BASED_OUTPATIENT_CLINIC_OR_DEPARTMENT_OTHER)
Admission: EM | Admit: 2022-09-18 | Discharge: 2022-09-18 | Disposition: A | Discharge status: Home / Self Care | Payer: BC Managed Care – PPO | Attending: Emergency Medicine | Admitting: Emergency Medicine

## 2022-09-18 DIAGNOSIS — Z91040 Latex allergy status: Secondary | ICD-10-CM | POA: Insufficient documentation

## 2022-09-18 DIAGNOSIS — M546 Pain in thoracic spine: Secondary | ICD-10-CM | POA: Insufficient documentation

## 2022-09-18 DIAGNOSIS — R1013 Epigastric pain: Secondary | ICD-10-CM | POA: Insufficient documentation

## 2022-09-18 DIAGNOSIS — R0789 Other chest pain: Secondary | ICD-10-CM | POA: Insufficient documentation

## 2022-09-18 DIAGNOSIS — Z7982 Long term (current) use of aspirin: Secondary | ICD-10-CM | POA: Diagnosis not present

## 2022-09-18 LAB — CBC
HCT: 37.8 % (ref 36.0–46.0)
Hemoglobin: 12.1 g/dL (ref 12.0–15.0)
MCH: 25.4 pg — ABNORMAL LOW (ref 26.0–34.0)
MCHC: 32 g/dL (ref 30.0–36.0)
MCV: 79.4 fL — ABNORMAL LOW (ref 80.0–100.0)
Platelets: 317 10*3/uL (ref 150–400)
RBC: 4.76 MIL/uL (ref 3.87–5.11)
RDW: 16.7 % — ABNORMAL HIGH (ref 11.5–15.5)
WBC: 10.6 10*3/uL — ABNORMAL HIGH (ref 4.0–10.5)
nRBC: 0 % (ref 0.0–0.2)

## 2022-09-18 LAB — HEPATIC FUNCTION PANEL
ALT: 9 U/L (ref 0–44)
AST: 13 U/L — ABNORMAL LOW (ref 15–41)
Albumin: 4.1 g/dL (ref 3.5–5.0)
Alkaline Phosphatase: 72 U/L (ref 38–126)
Bilirubin, Direct: 0.1 mg/dL (ref 0.0–0.2)
Indirect Bilirubin: 0.4 mg/dL (ref 0.3–0.9)
Total Bilirubin: 0.5 mg/dL (ref 0.3–1.2)
Total Protein: 7.4 g/dL (ref 6.5–8.1)

## 2022-09-18 LAB — BASIC METABOLIC PANEL
Anion gap: 10 (ref 5–15)
BUN: 9 mg/dL (ref 6–20)
CO2: 21 mmol/L — ABNORMAL LOW (ref 22–32)
Calcium: 9.2 mg/dL (ref 8.9–10.3)
Chloride: 105 mmol/L (ref 98–111)
Creatinine, Ser: 0.65 mg/dL (ref 0.44–1.00)
GFR, Estimated: >60 mL/min (ref >60)
Glucose, Bld: 88 mg/dL (ref 70–99)
Potassium: 3.7 mmol/L (ref 3.5–5.1)
Sodium: 136 mmol/L (ref 135–145)

## 2022-09-18 LAB — LIPASE, BLOOD: Lipase: 20 U/L (ref 11–51)

## 2022-09-18 MED ORDER — IOHEXOL 350 MG/ML SOLN: 100.0000 mL | Freq: Once | INTRAVENOUS | Status: DC | PRN

## 2022-09-18 MED ORDER — IOHEXOL 350 MG/ML SOLN
100.0000 mL | Freq: Once | INTRAVENOUS | Status: AC | PRN
  Administered 2022-09-18: 100 mL via INTRAVENOUS

## 2022-09-18 MED ORDER — LORAZEPAM 2 MG/ML IJ SOLN
1.0000 mg | Freq: Once | INTRAMUSCULAR | Status: AC
  Administered 2022-09-18: 1 mg via INTRAVENOUS
  Filled 2022-09-18: qty 1

## 2022-09-18 MED ORDER — PANTOPRAZOLE SODIUM 40 MG IV SOLR
40.0000 mg | Freq: Once | INTRAVENOUS | Status: AC
  Administered 2022-09-18: 40 mg via INTRAVENOUS
  Filled 2022-09-18: qty 10

## 2022-09-18 MED ORDER — METHOCARBAMOL 500 MG PO TABS: 500.0000 mg | ORAL_TABLET | Freq: Four times a day (QID) | ORAL | 0 refills | Status: DC | PRN

## 2022-09-18 MED ORDER — KETOROLAC TROMETHAMINE 30 MG/ML IJ SOLN
30.0000 mg | Freq: Once | INTRAMUSCULAR | Status: AC
  Administered 2022-09-18: 30 mg via INTRAVENOUS

## 2022-09-18 MED ORDER — HYDROMORPHONE HCL 1 MG/ML IJ SOLN
1.0000 mg | Freq: Once | INTRAMUSCULAR | Status: AC
  Administered 2022-09-18: 1 mg via INTRAVENOUS
  Filled 2022-09-18: qty 1

## 2022-09-18 MED ORDER — METHYLPREDNISOLONE 4 MG PO TBPK: ORAL_TABLET | ORAL | 0 refills | Status: DC

## 2022-09-18 MED ORDER — SODIUM CHLORIDE 0.9 % IV BOLUS
1000.0000 mL | Freq: Once | INTRAVENOUS | Status: AC
  Administered 2022-09-18: 1000 mL via INTRAVENOUS

## 2022-09-18 MED ORDER — METHOCARBAMOL 1000 MG/10ML IJ SOLN
500.0000 mg | Freq: Once | INTRAMUSCULAR | Status: AC
  Administered 2022-09-18: 500 mg via INTRAVENOUS
  Filled 2022-09-18: qty 10

## 2022-09-18 NOTE — ED Provider Notes (Signed)
[facility] EMERGENCY DEPT Provider Note   CSN: [id number] Arrival date & time: 09/18/22  0932     History  Chief Complaint  Patient presents with   Back Pain    [name] Costa is a 41 y.o. female.  HPI Patient went to bed feeling well last night.  She suddenly awakened this morning with severe pain in her right thoracic back.  She reports the pain goes over from her central back out to her shoulder.  Does not radiate down the arm.  Patient denies any associated chest pain.  No associated abdominal pain.  No flank pain.  No shortness of breath.  No nausea no vomiting.  Pain is much worse with movements.  If she is still in a certain position pain feels improved but leaning forward or twisting at all makes it much worse.  No lower extremity weakness numbness or tingling.  Patient denies any significant history with neck or back problems.  Patient reports that she has history of a stroke of unknown etiology that had left her with left-sided weakness but that has resolved.  Patient is not on any anticoagulants.  Denies has been sick recently no recent fever chills or cough.    Home Medications Prior to Admission medications   Medication Sig Start Date End Date Taking? Authorizing Provider  methocarbamol (ROBAXIN) 500 MG tablet Take 1 tablet (500 mg total) by mouth every 6 (six) hours as needed for muscle spasms. 09/18/22  Yes Arby Barrette, MD  methylPREDNISolone (MEDROL DOSEPAK) 4 MG TBPK tablet Per Dosepak instructions. 09/18/22  Yes Arby Barrette, MD  acetaminophen (TYLENOL) 325 MG tablet Take 2 tablets (650 mg total) by mouth every 4 (four) hours as needed for mild pain (or temp > 37.5 C (99.5 F)). 06/07/21   Arline Asp, NP  amLODipine (NORVASC) 5 MG tablet Take 1 tablet (5 mg total) by mouth daily. 08/08/21 08/08/22  Horton Chin, MD  aspirin 81 MG chewable tablet Chew 1 tablet (81 mg total) by mouth daily. 06/08/21   Arline Asp, NP  atorvastatin  (LIPITOR) 40 MG tablet Take 1 tablet (40 mg total) by mouth daily. 02/05/22   Micki Riley, MD  naproxen sodium (ANAPROX DS) 550 MG tablet Take 1 tablet (550 mg total) by mouth 2 (two) times daily with a meal. 04/02/22   Valinda Hoar, NP      Allergies    Latex    Review of Systems   Review of Systems  Physical Exam Updated Vital Signs BP 138/81   Pulse 67   Temp 98.4 F (36.9 C) (Oral)   Resp 17   Ht 5\' 11"  (1.803 m)   Wt 99.8 kg   LMP 08/15/2022 Comment: has tubal ligation  SpO2 100%   BMI 30.68 kg/m  Physical Exam Constitutional:      Comments: Alert nontoxic no respiratory distress clinically well in appearance.  HENT:     Head: Normocephalic and atraumatic.     Mouth/Throat:     Pharynx: Oropharynx is clear.  Eyes:     Extraocular Movements: Extraocular movements intact.  Cardiovascular:     Rate and Rhythm: Normal rate and regular rhythm.  Pulmonary:     Effort: Pulmonary effort is normal.     Breath sounds: Normal breath sounds.  Abdominal:     General: There is no distension.     Palpations: Abdomen is soft.     Tenderness: There is no abdominal tenderness. There is no  guarding.  Musculoskeletal:     Comments: Patient's pain was significantly reproduced by moving forward and repositioning in the stretcher.  Patient endorsed significant reproducible pain to palpation in the paraspinous muscle bodies on the right and out toward the blade of the scapula.  No appreciable rashes or soft tissue abnormalities.  Right arm with no swelling normal radial pulse hand is warm and dry with brisk cap refill.  Bilateral lower extremities no peripheral edema calves soft and nontender.  Skin:    General: Skin is warm and dry.  Neurological:     General: No focal deficit present.     Mental Status: She is oriented to person, place, and time.     Motor: No weakness.  Psychiatric:        Mood and Affect: Mood normal.     ED Results / Procedures / Treatments   Labs (all  labs ordered are listed, but only abnormal results are displayed) Labs Reviewed  BASIC METABOLIC PANEL - Abnormal; Notable for the following components:      Result Value   CO2 21 (*)    All other components within normal limits  CBC - Abnormal; Notable for the following components:   WBC 10.6 (*)    MCV 79.4 (*)    MCH 25.4 (*)    RDW 16.7 (*)    All other components within normal limits  HEPATIC FUNCTION PANEL - Abnormal; Notable for the following components:   AST 13 (*)    All other components within normal limits  LIPASE, BLOOD    EKG EKG Interpretation  Date/Time:  Thursday September 18 2022 12:19:32 EDT Ventricular Rate:  62 PR Interval:  156 QRS Duration: 78 QT Interval:  411 QTC Calculation: 418 R Axis:   52 Text Interpretation: Sinus rhythm normal, no change from first previous Confirmed by Arby Barrette [phone]) on 09/18/2022 1:23:26 PM  Radiology CT Angio Chest/Abd/Pel for Dissection W and/or W/WO  Result Date: 09/18/2022 CLINICAL DATA:  Sudden pain in the right and central back upon awakening this morning, worsening with movement. EXAM: CT ANGIOGRAPHY CHEST, ABDOMEN AND PELVIS TECHNIQUE: Non-contrast CT of the chest was initially obtained. Multidetector CT imaging through the chest, abdomen and pelvis was performed using the standard protocol during bolus administration of intravenous contrast. Multiplanar reconstructed images and MIPs were obtained and reviewed to evaluate the vascular anatomy. RADIATION DOSE REDUCTION: This exam was performed according to the departmental dose-optimization program which includes automated exposure control, adjustment of the mA and/or kV according to patient size and/or use of iterative reconstruction technique. CONTRAST:  OMNIPAQUE IOHEXOL 350 MG/ML SOLN COMPARISON:  Chest radiograph 09/18/2022 FINDINGS: CTA CHEST FINDINGS Cardiovascular: Initial noncontrast images demonstrate no hyperdensity along the aortic wall to indicate  acute intramural hematoma. Counting for streak artifact from dense contrast medium in the brachiocephalic vein and SVC, there no findings of aortic or branch vessel dissection or substantial atherosclerotic vascular disease involving the aorta or branch vessels. No acute aortic findings. Timing on today's exam was optimized to assess the systemic arterial vasculature. There is adequate contrast in the pulmonary arteries to indicate the absence of large or central pulmonary emboli, reduced sensitivity for subsegmental or small segmental emboli due to technique. Mediastinum/Nodes: Unremarkable Lungs/Pleura: Minimal dependent subsegmental atelectasis in the posterior basal segments of both lower lobes. 3 mm right lower lobe pulmonary nodule on image 75 series 6. Musculoskeletal: Subtle levoconvex upper thoracic scoliosis. Otherwise unremarkable. Review of the MIP images confirms the above findings. CTA  ABDOMEN AND PELVIS FINDINGS VASCULAR Aorta: Unremarkable Celiac: Widely patent, conventional branching pattern. SMA: Widely patent, no observed filling defect Renals: Patent single bilateral renal arteries. IMA: Small caliber but patent. 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 No acute vascular finding. Veins: Unremarkable for the arterial phase of contrast. Review of the MIP images confirms the above findings. NON-VASCULAR Hepatobiliary: Unremarkable arterial phase appearance. Pancreas: Unremarkable Spleen: Unremarkable Adrenals/Urinary Tract: Benign 1.8 by 1.3 cm exophytic right kidney lower pole cyst, image 176 series 4. This warrants no further workup. Normal renal parenchymal enhancement. Adrenal glands in urinary bladder appear unremarkable. Stomach/Bowel: Unremarkable.  Normal appendix. Lymphatic: No pathologic adenopathy observed. Reproductive: Unremarkable Other: No supplemental non-categorized findings. Musculoskeletal: Rim  sclerotic non expansile 2.3 by 0.6 by 2.5 cm lesion in the left iliac bone on image 225 series 4. Given the sclerotic rim this is likely a benign lesion such as enchondroma, and is not felt to warrant further workup unless indicated from a clinical standpoint. Review of the MIP images confirms the above findings. IMPRESSION: 1. No findings of aortic or branch vessel dissection or acute aortic findings. No acute vascular findings. 2. 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 3. 3 mm right lower lobe pulmonary nodule. No follow-up needed if patient is low-risk.This recommendation follows the consensus statement: Guidelines for Management of Incidental Pulmonary Nodules Detected on CT Images: From the Fleischner Society 2017; Radiology 2017; 284:228-243. 4. Rim sclerotic non expansile 2.3 by 0.6 by 2.5 cm lesion in the left iliac bone, highly likely to be benign and incidental. Electronically Signed   By: Van Clines M.D.   On: 09/18/2022 13:26   DG Chest 2 View  Result Date: 09/18/2022 CLINICAL DATA:  Right chest pain EXAM: CHEST - 2 VIEW COMPARISON:  None Available. FINDINGS: Cardiac size is within normal limits. There are no signs of alveolar pulmonary edema or focal consolidation. There is questionable small faint radiopacity in the right parahilar region. There is no pleural effusion or pneumothorax. IMPRESSION: There are no signs of focal pulmonary consolidation or pleural effusion or pneumothorax. There is 2 cm faint radiopacity in the right parahilar region which may be an artifact due to confluence of normal bronchovascular structures or small early infiltrate. If symptoms persist, short-term follow-up chest radiographs may be considered. Electronically Signed   By: Elmer Picker M.D.   On: 09/18/2022 11:17    Procedures Procedures   CRITICAL CARE Performed by: Charlesetta Shanks   Total critical care time: 30 minutes  Critical care time was  exclusive of separately billable procedures and treating other patients.  Critical care was necessary to treat or prevent imminent or life-threatening deterioration.  Critical care was time spent personally by me on the following activities: development of treatment plan with patient and/or surrogate as well as nursing, discussions with consultants, evaluation of patient's response to treatment, examination of patient, obtaining history from patient or surrogate, ordering and performing treatments and interventions, ordering and review of laboratory studies, ordering and review of radiographic studies, pulse oximetry and re-evaluation of patient's condition.  Medications Ordered in ED Medications  iohexol (OMNIPAQUE) 350 MG/ML injection 100 mL (has no administration in time range)  ketorolac (TORADOL) 30 MG/ML injection 30 mg (30 mg Intravenous Given 09/18/22 1125)  HYDROmorphone (DILAUDID) injection 1 mg (1 mg Intravenous Given 09/18/22 1124)  methocarbamol (ROBAXIN) injection 500 mg (500 mg Intravenous Given 09/18/22 1116)  pantoprazole (PROTONIX) injection  40 mg (40 mg Intravenous Given 09/18/22 1159)  sodium chloride 0.9 % bolus 1,000 mL (0 mLs Intravenous Stopped 09/18/22 1353)  iohexol (OMNIPAQUE) 350 MG/ML injection 100 mL (100 mLs Intravenous Contrast Given 09/18/22 1244)  LORazepam (ATIVAN) injection 1 mg (1 mg Intravenous Given 09/18/22 1231)    ED Course/ Medical Decision Making/ A&P                           Medical Decision Making Amount and/or Complexity of Data Reviewed Labs: ordered. Radiology: ordered.  Risk Prescription drug management.  Patient presents with thoracic back pain that is severe.  She is extremely uncomfortable in appearance and with any movements or deep breaths extreme pain.  Patient has medical history of prior stroke of unknown etiology but no residual deficits and asthma.  Differential diagnosis includes PE\aortic  dissection\pneumothorax\musculoskeletal pain\biliary colic.  Will initiate pain control with Dilaudid, Toradol and Robaxin.  Proceed with diagnostic evaluation  2 view chest x-ray reviewed by radiology and reviewed and visualized directly by myself, no pneumothorax, no mediastinal widening no focal consolidation.  11: 50 after administration of pain medications, patient is in significant distress. reports thoracic back pain has somewhat improved but now she feels lightheaded and also has pain in the epigastrium, overall feeling much worse.  Blood pressures remained stable.  We will proceed with CT angio to rule out dissection or central PE.  Will add Protonix and a liter of fluids to medications.  CT scan interpreted by radiology and also visually reviewed by myself, no acute findings of dissection or central PE.  No consolidations or other intra-abdominal process.  Note made of early atherosclerotic disease and incidental benign appearing lesions.  15: 15 patient's pain has now improved significantly.  She is up and ambulatory.  Vital signs are stable patient is normotensive.  With diagnostic test ruling out dissection, PE, MI, Pneumonia, Have high suspicion at this time for musculoskeletal pain.  Plan will be for patient to take Medrol Dosepak and Robaxin.  We have extensively reviewed strict return precautions and follow-up plan.          Final Clinical Impression(s) / ED Diagnoses Final diagnoses:  Acute right-sided thoracic back pain  Epigastric pain    Rx / DC Orders ED Discharge Orders          Ordered    methocarbamol (ROBAXIN) 500 MG tablet  Every 6 hours PRN        09/18/22 1527    methylPREDNISolone (MEDROL DOSEPAK) 4 MG TBPK tablet        09/18/22 1527              Arby Barrette, MD 09/21/22 1720

## 2022-09-18 NOTE — Discharge Instructions (Signed)
1.  At this time your pain appears to be muscular pain.  You had CT scans done in the emergency department that are not showing other causes at this time.  However, if you develop shortness of breath, fever or other worsening symptoms, return for recheck. 2.  Start your Medrol Dosepak as prescribed today.  Robaxin is a muscle relaxer as needed.  Take extra strength Tylenol with these combination every 6 hours for additional pain control as needed.  It may take 12 to 24 hours for the steroids (your Medrol Dosepak) to start taking effect. 3.  Schedule follow-up with your doctor for recheck as soon as possible.  Return to the emergency department if you get new worsening or concerning symptoms.

## 2022-09-18 NOTE — ED Triage Notes (Signed)
With waking up this am sudden pain to right and central back.  Worse with movement.  Hx of stroke.

## 2022-09-18 NOTE — ED Notes (Signed)
Visitors called to nurses station. RN into pt's room. Pt reports feeling worse, new s/s of light headed and CP. Doctor Pfeiffer into room as pt is telling Therapist, sports. RN place pt on cardiac monitor. MD placing new orders.

## 2022-12-03 IMAGING — CT CT ANGIO HEAD-NECK (W OR W/O PERF)
2 of 7 series · 8 of 33 positions shown · IV contrast (APPLIED)
Comparison: Head CT from earlier today

CLINICAL DATA: Left-sided weakness

EXAM:
CT ANGIOGRAPHY HEAD AND NECK
TECHNIQUE: Multidetector CT imaging of the head and neck was performed using
the standard protocol during bolus administration of intravenous
contrast. Multiplanar CT image reconstructions and MIPs were
obtained to evaluate the vascular anatomy. Carotid stenosis
measurements (when applicable) are obtained utilizing NASCET
criteria, using the distal internal carotid diameter as the
denominator.
CONTRAST:  75mL OMNIPAQUE IOHEXOL 350 MG/ML SOLN

[Series 5: cta neck/head · axial · 0.55mm/px · z∈[-261,-139]mm · 2 of 183 slices shown]
[im 61/183  soft-tissue]
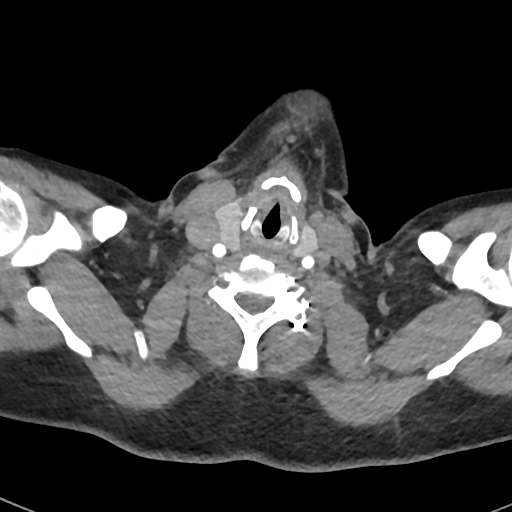
[im 122/183  soft-tissue]
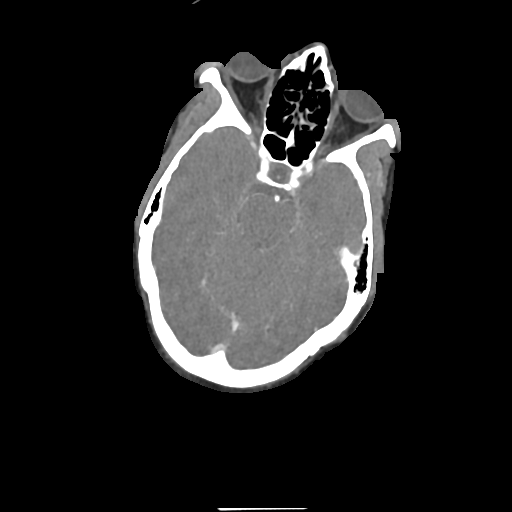

[Series 7: ax thins · axial · 0.39mm/px · z∈[-330,-68]mm · 6 of 367 slices shown]
[im 53/367  soft-tissue]
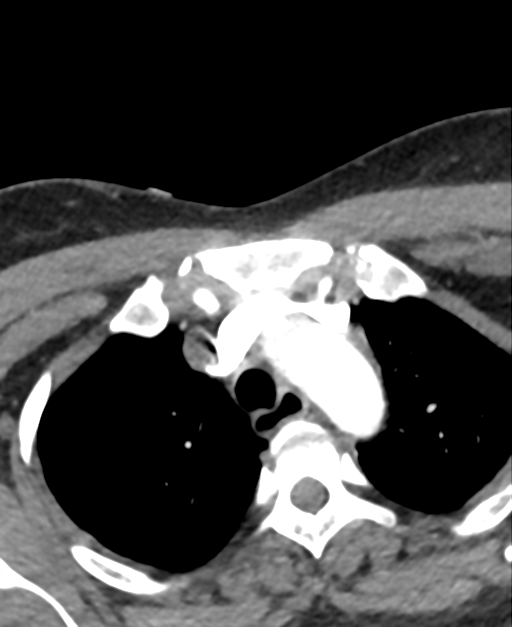
[im 105/367  bone]
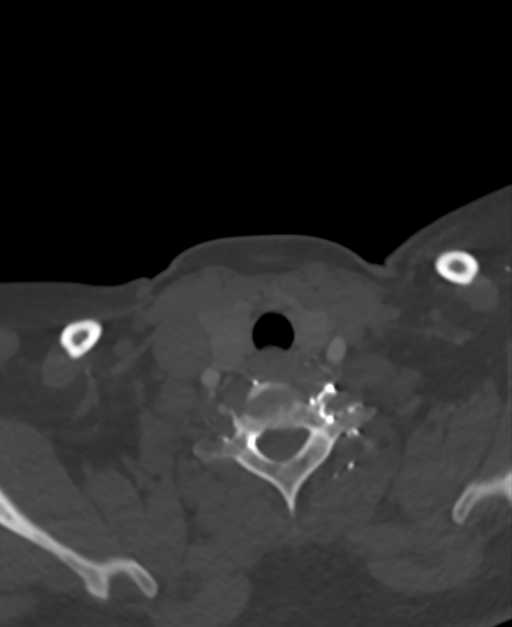
[im 157/367  soft-tissue]
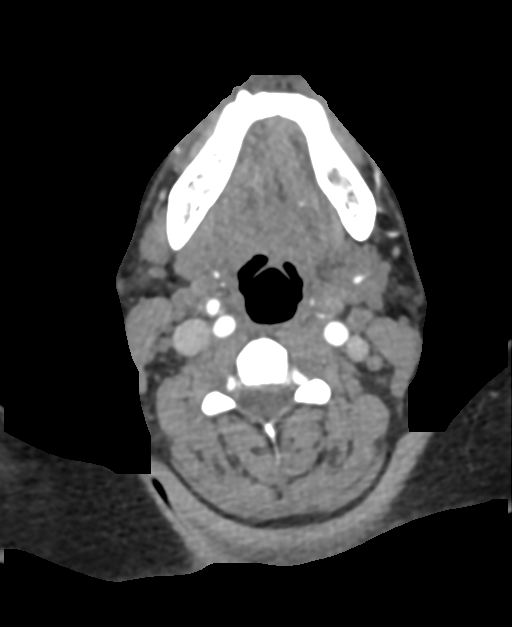
[im 210/367  bone]
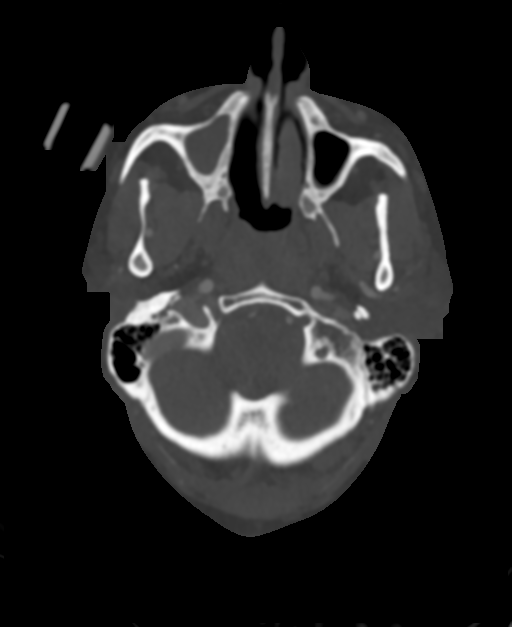
[im 262/367  soft-tissue]
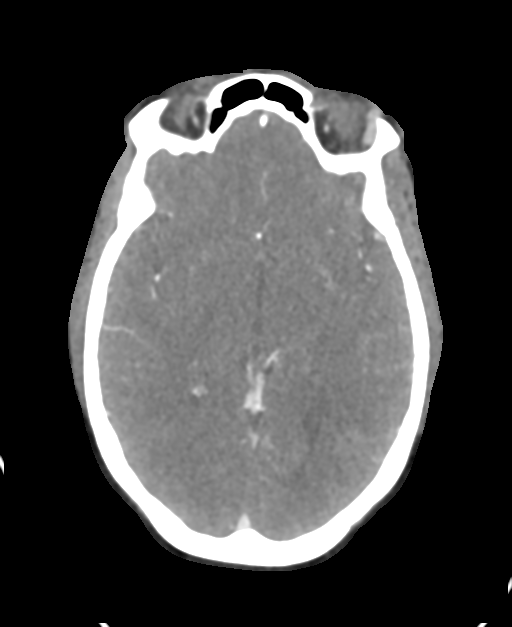
[im 314/367  bone]
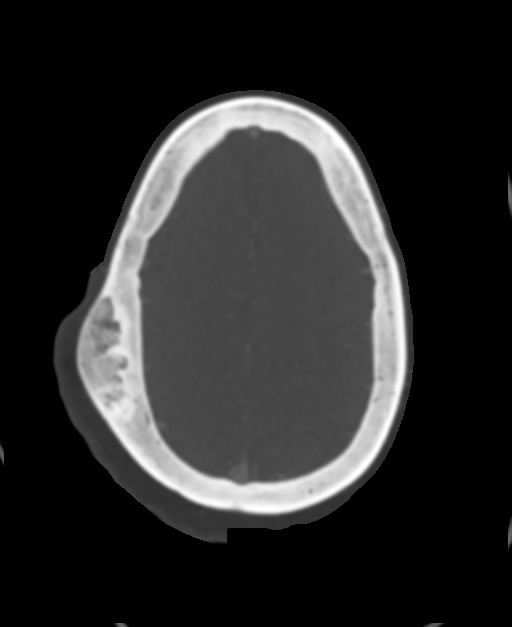

[8 of 33 positions shown; findings below may reference images not displayed]

FINDINGS: CTA NECK FINDINGS

Aortic arch: Normal.  Three vessel branching.

Right carotid system: Vessels are smooth and widely patent.

Left carotid system: Vessels are smooth and widely patent. No
atheromatous changes.

Vertebral arteries: No proximal subclavian stenosis. Limited
assessment of the vertebral arteries due to intravenous contrast
reflux on the left more than right.

Skeleton: Expansile sclerotic right parietal bone lesion, most
consistent with fibrous dysplasia. Scoliosis.

Other neck: No acute or aggressive finding in the neck soft tissues.
Partial bilateral maxillary sinus opacification

Upper chest: Negative

Review of the MIP images confirms the above findings

CTA HEAD FINDINGS

Anterior circulation: Vessels are smooth and widely patent. No
branch occlusion, beading, or aneurysm.

Posterior circulation: Vessels are smooth and widely patent. No
branch occlusion, beading, or aneurysm.

Venous sinuses: Unremarkable in the arterial phase

Anatomic variants: Hypoplastic left A1 segment and azygos A2
segment.

Review of the MIP images confirms the above findings
IMPRESSION: No emergent finding.  No stenosis or atheromatous changes.

## 2022-12-03 IMAGING — CT CT ANGIO HEAD
2 of 7 series · 8 of 33 positions shown · IV contrast (APPLIED)
Comparison: Head CT from earlier today

CLINICAL DATA: Left-sided weakness

EXAM:
CT ANGIOGRAPHY HEAD AND NECK
TECHNIQUE: Multidetector CT imaging of the head and neck was performed using
the standard protocol during bolus administration of intravenous
contrast. Multiplanar CT image reconstructions and MIPs were
obtained to evaluate the vascular anatomy. Carotid stenosis
measurements (when applicable) are obtained utilizing NASCET
criteria, using the distal internal carotid diameter as the
denominator.
CONTRAST:  75mL OMNIPAQUE IOHEXOL 350 MG/ML SOLN

[Series 5: cta neck/head · axial · 0.55mm/px · z∈[-261,-139]mm · 2 of 183 slices shown]
[im 61/183  soft-tissue]
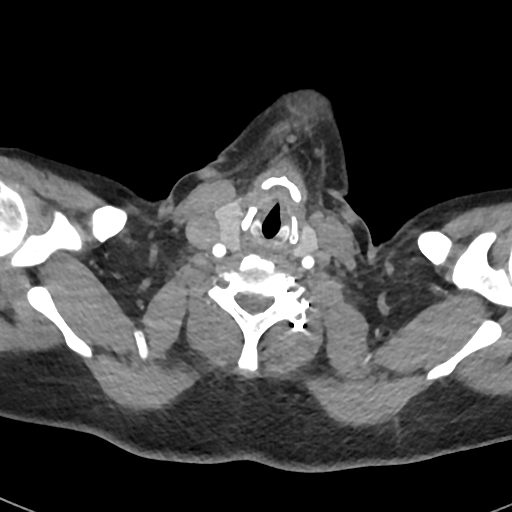
[im 122/183  soft-tissue]
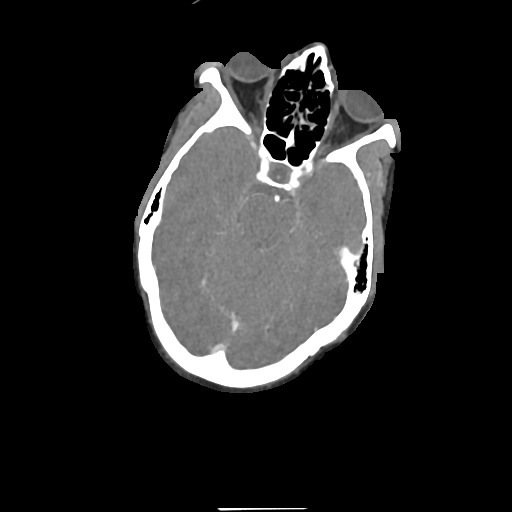

[Series 7: ax thins · axial · 0.39mm/px · z∈[-330,-68]mm · 6 of 367 slices shown]
[im 53/367  soft-tissue]
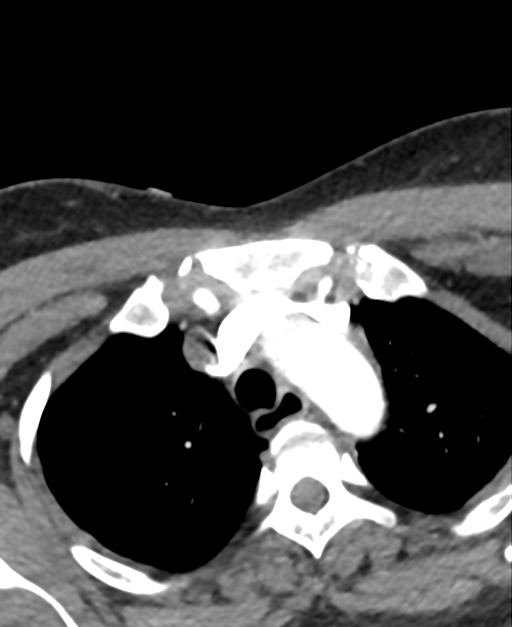
[im 105/367  bone]
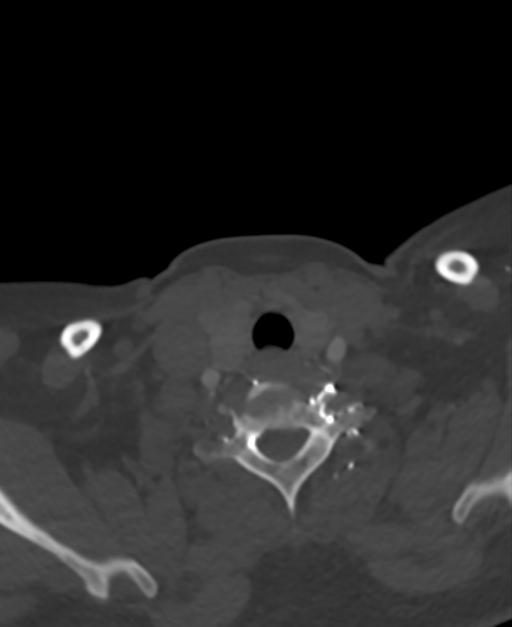
[im 157/367  soft-tissue]
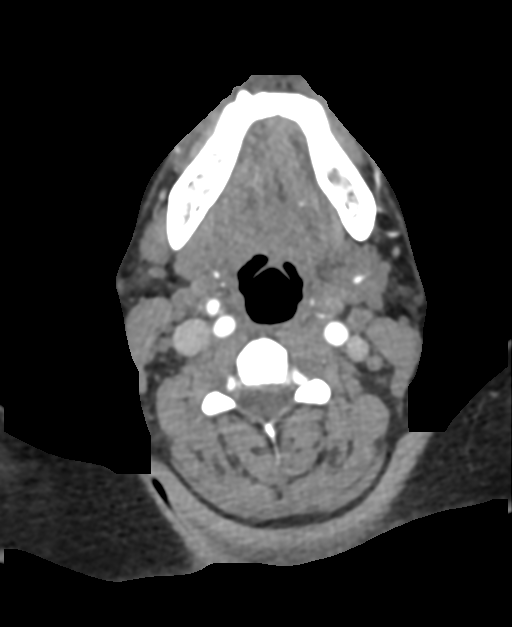
[im 210/367  bone]
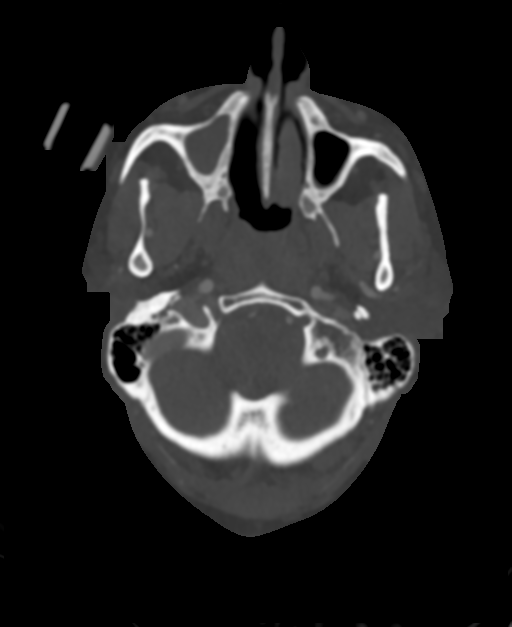
[im 262/367  soft-tissue]
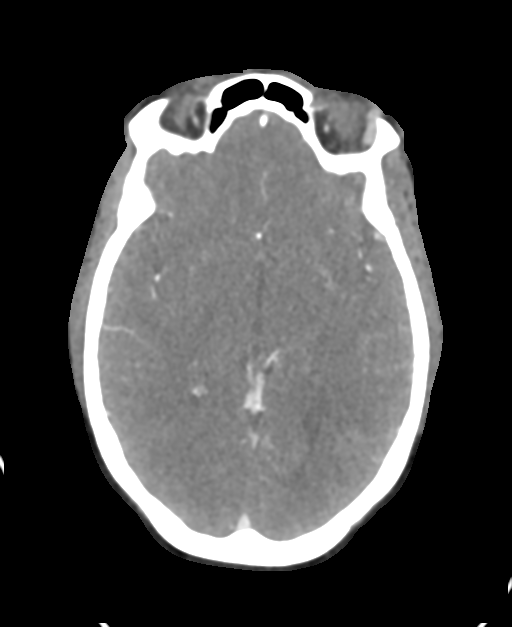
[im 314/367  bone]
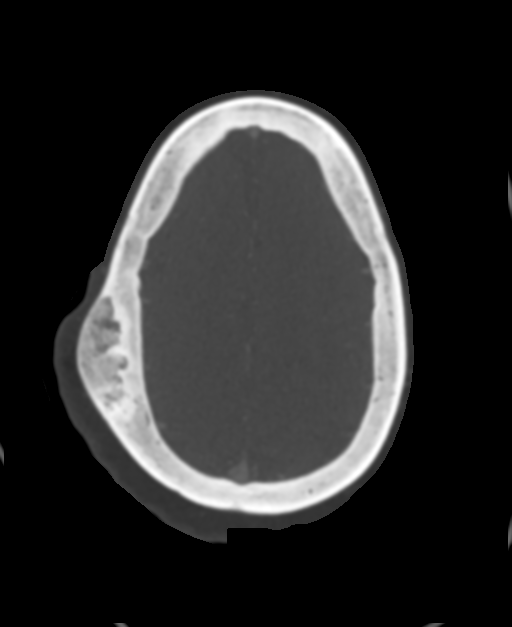

[8 of 33 positions shown; findings below may reference images not displayed]

FINDINGS: CTA NECK FINDINGS

Aortic arch: Normal.  Three vessel branching.

Right carotid system: Vessels are smooth and widely patent.

Left carotid system: Vessels are smooth and widely patent. No
atheromatous changes.

Vertebral arteries: No proximal subclavian stenosis. Limited
assessment of the vertebral arteries due to intravenous contrast
reflux on the left more than right.

Skeleton: Expansile sclerotic right parietal bone lesion, most
consistent with fibrous dysplasia. Scoliosis.

Other neck: No acute or aggressive finding in the neck soft tissues.
Partial bilateral maxillary sinus opacification

Upper chest: Negative

Review of the MIP images confirms the above findings

CTA HEAD FINDINGS

Anterior circulation: Vessels are smooth and widely patent. No
branch occlusion, beading, or aneurysm.

Posterior circulation: Vessels are smooth and widely patent. No
branch occlusion, beading, or aneurysm.

Venous sinuses: Unremarkable in the arterial phase

Anatomic variants: Hypoplastic left A1 segment and azygos A2
segment.

Review of the MIP images confirms the above findings
IMPRESSION: No emergent finding.  No stenosis or atheromatous changes.

## 2023-01-08 ENCOUNTER — Emergency Department (HOSPITAL_COMMUNITY)
Admission: EM | Admit: 2023-01-08 | Discharge: 2023-01-08 | Disposition: A | Discharge status: Home / Self Care | Payer: 59 | Attending: Emergency Medicine | Admitting: Emergency Medicine

## 2023-01-08 ENCOUNTER — Emergency Department (HOSPITAL_COMMUNITY): Payer: 59

## 2023-01-08 DIAGNOSIS — Z20822 Contact with and (suspected) exposure to covid-19: Secondary | ICD-10-CM | POA: Insufficient documentation

## 2023-01-08 DIAGNOSIS — R519 Headache, unspecified: Secondary | ICD-10-CM | POA: Diagnosis not present

## 2023-01-08 DIAGNOSIS — F17200 Nicotine dependence, unspecified, uncomplicated: Secondary | ICD-10-CM | POA: Diagnosis not present

## 2023-01-08 DIAGNOSIS — Z7982 Long term (current) use of aspirin: Secondary | ICD-10-CM | POA: Insufficient documentation

## 2023-01-08 DIAGNOSIS — R251 Tremor, unspecified: Secondary | ICD-10-CM | POA: Diagnosis not present

## 2023-01-08 DIAGNOSIS — J45909 Unspecified asthma, uncomplicated: Secondary | ICD-10-CM | POA: Diagnosis not present

## 2023-01-08 DIAGNOSIS — N3001 Acute cystitis with hematuria: Secondary | ICD-10-CM

## 2023-01-08 DIAGNOSIS — R42 Dizziness and giddiness: Secondary | ICD-10-CM | POA: Diagnosis not present

## 2023-01-08 DIAGNOSIS — Z91040 Latex allergy status: Secondary | ICD-10-CM | POA: Insufficient documentation

## 2023-01-08 LAB — COMPREHENSIVE METABOLIC PANEL
ALT: 12 U/L (ref 0–44)
AST: 17 U/L (ref 15–41)
Albumin: 3.3 g/dL — ABNORMAL LOW (ref 3.5–5.0)
Alkaline Phosphatase: 63 U/L (ref 38–126)
Anion gap: 9 (ref 5–15)
BUN: 8 mg/dL (ref 6–20)
CO2: 23 mmol/L (ref 22–32)
Calcium: 8.9 mg/dL (ref 8.9–10.3)
Chloride: 105 mmol/L (ref 98–111)
Creatinine, Ser: 0.78 mg/dL (ref 0.44–1.00)
GFR, Estimated: >60 mL/min (ref >60)
Glucose, Bld: 94 mg/dL (ref 70–99)
Potassium: 3.8 mmol/L (ref 3.5–5.1)
Sodium: 137 mmol/L (ref 135–145)
Total Bilirubin: 0.4 mg/dL (ref 0.3–1.2)
Total Protein: 6.6 g/dL (ref 6.5–8.1)

## 2023-01-08 LAB — URINALYSIS, ROUTINE W REFLEX MICROSCOPIC
Bilirubin Urine: NEGATIVE
Glucose, UA: NEGATIVE mg/dL
Hgb urine dipstick: NEGATIVE
Ketones, ur: 5 mg/dL — AB
Nitrite: POSITIVE — AB
Protein, ur: 30 mg/dL — AB
Specific Gravity, Urine: 1.021 (ref 1.005–1.030)
WBC, UA: >50 WBC/hpf (ref 0–5)
pH: 6 (ref 5.0–8.0)

## 2023-01-08 LAB — CBC WITH DIFFERENTIAL/PLATELET
Abs Immature Granulocytes: 0.04 10*3/uL (ref 0.00–0.07)
Basophils Absolute: 0 10*3/uL (ref 0.0–0.1)
Basophils Relative: 0 %
Eosinophils Absolute: 0.2 10*3/uL (ref 0.0–0.5)
Eosinophils Relative: 2 %
HCT: 37 % (ref 36.0–46.0)
Hemoglobin: 11.6 g/dL — ABNORMAL LOW (ref 12.0–15.0)
Immature Granulocytes: 0 %
Lymphocytes Relative: 18 %
Lymphs Abs: 1.7 10*3/uL (ref 0.7–4.0)
MCH: 26.3 pg (ref 26.0–34.0)
MCHC: 31.4 g/dL (ref 30.0–36.0)
MCV: 83.9 fL (ref 80.0–100.0)
Monocytes Absolute: 0.6 10*3/uL (ref 0.1–1.0)
Monocytes Relative: 7 %
Neutro Abs: 6.7 10*3/uL (ref 1.7–7.7)
Neutrophils Relative %: 73 %
Platelets: 250 10*3/uL (ref 150–400)
RBC: 4.41 MIL/uL (ref 3.87–5.11)
RDW: 16 % — ABNORMAL HIGH (ref 11.5–15.5)
WBC: 9.2 10*3/uL (ref 4.0–10.5)
nRBC: 0 % (ref 0.0–0.2)

## 2023-01-08 LAB — TROPONIN I (HIGH SENSITIVITY): Troponin I (High Sensitivity): 6 ng/L (ref <18)

## 2023-01-08 LAB — RESP PANEL BY RT-PCR (RSV, FLU A&B, COVID)  RVPGX2
Influenza A by PCR: NEGATIVE
Influenza B by PCR: NEGATIVE
Resp Syncytial Virus by PCR: NEGATIVE
SARS Coronavirus 2 by RT PCR: NEGATIVE

## 2023-01-08 LAB — PREGNANCY, URINE: Preg Test, Ur: NEGATIVE

## 2023-01-08 LAB — CBG MONITORING, ED: Glucose-Capillary: 93 mg/dL (ref 70–99)

## 2023-01-08 MED ORDER — ACETAMINOPHEN 500 MG PO TABS
1000.0000 mg | ORAL_TABLET | ORAL | Status: AC
  Administered 2023-01-08: 1000 mg via ORAL
  Filled 2023-01-08: qty 2

## 2023-01-08 MED ORDER — NITROFURANTOIN MONOHYD MACRO 100 MG PO CAPS: 100.0000 mg | ORAL_CAPSULE | Freq: Two times a day (BID) | ORAL | 0 refills | Status: DC

## 2023-01-08 MED ORDER — SODIUM CHLORIDE 0.9 % IV SOLN
1.0000 g | Freq: Once | INTRAVENOUS | Status: AC
  Administered 2023-01-08: 1 g via INTRAVENOUS
  Filled 2023-01-08: qty 10

## 2023-01-08 MED ORDER — LACTATED RINGERS IV BOLUS
1000.0000 mL | Freq: Once | INTRAVENOUS | Status: AC
  Administered 2023-01-08: 1000 mL via INTRAVENOUS

## 2023-01-08 MED ORDER — IOHEXOL 350 MG/ML SOLN
75.0000 mL | Freq: Once | INTRAVENOUS | Status: AC | PRN
  Administered 2023-01-08: 75 mL via INTRAVENOUS

## 2023-01-08 NOTE — ED Provider Notes (Signed)
Accepted handoff at shift change from [place] PA-C. Please see prior provider note for more detail.   Briefly: Patient is 42 y.o.   Per prior PA "[name] Costa is a 42 y.o. female. With past medical history of basal ganglia stroke, asthma, tobacco use who presents to the emergency department with lightheadedness.    She states that yesterday around 9:30-10am she was seated at her home office when she states she was having difficulty typing. She states it was like she "forgot how to type and was making a lot of mistakes." She states after this she began feeling lightheaded and then had hand tremors. She states she got up to grab water and called her PCP who was to see her this morning. At the PCP she again began to feel lightheaded and they recommended she come to the ED for evaluation. She endorses having mild frontal headache. She denies changes to her vision, dizziness, neck pain, head or neck trauma, numbness/tingling or weakness to her extremities, slurred speech, facial droop, nausea or vomiting, chest pain, shortness of breath or syncope. "  Patient also notably tells me that she has been peeing more frequently and feels that she may have a UTI and feels more fatigued and weaker than usual.   Plan: re-evaluate after CTA    Physical Exam  BP (!) 142/97   Pulse (!) 58   Temp 98.6 F (37 C) (Oral)   Resp (!) 23   Ht 5' 11"$  (1.803 m)   Wt 102.1 kg   SpO2 100%   BMI 31.38 kg/m   Physical Exam Vitals and nursing note reviewed.  Constitutional:      General: She is not in acute distress. HENT:     Head: Normocephalic and atraumatic.     Nose: Nose normal.  Eyes:     General: No scleral icterus. Cardiovascular:     Rate and Rhythm: Normal rate and regular rhythm.     Pulses: Normal pulses.     Heart sounds: Normal heart sounds.  Pulmonary:     Effort: Pulmonary effort is normal. No respiratory distress.     Breath sounds: No wheezing.  Abdominal:     Palpations: Abdomen is  soft.     Tenderness: There is no abdominal tenderness.  Musculoskeletal:     Cervical back: Normal range of motion.     Right lower leg: No edema.     Left lower leg: No edema.  Skin:    General: Skin is warm and dry.     Capillary Refill: Capillary refill takes less than 2 seconds.  Neurological:     Mental Status: She is alert. Mental status is at baseline.     Comments: Alert and oriented to self, place, time and event.   Speech is fluent, clear without dysarthria or dysphasia.   Strength 5/5 in upper/lower extremities   Sensation intact in upper/lower extremities   Normal gait.  Negative Romberg. No pronator drift.  Normal finger-to-nose and feet tapping.  CN I not tested  CN II grossly intact visual fields bilaterally. Did not visualize posterior eye.  CN III, IV, VI PERRLA and EOMs intact bilaterally  CN V Intact sensation to sharp and light touch to the face  CN VII facial movements symmetric  CN VIII not tested  CN IX, X no uvula deviation, symmetric rise of soft palate  CN XI 5/5 SCM and trapezius strength bilaterally  CN XII Midline tongue protrusion, symmetric L/R movements  Psychiatric:        Mood and Affect: Mood normal.        Behavior: Behavior normal.     Procedures  Procedures Results for orders placed or performed during the hospital encounter of 01/08/23  Resp panel by RT-PCR (RSV, Flu A&B, Covid) Anterior Nasal Swab   Specimen: Anterior Nasal Swab  Result Value Ref Range   SARS Coronavirus 2 by RT PCR NEGATIVE NEGATIVE   Influenza A by PCR NEGATIVE NEGATIVE   Influenza B by PCR NEGATIVE NEGATIVE   Resp Syncytial Virus by PCR NEGATIVE NEGATIVE  Comprehensive metabolic panel  Result Value Ref Range   Sodium 137 135 - 145 mmol/L   Potassium 3.8 3.5 - 5.1 mmol/L   Chloride 105 98 - 111 mmol/L   CO2 23 22 - 32 mmol/L   Glucose, Bld 94 70 - 99 mg/dL   BUN 8 6 - 20 mg/dL   Creatinine, Ser 0.78 0.44 - 1.00 mg/dL   Calcium 8.9 8.9 - 10.3 mg/dL    Total Protein 6.6 6.5 - 8.1 g/dL   Albumin 3.3 (L) 3.5 - 5.0 g/dL   AST 17 15 - 41 U/L   ALT 12 0 - 44 U/L   Alkaline Phosphatase 63 38 - 126 U/L   Total Bilirubin 0.4 0.3 - 1.2 mg/dL   GFR, Estimated >60 >60 mL/min   Anion gap 9 5 - 15  CBC with Differential  Result Value Ref Range   WBC 9.2 4.0 - 10.5 K/uL   RBC 4.41 3.87 - 5.11 MIL/uL   Hemoglobin 11.6 (L) 12.0 - 15.0 g/dL   HCT 37.0 36.0 - 46.0 %   MCV 83.9 80.0 - 100.0 fL   MCH 26.3 26.0 - 34.0 pg   MCHC 31.4 30.0 - 36.0 g/dL   RDW 16.0 (H) 11.5 - 15.5 %   Platelets 250 150 - 400 K/uL   nRBC 0.0 0.0 - 0.2 %   Neutrophils Relative % 73 %   Neutro Abs 6.7 1.7 - 7.7 K/uL   Lymphocytes Relative 18 %   Lymphs Abs 1.7 0.7 - 4.0 K/uL   Monocytes Relative 7 %   Monocytes Absolute 0.6 0.1 - 1.0 K/uL   Eosinophils Relative 2 %   Eosinophils Absolute 0.2 0.0 - 0.5 K/uL   Basophils Relative 0 %   Basophils Absolute 0.0 0.0 - 0.1 K/uL   Immature Granulocytes 0 %   Abs Immature Granulocytes 0.04 0.00 - 0.07 K/uL  Urinalysis, Routine w reflex microscopic -Urine, Clean Catch  Result Value Ref Range   Color, Urine YELLOW YELLOW   APPearance CLOUDY (A) CLEAR   Specific Gravity, Urine 1.021 1.005 - 1.030   pH 6.0 5.0 - 8.0   Glucose, UA NEGATIVE NEGATIVE mg/dL   Hgb urine dipstick NEGATIVE NEGATIVE   Bilirubin Urine NEGATIVE NEGATIVE   Ketones, ur 5 (A) NEGATIVE mg/dL   Protein, ur 30 (A) NEGATIVE mg/dL   Nitrite POSITIVE (A) NEGATIVE   Leukocytes,Ua LARGE (A) NEGATIVE   RBC / HPF 0-5 0 - 5 RBC/hpf   WBC, UA >50 0 - 5 WBC/hpf   Bacteria, UA MANY (A) NONE SEEN   Squamous Epithelial / HPF 6-10 0 - 5 /HPF   Mucus PRESENT   Pregnancy, urine  Result Value Ref Range   Preg Test, Ur NEGATIVE NEGATIVE  CBG monitoring, ED  Result Value Ref Range   Glucose-Capillary 93 70 - 99 mg/dL  Troponin I (High Sensitivity)  Result Value Ref  Range   Troponin I (High Sensitivity) 6 <18 ng/L   CT Angio Head Neck W WO CM  Result Date:  01/08/2023 CLINICAL DATA:  Transient ischemic attack. EXAM: CT HEAD WITHOUT CONTRAST CT ANGIOGRAPHY OF THE HEAD AND NECK TECHNIQUE: Contiguous axial images were obtained from the base of the skull through the vertex without intravenous contrast. Multidetector CT imaging of the head and neck was performed using the standard protocol during bolus administration of intravenous contrast. Multiplanar CT image reconstructions and MIPs were obtained to evaluate the vascular anatomy. Carotid stenosis measurements (when applicable) are obtained utilizing NASCET criteria, using the distal internal carotid diameter as the denominator. RADIATION DOSE REDUCTION: This exam was performed according to the departmental dose-optimization program which includes automated exposure control, adjustment of the mA and/or kV according to patient size and/or use of iterative reconstruction technique. CONTRAST:  71m OMNIPAQUE IOHEXOL 350 MG/ML SOLN COMPARISON:  Head CT 06/05/2021.  MRI brain 06/04/2021. FINDINGS: CT HEAD Brain: Old lacunar infarct in the posterior aspect of the right lentiform nucleus and corona radiata. No acute intracranial hemorrhage. Cortical gray-white differentiation is preserved. No hydrocephalus or extra-axial collection. Basilar cisterns are patent. Vascular: No hyperdense vessel or unexpected calcification. 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 Sinuses/Orbits: Moderate mucosal disease in the maxillary sinuses. Orbits are unremarkable. CTA NECK Aortic arch: Three-vessel arch configuration. Arch vessel origins are patent. Right carotid system: The common and internal carotid arteries are patent to the skull base without stenosis, aneurysm or dissection. Left carotid system: The common and internal carotid arteries are patent to the skull base without stenosis, aneurysm or dissection. Vertebral arteries:Patent from the origin to  the confluence with the basilar without stenosis or dissection. Skeleton: Unremarkable. Other neck: Unremarkable. CTA HEAD Anterior circulation: Intracranial ICAs are patent without stenosis or aneurysm. The proximal ACAs and MCAs are patent without stenosis or aneurysm. Distal branches are symmetric. Posterior circulation: Normal basilar artery. The SCAs, AICAs and PICAs are patent proximally. The PCAs are patent proximally without stenosis or aneurysm. Distal branches are symmetric. Venous sinuses: Patent. Anatomic variants: Azygous A2 segment of the ACAs. IMPRESSION: 1. No acute intracranial abnormality. 2. No large vessel occlusion, hemodynamically significant stenosis, or evidence of dissection. Electronically Signed   By: WEmmit AlexandersM.D.   On: 01/08/2023 18:25    ED Course / MDM   Clinical Course as of 01/10/23 1707  Thu Jan 08, 2023  1624 Yesterday a few episodes took a nap and felt better. This AM had a LH episode at the MD office.  [WF]  [id number]: 1. No acute intracranial abnormality. 2. No large vessel occlusion, hemodynamically significant stenosis, or evidence of dissection.   Electronically Signed   By: WEmmit AlexandersM.D.   On: 01/08/2023 18:25   [WF]    Clinical Course User Index [WF] FTedd Sias PA   Medical Decision Making Amount and/or Complexity of Data Reviewed Labs: ordered.  Risk OTC drugs. Prescription drug management.   This patient presents to the ED for concern of light headedness, this involves a number of treatment options, and is a complaint that carries with it a moderate risk of complications and morbidity. A differential diagnosis was considered for the patient's symptoms which is discussed below:   The differential diagnosis of weakness includes but is not limited to neurologic causes (GBS, myasthenia gravis, CVA, MS, ALS, transverse myelitis, spinal cord injury, CVA, botulism, ) and other causes:  ACS, Arrhythmia, syncope,  orthostatic hypotension, sepsis, hypoglycemia, electrolyte disturbance, hypothyroidism, respiratory failure, symptomatic anemia, dehydration, heat injury, polypharmacy, malignancy.    Co morbidities: Discussed in HPI   Brief History:  Per prior PA "Nayvie L Eisenbraun is a 42 y.o. female. With past medical history of basal ganglia stroke, asthma, tobacco use who presents to the emergency department with lightheadedness.    She states that yesterday around 9:30-10am she was seated at her home office when she states she was having difficulty typing. She states it was like she "forgot how to type and was making a lot of mistakes." She states after this she began feeling lightheaded and then had hand tremors. She states she got up to grab water and called her PCP who was to see her this morning. At the PCP she again began to feel lightheaded and they recommended she come to the ED for evaluation. She endorses having mild frontal headache. She denies changes to her vision, dizziness, neck pain, head or neck trauma, numbness/tingling or weakness to her extremities, slurred speech, facial droop, nausea or vomiting, chest pain, shortness of breath or syncope. "  Patient also notably tells me that she has been peeing more frequently and feels that she may have a UTI and feels more fatigued and weaker than usual.  Plan: re-evaluate after CTA    EMR reviewed including pt PMHx, past surgical history and past visits to ER.   See HPI for more details   Lab Tests:  I ordered and independently interpreted labs. Labs notable for  COVID/FLU/RSV negative CBC without leukocytosis or clinically significant anemia.  CMP unremarkable urinalysis positive for many bacteria large leukocytes and positive for nitrates.  Urine pregnancy negative.   Imaging Studies:  NAD. I personally reviewed all imaging studies and no acute abnormality found. I agree with radiology interpretation. IMPRESSION:  1. No acute  intracranial abnormality.  2. No large vessel occlusion, hemodynamically significant stenosis,  or evidence of dissection.      Electronically Signed    By: Emmit Alexanders M.D.    On: 01/08/2023 18:25     Cardiac Monitoring:  The patient was maintained on a cardiac monitor.  I personally viewed and interpreted the cardiac monitored which showed an underlying rhythm of: NSR EKG non-ischemic    Medicines ordered:  I ordered medication including rocephin, LR, tylenol for UTI, aches and fatigue Reevaluation of the patient after these medicines showed that the patient resolved I have reviewed the patients home medicines and have made adjustments as needed   Critical Interventions:     Consults/Attending Physician   I discussed this case with my attending physician who cosigned this note including patient's presenting symptoms, physical exam, and planned diagnostics and interventions. Attending physician stated agreement with plan or made changes to plan which were implemented.   Reevaluation:  After the interventions noted above I re-evaluated patient and found that they have :resolved   Social Determinants of Health:      Problem List / ED Course:  Vague symptoms - some light headedness, fatigue. No stroke sx and neuro exam normal by me and prior PA. CTA head/neck is NML. Hydrated and treated UTI. I had a shared decsion making conversation with pt - offered additional obs but she declined. She feels well and is ambulating and neuro intact.  Will treat UTI and rec close FU and strict return precautions.    Dispostion:  After consideration of the diagnostic results and the patients response to treatment,  I feel that the patent would benefit from discharge home     Tedd Sias, [place] 01/10/23 1708    Jeanell Sparrow, DO 01/13/23 [phone]

## 2023-01-08 NOTE — Discharge Instructions (Addendum)
Hydrate 8 glasses of water minimum per day  Please use Tylenol or ibuprofen for pain.  You may use 600 mg ibuprofen every 6 hours or 1000 mg of Tylenol every 6 hours.  You may choose to alternate between the 2.  This would be most effective.  Not to exceed 4 g of Tylenol within 24 hours.  Not to exceed 3200 mg ibuprofen 24 hours.   Please take all of your antibiotics until finished!   You may develop abdominal discomfort or diarrhea from the antibiotic.  You may help offset this with probiotics which you can buy or get in yogurt. Do not eat  or take the probiotics until 2 hours after your antibiotic.

## 2023-01-08 NOTE — ED Provider Notes (Signed)
[facility] Provider Note   CSN: [id number] Arrival date & time: 01/08/23  1218     History  Chief Complaint  Patient presents with   Dizziness    [name] Costa is a 42 y.o. female. With past medical history of basal ganglia stroke, asthma, tobacco use who presents to the emergency department with lightheadedness.   She states that yesterday around 9:30-10am she was seated at her home office when she states she was having difficulty typing. She states it was like she "forgot how to type and was making a lot of mistakes." She states after this she began feeling lightheaded and then had hand tremors. She states she got up to grab water and called her PCP who was to see her this morning. At the PCP she again began to feel lightheaded and they recommended she come to the ED for evaluation. She endorses having mild frontal headache. She denies changes to her vision, dizziness, neck pain, head or neck trauma, numbness/tingling or weakness to her extremities, slurred speech, facial droop, nausea or vomiting, chest pain, shortness of breath or syncope.    Dizziness      Home Medications Prior to Admission medications   Medication Sig Start Date End Date Taking? Authorizing Provider  acetaminophen (TYLENOL) 325 MG tablet Take 2 tablets (650 mg total) by mouth every 4 (four) hours as needed for mild pain (or temp > 37.5 C (99.5 F)). 06/07/21   Vonzella Nipple, NP  amLODipine (NORVASC) 5 MG tablet Take 1 tablet (5 mg total) by mouth daily. 08/08/21 08/08/22  Izora Ribas, MD  aspirin 81 MG chewable tablet Chew 1 tablet (81 mg total) by mouth daily. 06/08/21   Vonzella Nipple, NP  atorvastatin (LIPITOR) 40 MG tablet Take 1 tablet (40 mg total) by mouth daily. 02/05/22   Garvin Fila, MD  methocarbamol (ROBAXIN) 500 MG tablet Take 1 tablet (500 mg total) by mouth every 6 (six) hours as needed for muscle spasms. 09/18/22   Charlesetta Shanks, MD   methylPREDNISolone (MEDROL DOSEPAK) 4 MG TBPK tablet Per Dosepak instructions. 09/18/22   Charlesetta Shanks, MD  naproxen sodium (ANAPROX DS) 550 MG tablet Take 1 tablet (550 mg total) by mouth 2 (two) times daily with a meal. 04/02/22   White, Leitha Schuller, NP      Allergies    Latex    Review of Systems   Review of Systems  Neurological:  Positive for tremors and light-headedness.  Psychiatric/Behavioral:  Positive for confusion.   All other systems reviewed and are negative.   Physical Exam Updated Vital Signs BP 133/88   Pulse 76   Resp 17   Ht 5' 11"$  (1.803 m)   Wt 102.1 kg   SpO2 100%   BMI 31.38 kg/m  Physical Exam Vitals and nursing note reviewed.  Constitutional:      General: She is not in acute distress.    Appearance: Normal appearance. She is not ill-appearing.  HENT:     Head: Normocephalic.     Mouth/Throat:     Mouth: Mucous membranes are moist.     Pharynx: Oropharynx is clear.  Eyes:     General: No scleral icterus.    Extraocular Movements: Extraocular movements intact.     Pupils: Pupils are equal, round, and reactive to light.  Cardiovascular:     Rate and Rhythm: Normal rate and regular rhythm.     Pulses: Normal pulses.  Heart sounds: No murmur heard. Pulmonary:     Effort: Pulmonary effort is normal. No respiratory distress.     Breath sounds: Normal breath sounds.  Abdominal:     General: Bowel sounds are normal.     Palpations: Abdomen is soft.  Musculoskeletal:        General: Normal range of motion.     Cervical back: Neck supple.  Skin:    General: Skin is warm and dry.     Capillary Refill: Capillary refill takes less than 2 seconds.  Neurological:     General: No focal deficit present.     Mental Status: She is alert and oriented to person, place, and time. Mental status is at baseline.     GCS: GCS eye subscore is 4. GCS verbal subscore is 5. GCS motor subscore is 6.     Cranial Nerves: Cranial nerves 2-12 are intact.      Motor: Motor function is intact. No weakness, tremor or pronator drift.     Coordination: Coordination is intact. Finger-Nose-Finger Test and Heel to [place] Test normal.     Comments: Has subjective sensation change to left cheek which she states is residual from previous stroke   Psychiatric:        Mood and Affect: Mood normal.        Behavior: Behavior normal.        Thought Content: Thought content normal.        Judgment: Judgment normal.     ED Results / Procedures / Treatments   Labs (all labs ordered are listed, but only abnormal results are displayed) Labs Reviewed  COMPREHENSIVE METABOLIC PANEL - Abnormal; Notable for the following components:      Result Value   Albumin 3.3 (*)    All other components within normal limits  CBC WITH DIFFERENTIAL/PLATELET - Abnormal; Notable for the following components:   Hemoglobin 11.6 (*)    RDW 16.0 (*)    All other components within normal limits  CBG MONITORING, ED    EKG EKG Interpretation  Date/Time:  Thursday January 08 2023 14:00:41 EST Ventricular Rate:  65 PR Interval:  156 QRS Duration: 88 QT Interval:  396 QTC Calculation: 412 R Axis:   64 Text Interpretation: Sinus rhythm Confirmed by Dene Gentry [phone] on 01/08/2023 3:01:07 PM  Radiology No results found.  Procedures Procedures   Medications Ordered in ED Medications - No data to display  ED Course/ Medical Decision Making/ A&P Clinical Course as of 01/08/23 1627  Thu Jan 08, 2023  1624 Yesterday a few episodes took a nap and felt better. This AM had a LH episode at the MD office.  [WF]    Clinical Course User Index [WF] Tedd Sias, [place]   Care of patient handed off to [facility], PA-C at shift change. Patient pending CTA head neck for lightheadedness and symptoms concerning for TIA yesterday. After imaging will likely need to touch base with neurology for ultimate disposition. Please see his note for completion of care.  Final Clinical  Impression(s) / ED Diagnoses Final diagnoses:  None    Rx / DC Orders ED Discharge Orders     None         Mickie Hillier, PA-C 01/08/23 1628    Valarie Merino, MD 01/10/23 1200

## 2023-01-08 NOTE — ED Triage Notes (Signed)
Patient came to ED POV from home due to dizziness and lightheadness starting yesterday morning. Patient has history of stroke, patient states she does not have deficits. Patient denies chest pain, SOB, N/V/D. Patient states she's had a cough for a couple days with a little mucous production. Patient is A&Ox4.

## 2023-02-02 ENCOUNTER — Emergency Department (HOSPITAL_COMMUNITY)
Admission: EM | Admit: 2023-02-02 | Discharge: 2023-02-02 | Disposition: A | Discharge status: Home / Self Care | Payer: 59 | Attending: Emergency Medicine | Admitting: Emergency Medicine

## 2023-02-02 ENCOUNTER — Encounter (HOSPITAL_COMMUNITY): Payer: Self-pay

## 2023-02-02 ENCOUNTER — Emergency Department (HOSPITAL_COMMUNITY): Payer: 59

## 2023-02-02 ENCOUNTER — Other Ambulatory Visit: Payer: Self-pay

## 2023-02-02 DIAGNOSIS — R0789 Other chest pain: Secondary | ICD-10-CM | POA: Diagnosis not present

## 2023-02-02 DIAGNOSIS — E871 Hypo-osmolality and hyponatremia: Secondary | ICD-10-CM | POA: Diagnosis not present

## 2023-02-02 DIAGNOSIS — Z79899 Other long term (current) drug therapy: Secondary | ICD-10-CM | POA: Diagnosis not present

## 2023-02-02 DIAGNOSIS — Z1152 Encounter for screening for COVID-19: Secondary | ICD-10-CM | POA: Insufficient documentation

## 2023-02-02 DIAGNOSIS — E876 Hypokalemia: Secondary | ICD-10-CM

## 2023-02-02 DIAGNOSIS — Z7982 Long term (current) use of aspirin: Secondary | ICD-10-CM | POA: Insufficient documentation

## 2023-02-02 DIAGNOSIS — Z720 Tobacco use: Secondary | ICD-10-CM

## 2023-02-02 DIAGNOSIS — Z91040 Latex allergy status: Secondary | ICD-10-CM | POA: Insufficient documentation

## 2023-02-02 DIAGNOSIS — I10 Essential (primary) hypertension: Secondary | ICD-10-CM | POA: Diagnosis not present

## 2023-02-02 LAB — I-STAT BETA HCG BLOOD, ED (MC, WL, AP ONLY): I-stat hCG, quantitative: <5 m[IU]/mL (ref <5)

## 2023-02-02 LAB — CBC
HCT: 36.5 % (ref 36.0–46.0)
Hemoglobin: 11.2 g/dL — ABNORMAL LOW (ref 12.0–15.0)
MCH: 26 pg (ref 26.0–34.0)
MCHC: 30.7 g/dL (ref 30.0–36.0)
MCV: 84.9 fL (ref 80.0–100.0)
Platelets: 304 10*3/uL (ref 150–400)
RBC: 4.3 MIL/uL (ref 3.87–5.11)
RDW: 15.5 % (ref 11.5–15.5)
WBC: 10.5 10*3/uL (ref 4.0–10.5)
nRBC: 0 % (ref 0.0–0.2)

## 2023-02-02 LAB — BASIC METABOLIC PANEL
Anion gap: 4 — ABNORMAL LOW (ref 5–15)
BUN: 6 mg/dL (ref 6–20)
CO2: 24 mmol/L (ref 22–32)
Calcium: 8.4 mg/dL — ABNORMAL LOW (ref 8.9–10.3)
Chloride: 105 mmol/L (ref 98–111)
Creatinine, Ser: 0.78 mg/dL (ref 0.44–1.00)
GFR, Estimated: >60 mL/min (ref >60)
Glucose, Bld: 153 mg/dL — ABNORMAL HIGH (ref 70–99)
Potassium: 3 mmol/L — ABNORMAL LOW (ref 3.5–5.1)
Sodium: 133 mmol/L — ABNORMAL LOW (ref 135–145)

## 2023-02-02 LAB — RESP PANEL BY RT-PCR (RSV, FLU A&B, COVID)  RVPGX2
Influenza A by PCR: NEGATIVE
Influenza B by PCR: NEGATIVE
Resp Syncytial Virus by PCR: NEGATIVE
SARS Coronavirus 2 by RT PCR: NEGATIVE

## 2023-02-02 LAB — TROPONIN I (HIGH SENSITIVITY)
Troponin I (High Sensitivity): 3 ng/L (ref <18)
Troponin I (High Sensitivity): 4 ng/L (ref <18)

## 2023-02-02 MED ORDER — IBUPROFEN 600 MG PO TABS: 600.0000 mg | ORAL_TABLET | Freq: Three times a day (TID) | ORAL | 0 refills | Status: AC | PRN

## 2023-02-02 MED ORDER — POTASSIUM CHLORIDE 10 MEQ/100ML IV SOLN
10.0000 meq | Freq: Once | INTRAVENOUS | Status: AC
  Administered 2023-02-02: 10 meq via INTRAVENOUS
  Filled 2023-02-02: qty 100

## 2023-02-02 MED ORDER — METHOCARBAMOL 500 MG PO TABS: 500.0000 mg | ORAL_TABLET | Freq: Three times a day (TID) | ORAL | 0 refills | Status: AC | PRN

## 2023-02-02 MED ORDER — POTASSIUM CHLORIDE CRYS ER 20 MEQ PO TBCR
40.0000 meq | EXTENDED_RELEASE_TABLET | Freq: Once | ORAL | Status: AC
  Administered 2023-02-02: 40 meq via ORAL
  Filled 2023-02-02: qty 2

## 2023-02-02 NOTE — ED Provider Triage Note (Signed)
Emergency Medicine Provider Triage Evaluation Note  Kennidi L Felch , a 42 y.o. female  was evaluated in triage.  Pt complains of left chest pain radiating to L shoulder x 2 days.  She denies shortness of breath, fever, chills, cough, congestion, nausea, vomiting, diarrhea, diaphoresis, radiating pain, dizziness, lightheadedness, or syncope.  She states that pain is worse with touching the chest and moving her left arm.  States that pain has been persistent for the last 2 days.  She denies history of CAD/MI.  Her mother did have an MI in her 29s.  Review of Systems  Positive: See HPI Negative: See HPI  Physical Exam  BP (!) 141/92   Pulse 69   Temp 98.1 F (36.7 C)   Resp 18   Ht '5\' 11"'$  (1.803 m)   Wt 102.1 kg   SpO2 100%   BMI 31.39 kg/m  Gen:   Awake, no distress   Resp:  Normal effort lungs clear to auscultation MSK:   Moves extremities without difficulty no lower extremity edema or tenderness Other:  Regular rate and rhythm, left chest wall is tender to palpation  Medical Decision Making  Medically screening exam initiated at 10:27 AM.  Appropriate orders placed.  Tiwanna L Mcdill was informed that the remainder of the evaluation will be completed by another provider, this initial triage assessment does not replace that evaluation, and the importance of remaining in the ED until their evaluation is complete.     Suzzette Righter, PA-C 02/02/23 1032

## 2023-02-02 NOTE — Discharge Instructions (Signed)
Thank you for letting us take care of you today.  Your workup today was reassuring and we did not see any damage to your heart.  The chest pain that you are having is reproducible when we touch your chest and when you move your arm and this is most consistent with musculoskeletal pain.  With this, I am prescribing you a small amount of muscle relaxers and NSAIDs to take over the next few days to help with your pain.  I want you to follow-up with your primary care provider for recheck in the next few days if you are not getting better.  With your many risk factors, I also am referring you to cardiology for further evaluation considering you have had multiple episodes of chest pain in the past.  Please discuss with your primary care provider at your next appointment if you should keep this appointment with cardiology.  If you develop any worsening symptoms such as severe chest pain, difficulty breathing, lightheadedness, dizziness, loss of consciousness, or any other new, concerning symptoms, please be reevaluated in the nearest emergency department.

## 2023-02-02 NOTE — ED Triage Notes (Signed)
Pt c/o left sided chest pain that radiates to left shoulderx2d. Pt denies N/V, SOB.

## 2023-02-02 NOTE — ED Provider Notes (Signed)
[facility] Provider Note   CSN: [id number] Arrival date & time: 02/02/23  [id number]     History  Chief Complaint  Patient presents with   Chest Pain    [name] Costa is a 42 year old female with past medical history CVA, hypertension who complains of left chest pain radiating to L shoulder x 2 days.  Pain has been constant since that time.  She denies shortness of breath, fever, chills, cough, congestion, nausea, vomiting, diarrhea, diaphoresis, radiating pain, dizziness, lightheadedness, leg pain or swelling, or syncope.  She states that pain is worse with touching the chest and moving her left arm.  States that pain has been persistent and has not stopped for the last 2 days.  She denies history of CAD/MI.  Her mother did have an MI in her 26s. No recent change in activity or injury to the chest.  She is on a daily baby aspirin due to her history of CVA.  She does not have any residual deficits from prior CVA.       Home Medications Prior to Admission medications   Medication Sig Start Date End Date Taking? Authorizing Provider  ibuprofen (ADVIL) 600 MG tablet Take 1 tablet (600 mg total) by mouth every 8 (eight) hours as needed for up to 3 days for mild pain or moderate pain. 02/02/23 02/05/23 Yes Jolleen Seman L, PA-C  acetaminophen (TYLENOL) 325 MG tablet Take 2 tablets (650 mg total) by mouth every 4 (four) hours as needed for mild pain (or temp > 37.5 C (99.5 F)). 06/07/21   Vonzella Nipple, NP  amLODipine (NORVASC) 5 MG tablet Take 1 tablet (5 mg total) by mouth daily. 08/08/21 08/08/22  Izora Ribas, MD  aspirin 81 MG chewable tablet Chew 1 tablet (81 mg total) by mouth daily. 06/08/21   Vonzella Nipple, NP  atorvastatin (LIPITOR) 40 MG tablet Take 1 tablet (40 mg total) by mouth daily. 02/05/22   Garvin Fila, MD  methocarbamol (ROBAXIN) 500 MG tablet Take 1 tablet (500 mg total) by mouth 3 (three) times daily as needed for up to 3  days for muscle spasms. 02/02/23 02/05/23  Ethel Veronica L, PA-C  methylPREDNISolone (MEDROL DOSEPAK) 4 MG TBPK tablet Per Dosepak instructions. 09/18/22   Charlesetta Shanks, MD  naproxen sodium (ANAPROX DS) 550 MG tablet Take 1 tablet (550 mg total) by mouth 2 (two) times daily with a meal. 04/02/22   White, Leitha Schuller, NP  nitrofurantoin, macrocrystal-monohydrate, (MACROBID) 100 MG capsule Take 1 capsule (100 mg total) by mouth 2 (two) times daily. 01/08/23   Tedd Sias, PA      Allergies    Latex    Review of Systems   Review of Systems  All other systems reviewed and are negative.   Physical Exam Updated Vital Signs BP (!) 152/94   Pulse 61   Temp 98.1 F (36.7 C) (Oral)   Resp 19   Ht '5\' 11"'$  (1.803 m)   Wt 102.1 kg   LMP 01/04/2023 (Approximate)   SpO2 100%   BMI 31.39 kg/m  Physical Exam Vitals and nursing note reviewed.  Constitutional:      General: She is not in acute distress.    Appearance: Normal appearance. She is not ill-appearing, toxic-appearing or diaphoretic.  HENT:     Head: Normocephalic and atraumatic.     Mouth/Throat:     Mouth: Mucous membranes are moist.  Eyes:  Extraocular Movements: Extraocular movements intact.     Conjunctiva/sclera: Conjunctivae normal.  Cardiovascular:     Rate and Rhythm: Normal rate and regular rhythm.     Heart sounds: Normal heart sounds. No murmur heard.    No S3 or S4 sounds.  Pulmonary:     Effort: Pulmonary effort is normal. No tachypnea or respiratory distress.     Breath sounds: Normal breath sounds. No stridor. No decreased breath sounds, wheezing, rhonchi or rales.  Chest:     Chest wall: Tenderness (moderate to L anterior chest wall) present. No mass, crepitus or edema.  Abdominal:     General: Abdomen is flat.     Palpations: Abdomen is soft.     Tenderness: There is no abdominal tenderness. There is no guarding or rebound.  Musculoskeletal:        General: Normal range of motion.     Cervical back:  Normal range of motion and neck supple.     Right lower leg: No tenderness. No edema.     Left lower leg: No tenderness. No edema.  Skin:    General: Skin is warm and dry.     Capillary Refill: Capillary refill takes less than 2 seconds.  Neurological:     General: No focal deficit present.     Mental Status: She is alert and oriented to person, place, and time. Mental status is at baseline.     Cranial Nerves: No cranial nerve deficit.     Motor: No weakness.  Psychiatric:        Mood and Affect: Mood normal.        Behavior: Behavior normal.     ED Results / Procedures / Treatments   Labs (all labs ordered are listed, but only abnormal results are displayed) Labs Reviewed  BASIC METABOLIC PANEL - Abnormal; Notable for the following components:      Result Value   Sodium 133 (*)    Potassium 3.0 (*)    Glucose, Bld 153 (*)    Calcium 8.4 (*)    Anion gap 4 (*)    All other components within normal limits  CBC - Abnormal; Notable for the following components:   Hemoglobin 11.2 (*)    All other components within normal limits  RESP PANEL BY RT-PCR (RSV, FLU A&B, COVID)  RVPGX2  I-STAT BETA HCG BLOOD, ED ([facility], [facility], [facility] ONLY)  TROPONIN I (HIGH SENSITIVITY)  TROPONIN I (HIGH SENSITIVITY)    EKG EKG Interpretation  Date/Time:  Monday February 02 2023 10:01:00 EST Ventricular Rate:  67 PR Interval:  150 QRS Duration: 70 QT Interval:  422 QTC Calculation: 445 R Axis:   70 Text Interpretation: Normal sinus rhythm with sinus arrhythmia Normal ECG When compared with ECG of 08-Jan-2023 14:00, PREVIOUS ECG IS PRESENT No significant change since last tracing Confirmed by Pattricia Boss [phone]) on 02/02/2023 11:09:22 AM  Radiology DG Chest 2 View  Result Date: 02/02/2023 CLINICAL DATA:  Chest pain EXAM: CHEST - 2 VIEW COMPARISON:  CT examination dated September 18, 2022 FINDINGS: The heart size and mediastinal contours are within normal limits. Both lungs are clear. The visualized skeletal  structures are unremarkable. IMPRESSION: No active cardiopulmonary disease. Electronically Signed   By: Keane Police D.O.   On: 02/02/2023 10:54    Procedures Procedures    Medications Ordered in ED Medications  potassium chloride SA (KLOR-CON M) CR tablet 40 mEq (40 mEq Oral Given 02/02/23 1238)  potassium chloride 10 mEq in 100  mL IVPB (0 mEq Intravenous Stopped 02/02/23 1437)    ED Course/ Medical Decision Making/ A&P                             Medical Decision Making Amount and/or Complexity of Data Reviewed Labs: ordered. Decision-making details documented in ED Course. Radiology: ordered. Decision-making details documented in ED Course. ECG/medicine tests: ordered. Decision-making details documented in ED Course.  Risk Prescription drug management. Decision regarding hospitalization.   Medical Decision Making:   Shaniece L Finamore is a 42 y.o. female who presented to the ED today with chest pain detailed above.    Patient's presentation is complicated by their history of previous CVA, recurrent chest pain, MI in mother in 62s.  Patient placed on continuous vitals and telemetry monitoring while in ED which was reviewed periodically.  Complete initial physical exam performed, notably the patient  was in no acute distress with regular rate and rhythm, lungs clear to auscultation, no lower extremity edema or tenderness, and with reproducible chest wall tenderness to the left chest wall.  Abdomen is soft, nontender, without rebound, guarding, pulsatile masses, or peritoneal signs.    Reviewed and confirmed nursing documentation for past medical history, family history, social history.    HEART Score for Major Cardiac Events from [email or web address]  on 02/02/2023 ** All calculations should be rechecked by clinician prior to use **  RESULT SUMMARY: 2 points Low Score (0-3 points)  Risk of MACE of 0.9-1.7%.   INPUTS: History --> 0 = Slightly suspicious EKG --> 0 = Normal Age --> 0 =  <45 Risk factors --> 2 = ?3 risk factors or history of atherosclerotic disease Initial troponin --> 0 = ?normal limit   Wells' Criteria for Pulmonary Embolism from [email or web address]  on 02/02/2023 ** All calculations should be rechecked by clinician prior to use **  RESULT SUMMARY: 0.0 points Low risk group: 1.3% chance of PE in an ED population.   Another study assigned scores ? 4 as "PE Unlikely" and had a 3% incidence of PE.   INPUTS: Clinical signs and symptoms of DVT --> 0 = No PE is #1 diagnosis OR equally likely --> 0 = No Heart rate > 100 --> 0 = No Immobilization at least 3 days OR surgery in the previous 4 weeks --> 0 = No Previous, objectively diagnosed PE or DVT --> 0 = No Hemoptysis --> 0 = No Malignancy w/ treatment within 6 months or palliative --> 0 = No   Initial Assessment:   With the patient's presentation of chest wall, most likely diagnosis is musculoskeletal pain. Differential diagnosis includes but is not limited to ACS, viral illness, muscle strain, pneumonia, pleural effusion, fracture, anxiety, DVT/PE, anemia. This is most consistent with an acute complicated illness  Initial Plan:  Screening labs including CBC and Metabolic panel to evaluate for infectious or metabolic etiology of disease.  CXR and troponin to evaluate for structural/infectious intrathoracic pathology. Viral swabs  EKG to evaluate for cardiac pathology Objective evaluation as reviewed   Initial Study Results:   Laboratory  All laboratory results reviewed without evidence of clinically relevant pathology.   Exceptions include: Sodium 133, potassium 3.0, glucose 153  EKG EKG was reviewed independently. ST segments without concerns for elevations.   EKG: normal EKG, normal sinus rhythm.   Radiology:  All images reviewed independently. Agree with radiology report at this time.   DG Chest 2 View  Result Date: 02/02/2023 CLINICAL  DATA:  Chest pain EXAM: CHEST - 2 VIEW COMPARISON:  CT  examination dated September 18, 2022 FINDINGS: The heart size and mediastinal contours are within normal limits. Both lungs are clear. The visualized skeletal structures are unremarkable. IMPRESSION: No active cardiopulmonary disease. Electronically Signed   By: Keane Police D.O.   On: 02/02/2023 10:54   CT Angio Head Neck W WO CM  Result Date: 01/08/2023 CLINICAL DATA:  Transient ischemic attack. EXAM: CT HEAD WITHOUT CONTRAST CT ANGIOGRAPHY OF THE HEAD AND NECK TECHNIQUE: Contiguous axial images were obtained from the base of the skull through the vertex without intravenous contrast. Multidetector CT imaging of the head and neck was performed using the standard protocol during bolus administration of intravenous contrast. Multiplanar CT image reconstructions and MIPs were obtained to evaluate the vascular anatomy. Carotid stenosis measurements (when applicable) are obtained utilizing NASCET criteria, using the distal internal carotid diameter as the denominator. RADIATION DOSE REDUCTION: This exam was performed according to the departmental dose-optimization program which includes automated exposure control, adjustment of the mA and/or kV according to patient size and/or use of iterative reconstruction technique. CONTRAST:  8m OMNIPAQUE IOHEXOL 350 MG/ML SOLN COMPARISON:  Head CT 06/05/2021.  MRI brain 06/04/2021. FINDINGS: CT HEAD Brain: Old lacunar infarct in the posterior aspect of the right lentiform nucleus and corona radiata. No acute intracranial hemorrhage. Cortical gray-white differentiation is preserved. No hydrocephalus or extra-axial collection. Basilar cisterns are patent. Vascular: No hyperdense vessel or unexpected calcification. 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 Sinuses/Orbits: Moderate mucosal disease in the maxillary sinuses. Orbits are unremarkable. CTA NECK Aortic arch: Three-vessel arch  configuration. Arch vessel origins are patent. Right carotid system: The common and internal carotid arteries are patent to the skull base without stenosis, aneurysm or dissection. Left carotid system: The common and internal carotid arteries are patent to the skull base without stenosis, aneurysm or dissection. Vertebral arteries:Patent from the origin to the confluence with the basilar without stenosis or dissection. Skeleton: Unremarkable. Other neck: Unremarkable. CTA HEAD Anterior circulation: Intracranial ICAs are patent without stenosis or aneurysm. The proximal ACAs and MCAs are patent without stenosis or aneurysm. Distal branches are symmetric. Posterior circulation: Normal basilar artery. The SCAs, AICAs and PICAs are patent proximally. The PCAs are patent proximally without stenosis or aneurysm. Distal branches are symmetric. Venous sinuses: Patent. Anatomic variants: Azygous A2 segment of the ACAs. IMPRESSION: 1. No acute intracranial abnormality. 2. No large vessel occlusion, hemodynamically significant stenosis, or evidence of dissection. Electronically Signed   By: WEmmit AlexandersM.D.   On: 01/08/2023 18:25      Final Assessment and Plan:   This is a 42year old female presenting to the ED for left chest pain for the last 2 days.  She reports history of multiple episodes of similar chest pain no previous diagnosis of CAD.  She does have multiple risk factors including hypertension, previous CVA, MI in her mom when she was in her 56s tobacco use.  On exam, patient's pain is reproducible to palpation of the left chest wall and with movement of the left upper extremity.  With her risk factors, however, workup obtained as above for further evaluation and to rule out ACS.  Patient did have hypokalemia and hyponatremia.  Potassium repleted while in the ED.  Remainder of workup essentially unremarkable including normal EKG, negative troponin x 2, normal chest x-ray.  Previous echocardiogram in 2022  with normal EF with  essentially no abnormalities.  Patient's pain remains reproducible on reexamination.  Discussed case with attending MD who also evaluated patient and agrees with management including discharge home.  Patient updated on all findings and discharge plan.  As patient has had recurrent chest pain in the past with multiple risk factors, we will refer to cardiology for outpatient follow-up.  Short course of muscle relaxers and NSAIDs for symptomatic relief at home.  Tobacco cessation counseling provided.  Patient states that she will discuss further management of tobacco cessation with her primary care provider.  Patient given strict ED return precautions, all questions answered, and stable for discharge.   Clinical Impression:  1. Chest wall pain   2. Hypokalemia   3. Hyponatremia      Discharge           Final Clinical Impression(s) / ED Diagnoses Final diagnoses:  Hypokalemia  Hyponatremia  Chest wall pain    Rx / DC Orders ED Discharge Orders          Ordered    methocarbamol (ROBAXIN) 500 MG tablet  3 times daily PRN        02/02/23 1511    ibuprofen (ADVIL) 600 MG tablet  Every 8 hours PRN        02/02/23 1511    Ambulatory referral to Cardiology       Comments: If you have not heard from the Cardiology office within the next 72 hours please call [phone].   02/02/23 1513              Suzzette Righter, PA-C 02/02/23 1539    Pattricia Boss, MD 02/04/23 1450

## 2023-02-17 ENCOUNTER — Telehealth: Payer: Self-pay | Admitting: Neurology

## 2023-02-17 NOTE — Telephone Encounter (Signed)
LVM and sent text msg informing pt of need to reschedule 02/18/23 appointment - MD out

## 2023-02-18 ENCOUNTER — Ambulatory Visit: Payer: 59 | Admitting: Neurology

## 2023-02-20 ENCOUNTER — Other Ambulatory Visit: Payer: Self-pay | Admitting: Neurology

## 2023-03-09 NOTE — Progress Notes (Deleted)
Cardiology Office Note:    Date:  03/09/2023   ID:  [name] Costa, DOB 01-22-1981, MRN [id number]  PCP:  [facility]   [facility] Providers Cardiologist:  None { Click to update primary MD,subspecialty MD or APP then REFRESH:1}    Referring MD: [facility]   CC: *** Consulted for the evaluation of *** at the behest of ***   History of Present Illness:    [name] Costa is a 42 y.o. female with a hx of prior stroke, tobacco abuse, with chest wall pain.  Patient notes that (s)he is feeling ***.   Was last feeling well ***. Able to ***  Has had no chest pain, chest pressure, chest tightness, chest stinging ***.  Discomfort occurs with ***, worsens with ***, and improves with ***.    Patient exertion notable for *** with *** and feels no symptoms.    No shortness of breath, DOE ***.  No PND or orthopnea***.  No weight gain***, leg swelling ***, or abdominal swelling***.  No syncope or near syncope ***. Notes *** no palpitations or funny heart beats.     Patient reports prior cardiac testing including ***  No history of ***pre-eclampsia, gestation HTN or gestational DM.  No Fen-Phen or drug use***.  Ambulatory BP ***.   Past Medical History:  Diagnosis Date   Bladder infection    Chest wall pain    Stroke (HCC) 06/04/2021   Trichomonas     Past Surgical History:  Procedure Laterality Date   BUBBLE STUDY  06/06/2021   Procedure: BUBBLE STUDY;  Surgeon: Little IshikawaSchumann, Christopher L, MD;  [facility];  Service: Cardiovascular;;   CHOLECYSTECTOMY     CHOLECYSTECTOMY     TEE WITHOUT CARDIOVERSION N/A 06/06/2021   Procedure: TRANSESOPHAGEAL ECHOCARDIOGRAM (TEE);  Surgeon: Little IshikawaSchumann, Christopher L, MD;  [facility];  Service: Cardiovascular;  Laterality: N/A;   TUBAL LIGATION Bilateral 04/13/2013   Procedure: POST PARTUM TUBAL LIGATION;  Surgeon: Bing Plumehomas F Henley, MD;  Location: [facility] ORS;  Service: Gynecology;   Laterality: Bilateral;    Current Medications: No outpatient medications have been marked as taking for the 03/11/23 encounter (Appointment) with Christell Constanthandrasekhar, Chonte Ricke A, MD.     Allergies:   Latex   Social History   Socioeconomic History   Marital status: Divorced    Spouse name: Not on file   Number of children: Not on file   Years of education: Not on file   Highest education level: Not on file  Occupational History   Not on file  Tobacco Use   Smoking status: Never   Smokeless tobacco: Never  Substance and Sexual Activity   Alcohol use: No   Drug use: No   Sexual activity: Not on file  Other Topics Concern   Not on file  Social History Narrative   Not on file   Social Determinants of Health   Financial Resource Strain: Not on file  Food Insecurity: Not on file  Transportation Needs: Not on file  Physical Activity: Not on file  Stress: Not on file  Social Connections: Not on file     Family History: The patient's ***family history is not on file.  ROS:   Please see the history of present illness.    *** All other systems reviewed and are negative.  EKGs/Labs/Other Studies Reviewed:    The following studies were reviewed today: ***  EKG:  EKG is *** ordered today.  The  ekg ordered today demonstrates ***  Cardiac Studies & Procedures       ECHOCARDIOGRAM  ECHOCARDIOGRAM COMPLETE BUBBLE STUDY 06/04/2021  Narrative ECHOCARDIOGRAM REPORT    Patient Name:   [name] Costa Date of Exam: 06/04/2021 Medical Rec #:  [id number]        Height:       67.0 in Accession #:    [id number]       Weight:       224.4 lb Date of Birth:  [birth year]        BSA:          2.124 m Patient Age:    40 years         BP:           145/88 mmHg Patient Gender: F                HR:           90 bpm. Exam Location:  Inpatient  Procedure: 2D Echo, Cardiac Doppler, Color Doppler and Saline Contrast Bubble Study  Indications:    Stroke 434.91 / I63.9  History:        Patient  has no prior history of Echocardiogram examinations.  Sonographer:    Renella Cunas RDCS Referring Phys: [id number] [facility]  IMPRESSIONS   1. Left ventricular ejection fraction, by estimation, is 65 to 70%. The left ventricle has normal function. The left ventricle has no regional wall motion abnormalities. Left ventricular diastolic parameters were normal. 2. Right ventricular systolic function is normal. The right ventricular size is normal. 3. The mitral valve is normal in structure. No evidence of mitral valve regurgitation. No evidence of mitral stenosis. 4. The aortic valve is tricuspid. Aortic valve regurgitation is not visualized. No aortic stenosis is present. 5. The inferior vena cava is normal in size with greater than 50% respiratory variability, suggesting right atrial pressure of 3 mmHg. 6. Agitated saline contrast bubble study was negative, with no evidence of any interatrial shunt.  Comparison(s): No prior Echocardiogram.  Conclusion(s)/Recommendation(s): Otherwise normal echocardiogram, with minor abnormalities described in the report.  FINDINGS Left Ventricle: Left ventricular ejection fraction, by estimation, is 65 to 70%. The left ventricle has normal function. The left ventricle has no regional wall motion abnormalities. The left ventricular internal cavity size was normal in size. There is no left ventricular hypertrophy. Left ventricular diastolic parameters were normal.  Right Ventricle: The right ventricular size is normal. No increase in right ventricular wall thickness. Right ventricular systolic function is normal.  Left Atrium: Left atrial size was normal in size.  Right Atrium: Right atrial size was normal in size.  Pericardium: There is no evidence of pericardial effusion.  Mitral Valve: The mitral valve is normal in structure. No evidence of mitral valve regurgitation. No evidence of mitral valve stenosis.  Tricuspid Valve: The tricuspid valve is  normal in structure. Tricuspid valve regurgitation is not demonstrated.  Aortic Valve: The aortic valve is tricuspid. Aortic valve regurgitation is not visualized. No aortic stenosis is present.  Pulmonic Valve: The pulmonic valve was normal in structure. Pulmonic valve regurgitation is not visualized. No evidence of pulmonic stenosis.  Aorta: The aortic root is normal in size and structure.  Venous: The inferior vena cava is normal in size with greater than 50% respiratory variability, suggesting right atrial pressure of 3 mmHg.  IAS/Shunts: The atrial septum is grossly normal. Agitated saline contrast was given intravenously to evaluate for intracardiac shunting. Agitated saline contrast bubble  study was negative, with no evidence of any interatrial shunt.   LEFT VENTRICLE PLAX 2D LVIDd:         4.80 cm      Diastology LVIDs:         3.30 cm      LV e' medial:    8.16 cm/s LV PW:         0.80 cm      LV E/e' medial:  9.5 LV IVS:        0.80 cm      LV e' lateral:   13.20 cm/s LVOT diam:     1.90 cm      LV E/e' lateral: 5.9 LV SV:         59 LV SV Index:   28 LVOT Area:     2.84 cm  LV Volumes (MOD) LV vol d, MOD A2C: 125.0 ml LV vol d, MOD A4C: 129.0 ml LV vol s, MOD A2C: 46.4 ml LV vol s, MOD A4C: 46.4 ml LV SV MOD A2C:     78.6 ml LV SV MOD A4C:     129.0 ml LV SV MOD BP:      80.1 ml  RIGHT VENTRICLE RV S prime:     13.50 cm/s TAPSE (M-mode): 2.4 cm  LEFT ATRIUM             Index       RIGHT ATRIUM           Index LA diam:        3.30 cm 1.55 cm/m  RA Area:     13.10 cm LA Vol (A2C):   40.9 ml 19.26 ml/m RA Volume:   31.50 ml  14.83 ml/m LA Vol (A4C):   22.4 ml 10.55 ml/m LA Biplane Vol: 32.6 ml 15.35 ml/m AORTIC VALVE LVOT Vmax:   97.50 cm/s LVOT Vmean:  70.700 cm/s LVOT VTI:    0.208 m  AORTA Ao Root diam: 2.80 cm  MITRAL VALVE MV Area (PHT): 4.04 cm    SHUNTS MV Decel Time: 188 msec    Systemic VTI:  0.21 m MV E velocity: 77.60 cm/s  Systemic  Diam: 1.90 cm MV A velocity: 59.20 cm/s MV E/A ratio:  1.31  Riley Lam MD Electronically signed by Riley Lam MD Signature Date/Time: 06/04/2021/11:11:48 AM    Final   TEE  ECHO TEE 06/06/2021  Narrative TRANSESOPHOGEAL ECHO REPORT    Patient Name:   LIBBI RATHORE Date of Exam: 06/06/2021 Medical Rec #:  [id number]        Height:       67.0 in Accession #:    [id number]       Weight:       224.4 lb Date of Birth:  [birth year]        BSA:          2.124 m Patient Age:    40 years         BP:           141/77 mmHg Patient Gender: F                HR:           127 bpm. Exam Location:  Inpatient  Procedure: 2D Echo  Indications:    stroke  History:        Patient has no prior history of Echocardiogram examinations. Risk Factors:Current Smoker and Hypertension.  Sonographer:    Delcie Roch Referring  Phys: [id number] CALLIE E GOODRICH  PROCEDURE: After discussion of the risks and benefits of a TEE, an informed consent was obtained from the patient. The transesophogeal probe was passed without difficulty through the esophogus of the patient. Imaged were obtained with the patient in a left lateral decubitus position. Local oropharyngeal anesthetic was provided with Cetacaine. Sedation performed by different physician. The patient was monitored while under deep sedation. Anesthestetic sedation was provided intravenously by Anesthesiology: 400mg  of Propofol, 80mg  of Lidocaine. The patient developed no complications during the procedure.  IMPRESSIONS   1. Left ventricular ejection fraction, by estimation, is 60 to 65%. The left ventricle has normal function. 2. Right ventricular systolic function is normal. The right ventricular size is normal. 3. No left atrial/left atrial appendage thrombus was detected. 4. The mitral valve is normal in structure. Trivial mitral valve regurgitation. 5. The aortic valve is tricuspid. Aortic valve regurgitation is not  visualized. No aortic stenosis is present. 6. Agitated saline contrast bubble study was positive with shunting observed within 3-6 cardiac cycles suggestive of interatrial shunt. Suggests small patent foramen ovale.  FINDINGS Left Ventricle: Left ventricular ejection fraction, by estimation, is 60 to 65%. The left ventricle has normal function. The left ventricular internal cavity size was normal in size.  Right Ventricle: The right ventricular size is normal. No increase in right ventricular wall thickness. Right ventricular systolic function is normal.  Left Atrium: Left atrial size was normal in size. No left atrial/left atrial appendage thrombus was detected.  Right Atrium: Right atrial size was normal in size.  Pericardium: There is no evidence of pericardial effusion.  Mitral Valve: The mitral valve is normal in structure. Trivial mitral valve regurgitation.  Tricuspid Valve: The tricuspid valve is normal in structure. Tricuspid valve regurgitation is trivial.  Aortic Valve: The aortic valve is tricuspid. Aortic valve regurgitation is not visualized. No aortic stenosis is present.  Pulmonic Valve: The pulmonic valve was grossly normal. Pulmonic valve regurgitation is trivial.  Aorta: The aortic root and ascending aorta are structurally normal, with no evidence of dilitation.  IAS/Shunts: No atrial level shunt detected by color flow Doppler. Agitated saline contrast was given intravenously to evaluate for intracardiac shunting. Agitated saline contrast bubble study was positive with shunting observed within 3-6 cardiac cycles suggestive of interatrial shunt. A small patent foramen ovale is detected.  Epifanio Lesches MD Electronically signed by Epifanio Lesches MD Signature Date/Time: 06/06/2021/3:51:04 PM    Final             Recent Labs: 01/08/2023: ALT 12 02/02/2023: BUN 6; Creatinine, Ser 0.78; Hemoglobin 11.2; Platelets 304; Potassium 3.0; Sodium 133  Recent Lipid  Panel    Component Value Date/Time   CHOL 188 06/05/2021 0150   TRIG 88 06/05/2021 0150   HDL 55 06/05/2021 0150   CHOLHDL 3.4 06/05/2021 0150   VLDL 18 06/05/2021 0150   LDLCALC 115 (H) 06/05/2021 0150     Risk Assessment/Calculations:   {Does this patient have ATRIAL FIBRILLATION?:[id number]}  No BP recorded.  {Refresh Note OR Click here to enter BP  :1}***         Physical Exam:    VS:  There were no vitals taken for this visit.    Wt Readings from Last 3 Encounters:  02/02/23 225 lb 1.4 oz (102.1 kg)  01/08/23 225 lb (102.1 kg)  09/18/22 220 lb (99.8 kg)     GEN: *** Well nourished, well developed in no acute distress HEENT: Normal NECK: No JVD; No  carotid bruits LYMPHATICS: No lymphadenopathy CARDIAC: ***RRR, no murmurs, rubs, gallops RESPIRATORY:  Clear to auscultation without rales, wheezing or rhonchi  ABDOMEN: Soft, non-tender, non-distended MUSCULOSKELETAL:  No edema; No deformity  SKIN: Warm and dry NEUROLOGIC:  Alert and oriented x 3 PSYCHIATRIC:  Normal affect   ASSESSMENT:    No diagnosis found. PLAN:    Prior stroke PFO -seen by Dr. Herminio Heads you ordering a CV Procedure (e.g. stress test, cath, DCCV, TEE, etc)?   Press F2        :[id number]}    Medication Adjustments/Labs and Tests Ordered: Current medicines are reviewed at length with the patient today.  Concerns regarding medicines are outlined above.  No orders of the defined types were placed in this encounter.  No orders of the defined types were placed in this encounter.   There are no Patient Instructions on file for this visit.   Signed, Christell Constant, MD  03/09/2023 10:41 AM    [facility]

## 2023-03-10 ENCOUNTER — Encounter: Payer: Self-pay | Admitting: Internal Medicine

## 2023-03-11 ENCOUNTER — Ambulatory Visit: Payer: 59 | Admitting: Internal Medicine

## 2023-03-20 ENCOUNTER — Other Ambulatory Visit: Payer: Self-pay | Admitting: Family Medicine

## 2023-03-20 DIAGNOSIS — Z1231 Encounter for screening mammogram for malignant neoplasm of breast: Secondary | ICD-10-CM

## 2023-04-29 ENCOUNTER — Ambulatory Visit: Payer: 59 | Attending: Internal Medicine | Admitting: Cardiovascular Disease

## 2023-05-07 ENCOUNTER — Ambulatory Visit
Admission: RE | Admit: 2023-05-07 | Discharge: 2023-05-07 | Disposition: A | Discharge status: Home / Self Care | Payer: 59 | Source: Ambulatory Visit | Attending: Family Medicine | Admitting: Family Medicine

## 2023-05-07 DIAGNOSIS — Z1231 Encounter for screening mammogram for malignant neoplasm of breast: Secondary | ICD-10-CM

## 2023-05-08 ENCOUNTER — Other Ambulatory Visit: Payer: Self-pay | Admitting: Family Medicine

## 2023-05-08 DIAGNOSIS — R928 Other abnormal and inconclusive findings on diagnostic imaging of breast: Secondary | ICD-10-CM

## 2023-05-20 ENCOUNTER — Ambulatory Visit
Admission: RE | Admit: 2023-05-20 | Discharge: 2023-05-20 | Disposition: A | Discharge status: Home / Self Care | Payer: 59 | Source: Ambulatory Visit | Attending: Family Medicine | Admitting: Family Medicine

## 2023-05-20 DIAGNOSIS — R928 Other abnormal and inconclusive findings on diagnostic imaging of breast: Secondary | ICD-10-CM

## 2023-05-21 ENCOUNTER — Other Ambulatory Visit: Payer: Self-pay | Admitting: Family Medicine

## 2023-05-21 DIAGNOSIS — N6310 Unspecified lump in the right breast, unspecified quadrant: Secondary | ICD-10-CM

## 2023-05-25 ENCOUNTER — Ambulatory Visit
Admission: RE | Admit: 2023-05-25 | Discharge: 2023-05-25 | Disposition: A | Discharge status: Home / Self Care | Payer: 59 | Source: Ambulatory Visit | Attending: Family Medicine | Admitting: Family Medicine

## 2023-05-25 DIAGNOSIS — N6310 Unspecified lump in the right breast, unspecified quadrant: Secondary | ICD-10-CM

## 2023-05-25 HISTORY — PX: BREAST BIOPSY: SHX20

## 2023-05-26 ENCOUNTER — Other Ambulatory Visit: Payer: Self-pay | Admitting: Family Medicine

## 2023-05-26 DIAGNOSIS — N6310 Unspecified lump in the right breast, unspecified quadrant: Secondary | ICD-10-CM

## 2023-05-28 ENCOUNTER — Encounter: Payer: Self-pay | Admitting: Obstetrics and Gynecology

## 2023-05-28 ENCOUNTER — Other Ambulatory Visit (HOSPITAL_COMMUNITY)
Admission: RE | Admit: 2023-05-28 | Discharge: 2023-05-28 | Disposition: A | Discharge status: Home / Self Care | Payer: 59 | Source: Ambulatory Visit | Attending: Obstetrics and Gynecology | Admitting: Obstetrics and Gynecology

## 2023-05-28 ENCOUNTER — Ambulatory Visit (INDEPENDENT_AMBULATORY_CARE_PROVIDER_SITE_OTHER): Payer: 59 | Admitting: Obstetrics and Gynecology

## 2023-05-28 VITALS — BP 136/89 | HR 64 | Ht 71.0 in | Wt 229.8 lb

## 2023-05-28 DIAGNOSIS — Z113 Encounter for screening for infections with a predominantly sexual mode of transmission: Secondary | ICD-10-CM

## 2023-05-28 DIAGNOSIS — Z01419 Encounter for gynecological examination (general) (routine) without abnormal findings: Secondary | ICD-10-CM

## 2023-05-28 NOTE — Progress Notes (Signed)
GYNECOLOGY ANNUAL PREVENTATIVE CARE ENCOUNTER NOTE  History:     [name] Costa is a 42 y.o. [phone] female here for a routine annual gynecologic exam.  Current complaints: none.   Denies abnormal vaginal bleeding, discharge, pelvic pain, problems with intercourse or other gynecologic concerns.   Pt notes hx of "stroke."  Pt states she was worked up and does did not have a vascular or hypertensive incident.  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  No hx of multiple sclerosis.   Gynecologic History Patient's last menstrual period was 05/07/2023. Contraception: tubal ligation Last Pap: no results in chart or care everywhere Last mammogram: 6/624. Results were: incomplete, pt has since had breast biopsy and has follow up 12/24  Obstetric History OB History  Gravida Para Term Preterm AB Living  5 2 2   3 2   SAB IAB Ectopic Multiple Live Births  1 2     1     # Outcome Date GA Lbr Len/2nd Weight Sex Delivery Anes PTL Lv  5 Term 04/12/13 [redacted]w[redacted]d 17:15 / 00:50 7 lb 8.5 oz (3.415 kg) M Vag-Spont EPI  LIV  4 IAB           3 IAB           2 SAB           1 Term             Past Medical History:  Diagnosis Date   Bladder infection    Chest wall pain    Stroke (HCC) 06/04/2021   Trichomonas     Past Surgical History:  Procedure Laterality Date   BREAST BIOPSY Right 05/25/2023   [place] RT BREAST BX W LOC DEV 1ST LESION IMG BX SPEC US GUIDE 05/25/2023 [facility] MAMMOGRAPHY   BUBBLE STUDY  06/06/2021   Procedure: BUBBLE STUDY;  Surgeon: [name], MD;  [facility];  Service: Cardiovascular;;   CHOLECYSTECTOMY     CHOLECYSTECTOMY     TEE WITHOUT CARDIOVERSION N/A 06/06/2021   Procedure: TRANSESOPHAGEAL ECHOCARDIOGRAM (TEE);  Surgeon: [name], MD;  Location: [facility] ENDOSCOPY;  Service: Cardiovascular;  Laterality: N/A;   TUBAL LIGATION Bilateral 04/13/2013   Procedure: POST PARTUM TUBAL LIGATION;  Surgeon: Bing Plume, MD;  Location: [facility] ORS;   Service: Gynecology;  Laterality: Bilateral;    Current Outpatient Medications on File Prior to Visit  Medication Sig Dispense Refill   aspirin 81 MG chewable tablet Chew 81 mg by mouth daily.     escitalopram (LEXAPRO) 5 MG tablet Take 5 mg by mouth daily.     acetaminophen (TYLENOL) 325 MG tablet Take 2 tablets (650 mg total) by mouth every 4 (four) hours as needed for mild pain (or temp > 37.5 C (99.5 F)). 30 tablet 0   amLODipine (NORVASC) 5 MG tablet Take 1 tablet (5 mg total) by mouth daily. 30 tablet 11   atorvastatin (LIPITOR) 40 MG tablet Take 1 tablet by mouth once daily (Patient not taking: Reported on 05/28/2023) 90 tablet 0   methylPREDNISolone (MEDROL DOSEPAK) 4 MG TBPK tablet Per Dosepak instructions. (Patient not taking: Reported on 05/28/2023) 21 tablet 0   naproxen sodium (ANAPROX DS) 550 MG tablet Take 1 tablet (550 mg total) by mouth 2 (two) times daily with a meal. (Patient not taking: Reported on 05/28/2023) 30 tablet 0   nitrofurantoin, macrocrystal-monohydrate, (MACROBID) 100 MG capsule Take 1 capsule (100 mg total) by mouth 2 (two) times daily. (Patient  not taking: Reported on 05/28/2023) 10 capsule 0   No current facility-administered medications on file prior to visit.    Allergies  Allergen Reactions   Latex Rash    Social History:  reports that she has never smoked. She has never used smokeless tobacco. She reports that she does not drink alcohol and does not use drugs.  Family History  Problem Relation Age of Onset   Breast cancer Maternal Grandmother     The following portions of the patient's history were reviewed and updated as appropriate: allergies, current medications, past family history, past medical history, past social history, past surgical history and problem list.  Review of Systems Pertinent items noted in HPI and remainder of comprehensive ROS otherwise negative.  Physical Exam:  BP 136/89   Pulse 64   Ht 5\' 11"  (1.803 m)   Wt 229 lb 12.8  oz (104.2 kg)   LMP 05/07/2023   Breastfeeding No   BMI 32.05 kg/m  CONSTITUTIONAL: Well-developed, well-nourished female in no acute distress.  HENT:  Normocephalic, atraumatic, External right and left ear normal. Oropharynx is clear and moist EYES: Conjunctivae and EOM are normal. NECK: Normal range of motion, supple, no masses.  Normal thyroid.  SKIN: Skin is warm and dry. No rash noted. Not diaphoretic. No erythema. No pallor. MUSCULOSKELETAL: Normal range of motion. No tenderness.  No cyanosis, clubbing, or edema.  2+ distal pulses. NEUROLOGIC: Alert and oriented to person, place, and time. Normal reflexes, muscle tone coordination.  PSYCHIATRIC: Normal mood and affect. Normal behavior. Normal judgment and thought content. CARDIOVASCULAR: Normal heart rate noted, regular rhythm RESPIRATORY: Clear to auscultation bilaterally. Effort and breath sounds normal, no problems with respiration noted. BREASTS: deferred ABDOMEN: Soft, no distention noted.  No tenderness, rebound or guarding. Laparoscopy scars noted. PELVIC: Normal appearing external genitalia and urethral meatus; normal appearing vaginal mucosa and cervix.  No abnormal discharge noted.  Pap smear obtained.  Vaginal swab noted.  Normal uterine size, no other palpable masses, no uterine or adnexal tenderness.  Performed in the presence of a chaperone.   Assessment and Plan:    1. Routine screening for STI (sexually transmitted infection) Per pt request  - HIV Antibody (routine testing w rflx) - RPR - Hepatitis C Antibody - Hepatitis B Surface AntiGEN - Cervicovaginal ancillary only( [place])  2. Women's annual routine gynecological examination Normal annual exam  - Cytology - PAP( [place])  Will follow up results of pap smear and manage accordingly. Mammogram recently done with follow up scheduled through radiology Routine preventative health maintenance measures emphasized. Please refer to After Visit Summary  for other counseling recommendations.     F/u in 1 year or prn  Mariel Aloe, MD, FACOG Obstetrician & Gynecologist, [facility] for [employer], [facility]

## 2023-05-28 NOTE — Progress Notes (Signed)
Patient presents for AEX. Last Pap: Pt reports about 2 years ago, no hx of abnormal pap Last Mammogram: screening 05/07/2023 Cycle/Contraception: Has regular, monthly cycles, had BTL 2014 Vaginal/Urinary Symptoms: Denies symptoms today STD Screen: Desires vaginal swab, and blood work Other Concerns: Has hx of PCOS

## 2023-05-29 LAB — CERVICOVAGINAL ANCILLARY ONLY
Bacterial Vaginitis (gardnerella): POSITIVE — AB
Candida Glabrata: NEGATIVE
Candida Vaginitis: NEGATIVE
Chlamydia: NEGATIVE
Comment: NEGATIVE
Comment: NEGATIVE
Comment: NEGATIVE
Comment: NEGATIVE
Comment: NEGATIVE
Comment: NORMAL
Neisseria Gonorrhea: NEGATIVE
Trichomonas: POSITIVE — AB

## 2023-05-29 LAB — HEPATITIS B SURFACE ANTIGEN: Hepatitis B Surface Ag: NEGATIVE

## 2023-05-29 LAB — HIV ANTIBODY (ROUTINE TESTING W REFLEX): HIV Screen 4th Generation wRfx: NONREACTIVE

## 2023-05-29 LAB — RPR: RPR Ser Ql: NONREACTIVE

## 2023-05-29 LAB — HEPATITIS C ANTIBODY: Hep C Virus Ab: NONREACTIVE

## 2023-06-02 ENCOUNTER — Other Ambulatory Visit: Payer: Self-pay | Admitting: Neurology

## 2023-06-02 ENCOUNTER — Encounter: Payer: Self-pay | Admitting: Anesthesiology

## 2023-06-03 ENCOUNTER — Other Ambulatory Visit: Payer: Self-pay | Admitting: Anesthesiology

## 2023-06-03 ENCOUNTER — Other Ambulatory Visit: Payer: Self-pay

## 2023-06-03 ENCOUNTER — Telehealth: Payer: Self-pay

## 2023-06-03 DIAGNOSIS — A599 Trichomoniasis, unspecified: Secondary | ICD-10-CM

## 2023-06-03 LAB — CYTOLOGY - PAP
Comment: NEGATIVE
Diagnosis: UNDETERMINED — AB
High risk HPV: NEGATIVE

## 2023-06-03 MED ORDER — ATORVASTATIN CALCIUM 40 MG PO TABS: 40.0000 mg | ORAL_TABLET | Freq: Every day | ORAL | 3 refills | Status: DC

## 2023-06-03 MED ORDER — METRONIDAZOLE 500 MG PO TABS
500.0000 mg | ORAL_TABLET | Freq: Two times a day (BID) | ORAL | 0 refills | Status: AC
Start: 2023-06-03 — End: 2023-06-10

## 2023-06-03 NOTE — Telephone Encounter (Signed)
I connected with  [name] Costa on 06/03/23 by telephone and verified that I am speaking with the correct person using two identifiers.  Informed patient of cervicovaginal ancillary results. Advised patient Trichomonas is an STD so patient should let partner(s) know and abstain from unprotected intercourse until appropriate treatment has been taking. Informed patient a test of cure is recommended in 6-8 weeks.  Flagyl 500mg  po bid x7 days sent to preferred pharmacy.    Patient has no further questions at this time.

## 2023-06-03 NOTE — Telephone Encounter (Signed)
-----   Message from Warden Fillers, MD sent at 06/01/2023 11:48 AM EDT ----- [name] noted on swab, offer treatment

## 2023-08-31 ENCOUNTER — Ambulatory Visit (INDEPENDENT_AMBULATORY_CARE_PROVIDER_SITE_OTHER): Payer: 59

## 2023-08-31 ENCOUNTER — Encounter: Payer: Self-pay | Admitting: Rheumatology

## 2023-08-31 ENCOUNTER — Ambulatory Visit: Payer: 59 | Attending: Rheumatology | Admitting: Rheumatology

## 2023-08-31 VITALS — BP 144/78 | HR 77 | Resp 17 | Ht 70.25 in | Wt 232.2 lb

## 2023-08-31 DIAGNOSIS — F17200 Nicotine dependence, unspecified, uncomplicated: Secondary | ICD-10-CM

## 2023-08-31 DIAGNOSIS — Z862 Personal history of diseases of the blood and blood-forming organs and certain disorders involving the immune mechanism: Secondary | ICD-10-CM

## 2023-08-31 DIAGNOSIS — Z8673 Personal history of transient ischemic attack (TIA), and cerebral infarction without residual deficits: Secondary | ICD-10-CM

## 2023-08-31 DIAGNOSIS — R768 Other specified abnormal immunological findings in serum: Secondary | ICD-10-CM | POA: Diagnosis not present

## 2023-08-31 DIAGNOSIS — M2550 Pain in unspecified joint: Secondary | ICD-10-CM

## 2023-08-31 DIAGNOSIS — F419 Anxiety disorder, unspecified: Secondary | ICD-10-CM

## 2023-08-31 DIAGNOSIS — M25571 Pain in right ankle and joints of right foot: Secondary | ICD-10-CM | POA: Diagnosis not present

## 2023-08-31 DIAGNOSIS — E559 Vitamin D deficiency, unspecified: Secondary | ICD-10-CM | POA: Diagnosis not present

## 2023-08-31 DIAGNOSIS — F32A Depression, unspecified: Secondary | ICD-10-CM

## 2023-08-31 DIAGNOSIS — Z8709 Personal history of other diseases of the respiratory system: Secondary | ICD-10-CM

## 2023-08-31 DIAGNOSIS — M25521 Pain in right elbow: Secondary | ICD-10-CM

## 2023-08-31 DIAGNOSIS — G8929 Other chronic pain: Secondary | ICD-10-CM

## 2023-08-31 DIAGNOSIS — I10 Essential (primary) hypertension: Secondary | ICD-10-CM

## 2023-08-31 DIAGNOSIS — M25572 Pain in left ankle and joints of left foot: Secondary | ICD-10-CM

## 2023-08-31 MED ORDER — TRIAMCINOLONE ACETONIDE 40 MG/ML IJ SUSP
30.0000 mg | INTRAMUSCULAR | Status: AC | PRN
Start: 2023-08-31 — End: 2023-08-31
  Administered 2023-08-31: 30 mg via INTRA_ARTICULAR

## 2023-08-31 MED ORDER — LIDOCAINE HCL 1 % IJ SOLN
1.0000 mL | INTRAMUSCULAR | Status: AC | PRN
Start: 2023-08-31 — End: 2023-08-31
  Administered 2023-08-31: 1 mL

## 2023-08-31 NOTE — Patient Instructions (Signed)
Tennis Elbow Rehab Ask your health care provider which exercises are safe for you. Do exercises exactly as told by your health care provider and adjust them as directed. It is normal to feel mild stretching, pulling, tightness, or discomfort as you do these exercises. Stop right away if you feel sudden pain or your pain gets worse. Do not begin these exercises until told by your health care provider. Stretching and range-of-motion exercises These exercises warm up your muscles and joints and improve the movement and flexibility of your elbow. Wrist flexion, assisted  Straighten your left / right elbow in front of you with your palm facing down toward the floor. If told by your health care provider, bend your left / right elbow to a 90-degree angle (right angle) at your side instead of holding it straight. With your other hand, gently push over the back of your left / right hand so your fingers point toward the floor (flexion). Stop when you feel a gentle stretch on the back of your forearm. Hold this position for __________ seconds. Repeat __________ times. Complete this exercise __________ times a day. Wrist extension, assisted  Straighten your left / right elbow in front of you with your palm facing up toward the ceiling. If told by your health care provider, bend your left / right elbow to a 90-degree angle (right angle) at your side instead of holding it straight. With your other hand, gently pull your left / right hand and fingers toward the floor (extension). Stop when you feel a gentle stretch on the palm side of your forearm. Hold this position for __________ seconds. Repeat __________ times. Complete this exercise __________ times a day. Assisted forearm rotation, supination Sit or stand with your elbows at your side. Bend your left / right elbow to a 90-degree angle (right angle). Using your uninjured hand, turn your left / right palm up toward the ceiling (supination) until you feel a  gentle stretch along the inside of your forearm. Hold this position for __________ seconds. Repeat __________ times. Complete this exercise __________ times a day. Assisted forearm rotation, pronation Sit or stand with your elbows at your side. Bend your left / right elbow to a 90-degree angle (right angle). Using your uninjured hand, turn your left / right palm down toward the floor (pronation) until you feel a gentle stretch along the outside of your forearm. Hold this position for __________ seconds. Repeat __________ times. Complete this exercise __________ times a day. Strengthening exercises These exercises build strength and endurance in your forearm and elbow. Endurance is the ability to use your muscles for a long time, even after they get tired. Radial deviation  Stand with a __________ weight or a hammer in your left / right hand. Or, sit while holding a rubber exercise band or tubing, with your left / right forearm supported on a table or countertop. Position your forearm so that the thumb is facing the ceiling, as if you are going to clap your hands. This is the neutral position. Raise your hand upward in front of you so your thumb moves toward the ceiling (radial deviation), or pull up on the rubber tubing. Keep your forearm and elbow still while you move your wrist only. Hold this position for __________ seconds. Slowly return to the starting position. Repeat __________ times. Complete this exercise __________ times a day. Wrist extension, eccentric Sit with your left / right forearm palm-down and supported on a table or other surface. Let your left /  right wrist extend over the edge of the surface. Hold a __________ weight or a piece of exercise band or tubing in your left / right hand. If using a rubber exercise band or tubing, hold the other end of the tubing with your other hand. Use your uninjured hand to move your left / right hand up toward the ceiling. Take your  uninjured hand away and slowly return to the starting position using only your left / right hand. Lowering your arm under tension is called eccentric extension. Repeat __________ times. Complete this exercise __________ times a day. Wrist extension Do not do this exercise if it causes pain at the outside of your elbow. Only do this exercise once instructed by your health care provider. Sit with your left / right forearm supported on a table or other surface and your palm turned down toward the floor. Let your left / right wrist extend over the edge of the surface. Hold a __________ weight or a piece of rubber exercise band or tubing. If you are using a rubber exercise band or tubing, hold the band or tubing in place with your other hand to provide resistance. Slowly bend your wrist so your hand moves up toward the ceiling (extension). Move only your wrist, keeping your forearm and elbow still. Hold this position for __________ seconds. Slowly return to the starting position. Repeat __________ times. Complete this exercise __________ times a day. Forearm rotation, supination To do this exercise, you will need a lightweight hammer or rubber mallet. Sit with your left / right forearm supported on a table or other surface. Bend your elbow to a 90-degree angle (right angle). Position your forearm so that your palm is facing down toward the floor, with your hand resting over the edge of the table. Hold a hammer in your left / right hand. To make this exercise easier, hold the hammer near the head of the hammer. To make this exercise harder, hold the hammer near the end of the handle. Without moving your wrist or elbow, slowly rotate your forearm so your palm faces up toward the ceiling (supination). Hold this position for __________ seconds. Slowly return to the starting position. Repeat __________ times. Complete this exercise __________ times a day. Shoulder blade squeeze Sit in a stable chair or  stand with good posture. If you are sitting down, do not let your back touch the back of the chair. Your arms should be at your sides with your elbows bent to a 90-degree angle (right angle). Position your forearms so that your thumbs are facing the ceiling (neutral position). Without lifting your shoulders up, squeeze your shoulder blades tightly together. Hold this position for __________ seconds. Slowly release and return to the starting position. Repeat __________ times. Complete this exercise __________ times a day. This information is not intended to replace advice given to you by your health care provider. Make sure you discuss any questions you have with your health care provider. Document Revised: 02/06/2020 Document Reviewed: 02/08/2020 Elsevier Patient Education  2024 [employer] Tennis Elbow  Tennis elbow (lateral epicondylitis) is inflammation of tendons in your outer forearm, near your elbow. Tendons are tissues that connect muscle to bone. When you have tennis elbow, inflammation affects the tendons that you use to bend your wrist and move your hand up. Inflammation occurs in the lower part of the upper arm bone (humerus), where the tendons connect to the bone (lateral epicondyle). Tennis elbow often affects people who play tennis, but anyone  may get the condition from repeatedly extending the wrist or turning the forearm. What are the causes? This condition is usually caused by repeatedly extending the wrist, turning the forearm, and using the hands. It can result from sports or work that requires repetitive forearm movements. In some cases, it may be caused by a sudden injury. What increases the risk? You are more likely to develop tennis elbow if you play tennis or another racket sport. You also have a higher risk if you frequently use your hands for work. Besides people who play tennis, others at greater risk include: People who use computers. Holiday representative workers. People who  work in [employer]. Musicians. Cooks. Cashiers. What are the signs or symptoms? Symptoms of this condition include: Pain and tenderness in the forearm and the outer part of the elbow. Pain may be felt only when using the arm, or it may be there all the time. A burning feeling that starts in the elbow and spreads down the forearm. A weak grip in the hand. How is this diagnosed? This condition is diagnosed based on your symptoms, your medical history, and a physical exam. You may also have X-rays or an MRI to: Confirm the diagnosis. Look for other issues. Check for tears in the ligaments, muscles, or tendons. How is this treated? Resting and icing your arm is often the first treatment. Your health care provider may also recommend: Medicines to reduce pain and inflammation. These may be in the form of a pill, topical gels, or shots of a steroid medicine (cortisone). An elbow strap to reduce stress on the area. Physical therapy. This may include massage or exercises or both. An elbow brace to restrict the movements that cause symptoms. If these treatments do not help relieve your symptoms, your health care provider may recommend surgery to remove damaged muscle and reattach healthy muscle to bone. Follow these instructions at home: If you have a brace or strap: Wear the brace or strap as told by your health care provider. Remove it only as told by your health care provider. Check the skin around the brace or strap every day. Tell your health care provider about any concerns. Loosen the brace if your fingers tingle, become numb, or turn cold and blue. Keep the brace clean. If the brace or strap is not waterproof: Do not let it get wet. Cover it with a watertight covering when you take a bath or a shower. Managing pain, stiffness, and swelling  If directed, put ice on the injured area. To do this: If you have a removable brace or strap, remove it as told by your health care provider. Put  ice in a plastic bag. Place a towel between your skin and the bag. Leave the ice on for 20 minutes, 2-3 times a day. Remove the ice if your skin turns bright red. This is very important. If you cannot feel pain, heat, or cold, you have a greater risk of damage to the area. Move your fingers often to reduce stiffness and swelling. Activity Rest your elbow and wrist and avoid activities that cause symptoms as told by your health care provider. Do physical therapy exercises as told by your health care provider. If you lift an object, lift it with your palm facing up. This reduces stress on your elbow. Lifestyle If your tennis elbow is caused by sports, check your equipment and make sure that: You use it correctly. It is good match for you. If your tennis elbow is caused by  work or computer use, take frequent breaks to stretch your arm. Talk with your employer about ways to manage your condition at work. General instructions Take over-the-counter and prescription medicines only as told by your health care provider. Do not use any products that contain nicotine or tobacco. These products include cigarettes, chewing tobacco, and vaping devices, such as e-cigarettes. If you need help quitting, ask your health care provider. Keep all follow-up visits. This is important. How is this prevented? Before and after activity: Warm up and stretch before being active. Cool down and stretch after being active. Give your body time to rest between periods of activity. During activity: Make sure to use equipment that fits you. If you play tennis, put power in your stroke with your lower body. Avoid using your arm only. Maintain physical fitness, including: Strength. Flexibility. Endurance. Do exercises to strengthen the forearm muscles. Contact a health care provider if: You have pain that gets worse or does not get better with treatment. You have numbness or weakness in your forearm, hand, or  fingers. Get help right away if: Your pain is severe. You cannot move your wrist. Summary Tennis elbow (lateral epicondylitis) is inflammation of tendons in your outer forearm, near your elbow. Common symptoms include pain and tenderness in your forearm and the outer part of your elbow. This condition is usually caused by repeatedly extending your wrist, turning your forearm, and using your hands. The first treatment is often resting and icing your arm to relieve symptoms. Further treatment may include taking medicine, getting physical therapy, wearing a brace or strap, or having surgery. This information is not intended to replace advice given to you by your health care provider. Make sure you discuss any questions you have with your health care provider. Document Revised: 05/29/2020 Document Reviewed: 05/29/2020 Elsevier Patient Education  2024 [employer].

## 2023-09-02 LAB — BETA-2 GLYCOPROTEIN ANTIBODIES
Beta-2 Glyco 1 IgA: 10 U/mL (ref <20.0)
Beta-2 Glyco 1 IgM: >112 U/mL — ABNORMAL HIGH (ref <20.0)
Beta-2 Glyco I IgG: 9.6 U/mL (ref <20.0)

## 2023-09-02 LAB — RNP ANTIBODY: Ribonucleic Protein(ENA) Antibody, IgG: <1 AI

## 2023-09-02 LAB — CARDIOLIPIN ANTIBODIES, IGG, IGM, IGA
Anticardiolipin IgA: 11.2 [APL'U]/mL (ref <20.0)
Anticardiolipin IgG: 8.4 [GPL'U]/mL (ref <20.0)
Anticardiolipin IgM: >112 [MPL'U]/mL — ABNORMAL HIGH (ref <20.0)

## 2023-09-02 LAB — ANTI-SMITH ANTIBODY: ENA SM Ab Ser-aCnc: <1 AI

## 2023-09-02 LAB — LUPUS ANTICOAGULANT EVAL W/ REFLEX
PTT-LA Screen: 42 s — ABNORMAL HIGH (ref <=40)
dRVVT: 35 s (ref <=45)

## 2023-09-02 LAB — RHEUMATOID FACTOR: Rheumatoid fact SerPl-aCnc: 12 [IU]/mL (ref <14)

## 2023-09-02 LAB — PROTEIN / CREATININE RATIO, URINE
Creatinine, Urine: 442 mg/dL — ABNORMAL HIGH (ref 20–275)
Protein/Creat Ratio: 45 mg/g{creat} (ref 24–184)
Protein/Creatinine Ratio: 0.045 mg/mg{creat} (ref 0.024–0.184)
Total Protein, Urine: 20 mg/dL (ref 5–24)

## 2023-09-02 LAB — C3 AND C4
C3 Complement: 120 mg/dL (ref 83–193)
C4 Complement: 17 mg/dL (ref 15–57)

## 2023-09-02 LAB — SJOGRENS SYNDROME-B EXTRACTABLE NUCLEAR ANTIBODY: SSB (La) (ENA) Antibody, IgG: <1 AI

## 2023-09-02 LAB — SJOGRENS SYNDROME-A EXTRACTABLE NUCLEAR ANTIBODY: SSA (Ro) (ENA) Antibody, IgG: <1 AI

## 2023-09-02 LAB — ANTI-DNA ANTIBODY, DOUBLE-STRANDED: ds DNA Ab: <1 [IU]/mL

## 2023-09-02 LAB — ANTI-SCLERODERMA ANTIBODY: Scleroderma (Scl-70) (ENA) Antibody, IgG: <1 AI

## 2023-09-02 LAB — RFLX HEXAGONAL PHASE CONFIRM: Hexagonal Phase Conf: NEGATIVE

## 2023-09-03 ENCOUNTER — Encounter: Payer: 59 | Admitting: Rheumatology

## 2023-09-17 NOTE — Progress Notes (Signed)
Office Visit Note  Patient: [name] Costa             Date of Birth: [birth year]           MRN: [id number]             PCP: [facility] Referring: [facility]* Visit Date: 10/01/2023 Occupation: @GUAROCC @  Subjective:  No chief complaint on file.   History of Present Illness: [name] Costa is a 42 y.o. female with positive ANA and right lateral epicondylitis.  She had right lateral epicondyle injection at the last visit on August 31, 2023.  She has been also wearing a tennis elbow brace which has been helpful.  She states that the pain is still there but has eased off.  None of the other joints are painful.  She has some morning stiffness which she feels in her elbows, shoulders and her ankles.  She has not noticed any joint swelling.  There is no history of oral ulcers, nasal ulcers, sicca symptoms, malar rash, Raynaud's, lymphadenopathy or inflammatory arthritis.    Activities of Daily Living:  Patient reports morning stiffness for 5  minutes to all day. Patient Reports nocturnal pain.  Difficulty dressing/grooming: Denies Difficulty climbing stairs: Denies Difficulty getting out of chair: Denies Difficulty using hands for taps, buttons, cutlery, and/or writing: Denies  Review of Systems  Constitutional:  Negative for fatigue.  HENT:  Negative for mouth sores and mouth dryness.   Eyes:  Negative for dryness.  Respiratory:  Negative for shortness of breath.   Cardiovascular:  Negative for chest pain and palpitations.  Gastrointestinal:  Negative for blood in stool, constipation and diarrhea.  Endocrine: Negative for increased urination.  Genitourinary:  Negative for involuntary urination.  Musculoskeletal:  Positive for joint pain, gait problem, joint pain, myalgias, morning stiffness, muscle tenderness and myalgias. Negative for joint swelling and muscle weakness.  Skin:  Negative for color change, rash, hair loss and sensitivity  to sunlight.  Allergic/Immunologic: Negative for susceptible to infections.  Neurological:  Positive for headaches. Negative for dizziness.  Hematological:  Negative for swollen glands.  Psychiatric/Behavioral:  Negative for depressed mood and sleep disturbance. The patient is not nervous/anxious.     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   TOBACCO ABUSE 10/15/2009   ALLERGIC RHINITIS 10/15/2009   ASTHMA 10/15/2009    Past Medical History:  Diagnosis Date   Bladder infection    Chest wall pain    PCOS (polycystic ovarian syndrome)    Stroke (HCC) 06/04/2021   Trichomonas     Family History  Problem Relation Age of Onset   Hypertension Mother    Hypertension Father    Hypertension Sister    Hypertension Brother    Breast cancer Maternal Grandmother    Healthy Daughter    Healthy Son    Autism Son    Past Surgical History:  Procedure Laterality Date   BREAST BIOPSY Right 05/25/2023   [place] RT BREAST BX W LOC DEV 1ST LESION IMG BX SPEC US GUIDE 05/25/2023 [facility] MAMMOGRAPHY   BUBBLE STUDY  06/06/2021   Procedure: BUBBLE STUDY;  Surgeon: [name], MD;  Location: [facility] ENDOSCOPY;  Service: Cardiovascular;;   CHOLECYSTECTOMY     CHOLECYSTECTOMY     COLONOSCOPY  2006   TEE WITHOUT CARDIOVERSION N/A 06/06/2021   Procedure: TRANSESOPHAGEAL  ECHOCARDIOGRAM (TEE);  Surgeon: [name], MD;  Location: [facility] ENDOSCOPY;  Service: Cardiovascular;  Laterality: N/A;   TUBAL LIGATION Bilateral 04/13/2013   Procedure: POST PARTUM TUBAL LIGATION;  Surgeon: Bing Plume, MD;  Location: [facility] ORS;  Service: Gynecology;  Laterality: Bilateral;   Social History   Social History Narrative   Not on file   There is no immunization history for the selected administration types on file for this patient.   Objective: Vital Signs: BP (!) 143/89 (BP Location:  Left Arm, Patient Position: Sitting, Cuff Size: Normal)   Pulse 61   Resp 17   Ht 5\' 10"  (1.778 m)   Wt 227 lb (103 kg)   BMI 32.57 kg/m    Physical Exam Vitals and nursing note reviewed.  Constitutional:      Appearance: She is well-developed.  HENT:     Head: Normocephalic and atraumatic.  Eyes:     Conjunctiva/sclera: Conjunctivae normal.  Cardiovascular:     Rate and Rhythm: Normal rate and regular rhythm.     Heart sounds: Normal heart sounds.  Pulmonary:     Effort: Pulmonary effort is normal.     Breath sounds: Normal breath sounds.  Abdominal:     General: Bowel sounds are normal.     Palpations: Abdomen is soft.  Musculoskeletal:     Cervical back: Normal range of motion.  Lymphadenopathy:     Cervical: No cervical adenopathy.  Skin:    General: Skin is warm and dry.     Capillary Refill: Capillary refill takes less than 2 seconds.  Neurological:     Mental Status: She is alert and oriented to person, place, and time.  Psychiatric:        Behavior: Behavior normal.      Musculoskeletal Exam: Cervical, thoracic and lumbar spine 1 good range of motion.  Shoulder joints, elbow joints, wrist joints, MCPs PIPs and DIPs with good range of motion.  She had no tenderness over right epicondyle region.  Hip joints, knee joints, ankles, MTPs and PIPs were in good range of motion with no synovitis.  CDAI Exam: CDAI Score: -- Patient Global: --; Provider Global: -- Swollen: --; Tender: -- Joint Exam 10/01/2023   No joint exam has been documented for this visit   There is currently no information documented on the homunculus. Go to the Rheumatology activity and complete the homunculus joint exam.  Investigation: No additional findings.  Imaging: No results found.  Recent Labs: Lab Results  Component Value Date   WBC 10.5 02/02/2023   HGB 11.2 (L) 02/02/2023   PLT 304 02/02/2023   NA 133 (L) 02/02/2023   K 3.0 (L) 02/02/2023   CL 105 02/02/2023   CO2 24  02/02/2023   GLUCOSE 153 (H) 02/02/2023   BUN 6 02/02/2023   CREATININE 0.78 02/02/2023   BILITOT 0.4 01/08/2023   ALKPHOS 63 01/08/2023   AST 17 01/08/2023   ALT 12 01/08/2023   PROT 6.6 01/08/2023   ALBUMIN 3.3 (L) 01/08/2023   CALCIUM 8.4 (L) 02/02/2023   August 31, 2023 urine protein creatinine ratio normal, ENA (SCL 70, RNP, Smith, SSA, SSB, dsDNA) negative, C3-C4 normal, anticardiolipin IgM> 112, beta-2 IgM> 112, lupus anticoagulant negative, C3-C4 normal, RF negative   Kimberlly 18, 2024 CMP normal creatinine 0.64, AST 15, ALT 14, hemoglobin A1c 6.0, LDL 47, CBC WBC 10.2, hemoglobin 12.0, platelets 381, sed rate 19, ANA 1: 80 NS, vitamin D 30   Speciality Comments: No specialty  comments available.  Procedures:  No procedures performed Allergies: Latex   Assessment / Plan:     Visit Diagnoses: Positive ANA (antinuclear antibody) - ANA 1: 80 NS, ENA negative, complements normal, beta-2 IgM positive, anticardiolipin IgM positive -lab results were discussed with the patient at length.  Plan: ANA  Anticardiolipin antibody positive - Anticardiolipin IgM> 112, beta-2 GP 1 IgM> 112.  Significance of anticardiolipin and beta-2 GP 1 antibodies and their association with blood clots were discussed.  Patient had previous stroke.  I am uncertain if patient had hemorrhagic or thrombotic stroke.  Repeat anticardiolipin and beta-2 GP 1 in December.  Patient is currently on aspirin 81 mg p.o. daily by her neurologist.- Plan: Beta-2 glycoprotein antibodies, Cardiolipin antibodies, IgG, IgM, IgA  Lateral epicondylitis, right elbow - X-rays obtained at the last visit were unremarkable.  Cortisone injection was given to the right lateral epicondyle region.  Tennis elbow brace prescription was given.  Patient states her symptoms remarkably improved after the cortisone injection.  She has been using tennis elbow brace which has been helpful.  She had no tenderness on palpation today.  Chronic pain of  both ankles - Patient reports swelling in her ankles towards the end of the day.  No synovitis was noted on the examination.  Vitamin D deficiency-she takes vitamin D.  Primary hypertension-she is on amlodipine.  History of CVA (cerebrovascular accident) - Infarction of right basal ganglia 05/2021. Patient developed left-sided hemiparesis.  She is on aspirin 81 mg p.o. daily.  Residual mild left-sided weakness.  History of anemia  Anxiety and depression  History of asthma  Smoker - 1/4 PPD x 30 years.  Patient reports occasional marijuana use.  Smoking cessation was discussed at length.  Orders: Orders Placed This Encounter  Procedures   Beta-2 glycoprotein antibodies   Cardiolipin antibodies, IgG, IgM, IgA   ANA   No orders of the defined types were placed in this encounter.    Follow-Up Instructions: Return for Positive ANA, positive anticardiolipin.   Pollyann Savoy, MD  Note - This record has been created using Animal nutritionist.  Chart creation errors have been sought, but may not always  have been located. Such creation errors do not reflect on  the standard of medical care.

## 2023-10-01 ENCOUNTER — Ambulatory Visit: Payer: 59 | Attending: Rheumatology | Admitting: Rheumatology

## 2023-10-01 ENCOUNTER — Encounter: Payer: Self-pay | Admitting: Rheumatology

## 2023-10-01 VITALS — BP 143/89 | HR 61 | Resp 17 | Ht 70.0 in | Wt 227.0 lb

## 2023-10-01 DIAGNOSIS — R760 Raised antibody titer: Secondary | ICD-10-CM | POA: Diagnosis not present

## 2023-10-01 DIAGNOSIS — M25571 Pain in right ankle and joints of right foot: Secondary | ICD-10-CM

## 2023-10-01 DIAGNOSIS — F419 Anxiety disorder, unspecified: Secondary | ICD-10-CM

## 2023-10-01 DIAGNOSIS — Z8709 Personal history of other diseases of the respiratory system: Secondary | ICD-10-CM

## 2023-10-01 DIAGNOSIS — M7711 Lateral epicondylitis, right elbow: Secondary | ICD-10-CM

## 2023-10-01 DIAGNOSIS — M25572 Pain in left ankle and joints of left foot: Secondary | ICD-10-CM

## 2023-10-01 DIAGNOSIS — F17200 Nicotine dependence, unspecified, uncomplicated: Secondary | ICD-10-CM

## 2023-10-01 DIAGNOSIS — E559 Vitamin D deficiency, unspecified: Secondary | ICD-10-CM

## 2023-10-01 DIAGNOSIS — I10 Essential (primary) hypertension: Secondary | ICD-10-CM

## 2023-10-01 DIAGNOSIS — R768 Other specified abnormal immunological findings in serum: Secondary | ICD-10-CM

## 2023-10-01 DIAGNOSIS — Z8673 Personal history of transient ischemic attack (TIA), and cerebral infarction without residual deficits: Secondary | ICD-10-CM

## 2023-10-01 DIAGNOSIS — G8929 Other chronic pain: Secondary | ICD-10-CM

## 2023-10-01 DIAGNOSIS — F32A Depression, unspecified: Secondary | ICD-10-CM

## 2023-10-01 DIAGNOSIS — Z862 Personal history of diseases of the blood and blood-forming organs and certain disorders involving the immune mechanism: Secondary | ICD-10-CM

## 2023-10-01 NOTE — Patient Instructions (Addendum)
Return in January for the lab work   Managing the Challenge of Quitting Smoking Quitting smoking is a physical and mental challenge. You may have cravings, withdrawal symptoms, and temptation to smoke. Before quitting, work with your health care provider to make a plan that can help you manage quitting. Making a plan before you quit may keep you from smoking when you have the urge to smoke while trying to quit. How to manage lifestyle changes Managing stress Stress can make you want to smoke, and wanting to smoke may cause stress. It is important to find ways to manage your stress. You could try some of the following: Practice relaxation techniques. Breathe slowly and deeply, in through your nose and out through your mouth. Listen to music. Soak in a bath or take a shower. Imagine a peaceful place or vacation. Get some support. Talk with family or friends about your stress. Join a support group. Talk with a counselor or therapist. Get some physical activity. Go for a walk, run, or bike ride. Play a favorite sport. Practice yoga.  Medicines Talk with your health care provider about medicines that might help you deal with cravings and make quitting easier for you. Relationships Social situations can be difficult when you are quitting smoking. To manage this, you can: Avoid parties and other social situations where people might be smoking. Avoid alcohol. Leave right away if you have the urge to smoke. Explain to your family and friends that you are quitting smoking. Ask for support and let them know you might be a bit grumpy. Plan activities where smoking is not an option. General instructions Be aware that many people gain weight after they quit smoking. However, not everyone does. To keep from gaining weight, have a plan in place before you quit, and stick to the plan after you quit. Your plan should include: Eating healthy snacks. When you have a craving, it may help to: Eat  popcorn, or try carrots, celery, or other cut vegetables. Chew sugar-free gum. Changing how you eat. Eat small portion sizes at meals. Eat 4-6 small meals throughout the day instead of 1-2 large meals a day. Be mindful when you eat. You should avoid watching television or doing other things that might distract you as you eat. Exercising regularly. Make time to exercise each day. If you do not have time for a long workout, do short bouts of exercise for 5-10 minutes several times a day. Do some form of strengthening exercise, such as weight lifting. Do some exercise that gets your heart beating and causes you to breathe deeply, such as walking fast, running, swimming, or biking. This is very important. Drinking plenty of water or other low-calorie or no-calorie drinks. Drink enough fluid to keep your urine pale yellow.  How to recognize withdrawal symptoms Your body and mind may experience discomfort as you try to get used to not having nicotine in your system. These effects are called withdrawal symptoms. They may include: Feeling hungrier than normal. Having trouble concentrating. Feeling irritable or restless. Having trouble sleeping. Feeling depressed. Craving a cigarette. These symptoms may surprise you, but they are normal to have when quitting smoking. To manage withdrawal symptoms: Avoid places, people, and activities that trigger your cravings. Remember why you want to quit. Get plenty of sleep. Avoid coffee and other drinks that contain caffeine. These may worsen some of your symptoms. How to manage cravings Come up with a plan for how to deal with your cravings. The plan should  include the following: A definition of the specific situation you want to deal with. An activity or action you will take to replace smoking. A clear idea for how this action will help. The name of someone who could help you with this. Cravings usually last for 5-10 minutes. Consider taking the  following actions to help you with your plan to deal with cravings: Keep your mouth busy. Chew sugar-free gum. Suck on hard candies or a straw. Brush your teeth. Keep your hands and body busy. Change to a different activity right away. Squeeze or play with a ball. Do an activity or a hobby, such as making bead jewelry, practicing needlepoint, or working with wood. Mix up your normal routine. Take a short exercise break. Go for a quick walk, or run up and down stairs. Focus on doing something kind or helpful for someone else. Call a friend or family member to talk during a craving. Join a support group. Contact a quitline. Where to find support To get help or find a support group: Call the National Cancer Institute's Smoking Quitline: 1-800-QUIT-NOW (561) 040-9073) Text QUIT to SmokefreeTXT: 147829 Where to find more information Visit these websites to find more information on quitting smoking: U.S. Department of Health and Human Services: www.smokefree.gov American Lung Association: www.freedomfromsmoking.org Centers for Disease Control and Prevention (CDC): FootballExhibition.com.br American Heart Association: www.heart.org Contact a health care provider if: You want to change your plan for quitting. The medicines you are taking are not helping. Your eating feels out of control or you cannot sleep. You feel depressed or become very anxious. Summary Quitting smoking is a physical and mental challenge. You will face cravings, withdrawal symptoms, and temptation to smoke again. Preparation can help you as you go through these challenges. Try different techniques to manage stress, handle social situations, and prevent weight gain. You can deal with cravings by keeping your mouth busy (such as by chewing gum), keeping your hands and body busy, calling family or friends, or contacting a quitline for people who want to quit smoking. You can deal with withdrawal symptoms by avoiding places where people  smoke, getting plenty of rest, and avoiding drinks that contain caffeine. This information is not intended to replace advice given to you by your health care provider. Make sure you discuss any questions you have with your health care provider. Document Revised: 11/08/2021 Document Reviewed: 11/08/2021 Elsevier Patient Education  2024 [employer].

## 2023-10-19 ENCOUNTER — Other Ambulatory Visit: Payer: Self-pay | Admitting: Neurology

## 2023-11-05 ENCOUNTER — Ambulatory Visit (HOSPITAL_COMMUNITY)
Admission: EM | Admit: 2023-11-05 | Discharge: 2023-11-05 | Disposition: A | Discharge status: Home / Self Care | Payer: 59 | Attending: Emergency Medicine | Admitting: Emergency Medicine

## 2023-11-05 ENCOUNTER — Encounter (HOSPITAL_COMMUNITY): Payer: Self-pay

## 2023-11-05 DIAGNOSIS — S0990XA Unspecified injury of head, initial encounter: Secondary | ICD-10-CM

## 2023-11-05 DIAGNOSIS — S060X0A Concussion without loss of consciousness, initial encounter: Secondary | ICD-10-CM

## 2023-11-05 MED ORDER — IBUPROFEN 800 MG PO TABS: 800.0000 mg | ORAL_TABLET | Freq: Three times a day (TID) | ORAL | 0 refills | Status: DC

## 2023-11-05 MED ORDER — CYCLOBENZAPRINE HCL 10 MG PO TABS: 10.0000 mg | ORAL_TABLET | Freq: Two times a day (BID) | ORAL | 0 refills | Status: DC | PRN

## 2023-11-05 MED ORDER — IBUPROFEN 800 MG PO TABS
800.0000 mg | ORAL_TABLET | Freq: Once | ORAL | Status: AC
  Administered 2023-11-05: 800 mg via ORAL

## 2023-11-05 MED ORDER — IBUPROFEN 800 MG PO TABS
ORAL_TABLET | ORAL | Status: AC
Start: 2023-11-05 — End: ?
  Filled 2023-11-05: qty 1

## 2023-11-05 NOTE — ED Provider Notes (Signed)
[facility]    CSN: [id number] Arrival date & time: 11/05/23  1440     History   Chief Complaint Chief Complaint  Patient presents with   Head Injury    HPI [name] Costa is a 42 y.o. female.  Yesterday the trunk of her car started to close and hit the top of her head. Patient felt dazed at first but that shortly resolved. She developed a headache which has persisted until now.  She did not lose consciousness. No vomiting.  Not currently dizzy. No vision changes. No weakness in extremities. 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   TOBACCO ABUSE 10/15/2009   ALLERGIC RHINITIS 10/15/2009   ASTHMA 10/15/2009    Past Surgical History:  Procedure Laterality Date   BREAST BIOPSY Right 05/25/2023   [place] RT BREAST BX W LOC DEV 1ST LESION IMG BX SPEC US GUIDE 05/25/2023 [facility] MAMMOGRAPHY   BUBBLE STUDY  06/06/2021   Procedure: BUBBLE STUDY;  Surgeon: [name], MD;  Location: [facility] ENDOSCOPY;  Service: Cardiovascular;;   CHOLECYSTECTOMY     CHOLECYSTECTOMY     COLONOSCOPY  2006   TEE WITHOUT CARDIOVERSION N/A 06/06/2021   Procedure: TRANSESOPHAGEAL ECHOCARDIOGRAM (TEE);  Surgeon: [name], MD;  Location: [facility] ENDOSCOPY;  Service: Cardiovascular;  Laterality: N/A;   TUBAL LIGATION Bilateral 04/13/2013   Procedure: POST PARTUM TUBAL LIGATION;  Surgeon: Bing Plume, MD;  Location: [facility] ORS;  Service: Gynecology;  Laterality: Bilateral;    OB History     Gravida  5   Para  2   Term  2   Preterm      AB  3   Living  2      SAB  1   IAB  2    Ectopic      Multiple      Live Births  2            Home Medications    Prior to Admission medications   Medication Sig Start Date End Date Taking? Authorizing Provider  aspirin 81 MG chewable tablet Chew 81 mg by mouth daily.   Yes [provider]  cyclobenzaprine (FLEXERIL) 10 MG tablet Take 1 tablet (10 mg total) by mouth 2 (two) times daily as needed for muscle spasms. 11/05/23  Yes Xzandria Clevinger, Lurena Joiner, PA-C  escitalopram (LEXAPRO) 5 MG tablet Take 5 mg by mouth daily. 05/05/23  Yes [provider]  ibuprofen (ADVIL) 800 MG tablet Take 1 tablet (800 mg total) by mouth 3 (three) times daily. 11/05/23  Yes Bexton Haak, PA-C  Multiple Vitamin (MULTIVITAMIN PO) Take by mouth. With iron   Yes [provider]  VITAMIN D PO Take by mouth daily.   Yes [provider]  amLODipine (NORVASC) 5 MG tablet Take 1 tablet (5 mg total) by mouth daily. 08/08/21 10/01/23  Horton Chin, MD    Family History Family History  Problem Relation Age of Onset   Hypertension Mother    Hypertension Father    Hypertension Sister  Hypertension Brother    Breast cancer Maternal Grandmother    Healthy Daughter    Healthy Son    Autism Son     Social History Social History   Tobacco Use   Smoking status: Every Day    Types: Cigarettes    Passive exposure: Current   Smokeless tobacco: Never   Tobacco comments:    5-6 cigarettes daily. Smoked Off and on since a teenager  Vaping Use   Vaping status: Former  Substance Use Topics   Alcohol use: Yes    Comment: occ   Drug use: Yes    Types: Marijuana    Comment: occ     Allergies   Latex   Review of Systems Review of Systems Per HPI  Physical Exam Triage Vital Signs ED Triage Vitals  Encounter Vitals Group     BP 11/05/23 1631 (!) 161/100     Systolic BP Percentile --      Diastolic BP Percentile --      Pulse Rate 11/05/23 1631 73     Resp 11/05/23 1631 16     Temp 11/05/23 1631 98.3 F  (36.8 C)     Temp Source 11/05/23 1631 Oral     SpO2 11/05/23 1631 97 %     Weight --      Height --      Head Circumference --      Peak Flow --      Pain Score 11/05/23 1637 8     Pain Loc --      Pain Education --      Exclude from Growth Chart --    No data found.  Updated Vital Signs BP (!) 157/83 (BP Location: Left Arm) Comment: small cuff  Pulse 73   Temp 98.3 F (36.8 C) (Oral)   Resp 16   LMP 10/16/2023   SpO2 97%    Physical Exam Vitals and nursing note reviewed.  Constitutional:      General: She is not in acute distress. HENT:     Head: Atraumatic. No raccoon eyes, Battle's sign, abrasion, contusion or laceration.      Comments: No laceration, bruising, or scalp trauma noted at this time. There is slight swelling suggestive of small hematoma. No palpable skull deformity or indentation.     Mouth/Throat:     Mouth: Mucous membranes are moist.     Pharynx: Oropharynx is clear.  Eyes:     General: Vision grossly intact.     Extraocular Movements: Extraocular movements intact.     Right eye: No nystagmus.     Left eye: No nystagmus.     Conjunctiva/sclera: Conjunctivae normal.     Pupils: Pupils are equal, round, and reactive to light.     Comments: Photosensitivity   Neck:     Comments: No bony tenderness of neck Cardiovascular:     Rate and Rhythm: Normal rate and regular rhythm.     Pulses: Normal pulses.     Heart sounds: Normal heart sounds.  Pulmonary:     Effort: Pulmonary effort is normal.     Breath sounds: Normal breath sounds.  Musculoskeletal:        General: Normal range of motion.     Cervical back: Normal range of motion.  Skin:    General: Skin is warm and dry.  Neurological:     General: No focal deficit present.     Mental Status: She is alert and oriented to person, place, and  time.     Cranial Nerves: Cranial nerves 2-12 are intact. No cranial nerve deficit or facial asymmetry.     Sensory: Sensation is intact.     Motor:  Motor function is intact. No weakness.     Coordination: Coordination is intact.     Gait: Gait is intact.     UC Treatments / Results  Labs (all labs ordered are listed, but only abnormal results are displayed) Labs Reviewed - No data to display  EKG  Radiology No results found.  Procedures Procedures   Medications Ordered in UC Medications  ibuprofen (ADVIL) tablet 800 mg (800 mg Oral Given 11/05/23 1743)    Initial Impression / Assessment and Plan / UC Course  I have reviewed the triage vital signs and the nursing notes.  Pertinent labs & imaging results that were available during my care of the patient were reviewed by me and considered in my medical decision making (see chart for details).  Stable vitals, well appearing, neurologically intact Discussed reassuring findings and no red flags No indication for emergent head CT at this time Ibuprofen dose given in clinic Can use q6 hours prn, also can try flexeril BID. Discussed concussion symptoms and monitoring at home. Provided further info in AVS. Advised strict ED precautions for any new or worsening symptoms She will follow with PCP in the next week or so as well. Patient without questions at this time  Final Clinical Impressions(s) / UC Diagnoses   Final diagnoses:  Injury of head, initial encounter  Concussion without loss of consciousness, initial encounter     Discharge Instructions      You can use ibuprofen every 6 hours (with food) Alternate with tylenol if needed  You can take the muscle relaxer (Flexeril) twice daily. If the medication makes you drowsy, take only at bed time.  Read attached information regarding concussion Try to get lots of rest - including eye rest! Avoid bright lights and phone screens  Call primary care provider first thing in the morning to make follow up appointment.  Monitor symptoms. Please go to the emergency department if symptoms worsen.    ED Prescriptions      Medication Sig Dispense Auth. Provider   cyclobenzaprine (FLEXERIL) 10 MG tablet Take 1 tablet (10 mg total) by mouth 2 (two) times daily as needed for muscle spasms. 20 tablet Tekia Waterbury, PA-C   ibuprofen (ADVIL) 800 MG tablet Take 1 tablet (800 mg total) by mouth 3 (three) times daily. 21 tablet Monroe Toure, Lurena Joiner, PA-C      PDMP not reviewed this encounter.   Kathrine Haddock 11/05/23 2051

## 2023-11-05 NOTE — ED Triage Notes (Signed)
Pt presents for a headache after she bump her head on the roof of the car yesterday. Pt reports pain 8/10. No LOC.

## 2023-11-05 NOTE — Discharge Instructions (Addendum)
You can use ibuprofen every 6 hours (with food) Alternate with tylenol if needed  You can take the muscle relaxer (Flexeril) twice daily. If the medication makes you drowsy, take only at bed time.  Read attached information regarding concussion Try to get lots of rest - including eye rest! Avoid bright lights and phone screens  Call primary care provider first thing in the morning to make follow up appointment.  Monitor symptoms. Please go to the emergency department if symptoms worsen.

## 2023-11-18 ENCOUNTER — Other Ambulatory Visit: Payer: Self-pay | Admitting: Family Medicine

## 2023-11-18 DIAGNOSIS — N6489 Other specified disorders of breast: Secondary | ICD-10-CM

## 2023-11-18 DIAGNOSIS — N6310 Unspecified lump in the right breast, unspecified quadrant: Secondary | ICD-10-CM

## 2023-11-26 ENCOUNTER — Ambulatory Visit: Payer: 59

## 2023-11-26 ENCOUNTER — Ambulatory Visit
Admission: RE | Admit: 2023-11-26 | Discharge: 2023-11-26 | Disposition: A | Discharge status: Home / Self Care | Payer: 59 | Source: Ambulatory Visit | Attending: Family Medicine | Admitting: Family Medicine

## 2023-11-26 DIAGNOSIS — N6489 Other specified disorders of breast: Secondary | ICD-10-CM

## 2023-11-26 DIAGNOSIS — N6310 Unspecified lump in the right breast, unspecified quadrant: Secondary | ICD-10-CM

## 2023-12-03 ENCOUNTER — Telehealth: Payer: Self-pay | Admitting: Neurology

## 2023-12-03 NOTE — Telephone Encounter (Signed)
 LVM and sent mychart msg informing pt of need to reschedule 01/07/24 appt - MD out

## 2023-12-23 DIAGNOSIS — I10 Essential (primary) hypertension: Secondary | ICD-10-CM | POA: Insufficient documentation

## 2023-12-23 DIAGNOSIS — E559 Vitamin D deficiency, unspecified: Secondary | ICD-10-CM | POA: Insufficient documentation

## 2023-12-23 NOTE — Progress Notes (Signed)
 Office Visit Note  Patient: [name] Costa             Date of Birth: [birth year]           MRN: [id number]             PCP: [facility] Referring: [facility]* Visit Date: 01/06/2024 Occupation: @GUAROCC @  Subjective:  No chief complaint on file.   History of Present Illness: [name] Costa is a 43 y.o. female with positive ANA and anticardiolipin antibodies.  She returns today for follow-up visit after her last visit in October 2024.  She states in December the car trunk fell on her head and she had a concussion.  She was evaluated in the emergency room and did not have any long-term complications.  She continues to be very active at her job.  She walks several steps every day.  Intermittently when she has to do certain activities she gets flare of the right elbow and ankle discomfort.  On a regular basis she does not have any joint pain or joint swelling.  She denies any history of oral ulcers, nasal ulcers, malar rash, photosensitivity, Raynaud's or lymphadenopathy.  She notices some swelling in her ankles at the end of the day.  She states few days ago she had some discomfort in her left shoulder which resolved.    Activities of Daily Living:  Patient reports morning stiffness for 30 minutes.   Patient Reports nocturnal pain.  Difficulty dressing/grooming: Reports Difficulty climbing stairs: Denies Difficulty getting out of chair: Denies Difficulty using hands for taps, buttons, cutlery, and/or writing: Reports  Review of Systems  Constitutional:  Negative for fatigue.  HENT:  Negative for mouth sores and mouth dryness.   Eyes:  Negative for dryness.  Respiratory:  Negative for shortness of breath.   Cardiovascular:  Negative for chest pain and palpitations.  Gastrointestinal:  Negative for blood in stool, constipation and diarrhea.  Endocrine: Negative for increased urination.  Genitourinary:  Negative for involuntary urination.   Musculoskeletal:  Positive for joint pain, gait problem, joint pain, joint swelling, myalgias, muscle weakness, morning stiffness, muscle tenderness and myalgias.  Skin:  Negative for color change, rash, hair loss and sensitivity to sunlight.  Allergic/Immunologic: Positive for susceptible to infections.  Neurological:  Negative for dizziness and headaches.  Hematological:  Negative for swollen glands.  Psychiatric/Behavioral:  Positive for depressed mood and sleep disturbance. The patient is nervous/anxious.     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   TOBACCO ABUSE 10/15/2009   ALLERGIC RHINITIS 10/15/2009   ASTHMA 10/15/2009    Past Medical History:  Diagnosis Date   Bladder infection    Chest wall pain    PCOS (polycystic ovarian syndrome)    Stroke (HCC) 06/04/2021   Trichomonas     Family History  Problem Relation Age of Onset   Hypertension Mother    Hypertension Father    Hypertension Sister    Hypertension Brother    Breast cancer Maternal Grandmother    Healthy Daughter    Healthy Son    Autism Son    Past Surgical History:  Procedure Laterality Date   BREAST BIOPSY Right 05/25/2023   US  RT BREAST BX W LOC DEV 1ST LESION IMG BX SPEC US  GUIDE  05/25/2023 [facility] MAMMOGRAPHY   BUBBLE STUDY  06/06/2021   Procedure: BUBBLE STUDY;  Surgeon: Kate Lonni CROME, MD;  Location: [facility] ENDOSCOPY;  Service: Cardiovascular;;   CHOLECYSTECTOMY     CHOLECYSTECTOMY     COLONOSCOPY  2006   TEE WITHOUT CARDIOVERSION N/A 06/06/2021   Procedure: TRANSESOPHAGEAL ECHOCARDIOGRAM (TEE);  Surgeon: Kate Lonni CROME, MD;  Location: [facility] ENDOSCOPY;  Service: Cardiovascular;  Laterality: N/A;   TUBAL LIGATION Bilateral 04/13/2013   Procedure: POST PARTUM TUBAL LIGATION;  Surgeon: Debby JULIANNA Lares,  MD;  Location: [facility] ORS;  Service: Gynecology;  Laterality: Bilateral;   Social History   Social History Narrative   Not on file   There is no immunization history for the selected administration types on file for this patient.   Objective: Vital Signs: BP 135/86 (BP Location: Left Arm, Patient Position: Sitting, Cuff Size: Normal)   Pulse 70   Resp 15   Ht 5' 10 (1.778 m)   Wt 234 lb (106.1 kg)   BMI 33.58 kg/m    Physical Exam Vitals and nursing note reviewed.  Constitutional:      Appearance: She is well-developed.  HENT:     Head: Normocephalic and atraumatic.  Eyes:     Conjunctiva/sclera: Conjunctivae normal.  Cardiovascular:     Rate and Rhythm: Normal rate and regular rhythm.     Heart sounds: Normal heart sounds.  Pulmonary:     Effort: Pulmonary effort is normal.     Breath sounds: Normal breath sounds.  Abdominal:     General: Bowel sounds are normal.     Palpations: Abdomen is soft.  Musculoskeletal:     Cervical back: Normal range of motion.  Lymphadenopathy:     Cervical: No cervical adenopathy.  Skin:    General: Skin is warm and dry.     Capillary Refill: Capillary refill takes less than 2 seconds.  Neurological:     Mental Status: She is alert and oriented to person, place, and time.  Psychiatric:        Behavior: Behavior normal.      Musculoskeletal Exam: Cervical, thoracic or lumbar spine with good range of motion.  Shoulders with good range of motion.  She had discomfort range of motion of her left shoulder joint.  No warmth or effusion was noted.  Elbows, wrist joints, MCPs PIPs and DIPs and good range of motion with no synovitis.  Hip joints and knee joints have good range of motion.  There was no tenderness over ankles or MTPs.    CDAI Exam: CDAI Score: -- Patient Global: --; Provider Global: -- Swollen: --; Tender: -- Joint Exam 01/06/2024   No joint exam has been documented for this visit   There is currently no information  documented on the homunculus. Go to the Rheumatology activity and complete the homunculus joint exam.  Investigation: No additional findings.  Imaging: No results found.   Recent Labs: Lab Results  Component Value Date   WBC 10.5 02/02/2023   HGB 11.2 (L) 02/02/2023   PLT 304 02/02/2023   NA 133 (L) 02/02/2023   K 3.0 (L) 02/02/2023   CL 105 02/02/2023   CO2 24 02/02/2023   GLUCOSE 153 (H) 02/02/2023   BUN 6 02/02/2023   CREATININE 0.78 02/02/2023   BILITOT 0.4 01/08/2023   ALKPHOS 63 01/08/2023   AST 17 01/08/2023   ALT 12 01/08/2023   PROT 6.6 01/08/2023   ALBUMIN 3.3 (L) 01/08/2023   CALCIUM  8.4 (L) 02/02/2023  Speciality Comments: No specialty comments available.  Procedures:  No procedures performed Allergies: Latex   Assessment / Plan:     Visit Diagnoses: Positive ANA (antinuclear antibody) - ANA low titer positive, ENA negative, complements normal, beta-2  IgM positive, anticardiolipin IgM positive -patient denies any history of oral ulcers, nasal ulcers, malar rash, for sensitivity, Raynaud's or lymphadenopathy.  No synovitis was noted on the examination.  Plan: Protein / creatinine ratio, urine, CBC with Differential/Platelet, COMPLETE METABOLIC PANEL WITH GFR, ANA, Anti-DNA antibody, double-stranded, C3 and C4, Sedimentation rate, Beta-2  glycoprotein antibodies, Cardiolipin antibodies, IgG, IgM, IgA  Anticardiolipin antibody positive - August 31, 2023 anticardiolipin IgM> 112, beta-2  IgM> 112 lupus anticoagulant negative.  Chronic left shoulder pain-she has been having discomfort in the left shoulder joint intermittently.  I gave her a handout on shoulder joint exercises.  Lateral epicondylitis, right elbow -she has intermittent discomfort in her right lateral epicondyle region depending on the activity.  She had no tenderness today.  Good response to injection on August 31, 2023.  Chronic pain of both ankles-patient states she gets swelling in her  ankles after prolonged sitting and driving.  No synovitis was noted on the examination.  Vitamin D  deficiency -she is history of vitamin D  deficiency.  She has been taking vitamin D .  Will check vitamin D  level today.  Plan: VITAMIN D  25 Hydroxy (Vit-D Deficiency, Fractures)  Primary hypertension-blood pressure was normal at 135/86.  History of CVA (cerebrovascular accident) - Right basal ganglion July 2022 with left-sided hemiparesis.  She is on aspirin  81 mg, 2 tablets p.o. p.o. daily.  Anxiety and depression  History of anemia  History of asthma  Smoker - 1/4 pack/day for 30 years.  Occasional marijuana use.  Smoking cessation was discussed at length.  Increased risk of DVT with smoking was discussed.  A handout was placed in the AVS.  BMI 33.0-33.9,adult-weight loss diet and exercise was discussed.  Handout was placed in the AVS.  Orders: Orders Placed This Encounter  Procedures   Protein / creatinine ratio, urine   CBC with Differential/Platelet   COMPLETE METABOLIC PANEL WITH GFR   ANA   Anti-DNA antibody, double-stranded   C3 and C4   Sedimentation rate   Beta-2  glycoprotein antibodies   Cardiolipin antibodies, IgG, IgM, IgA   VITAMIN D  25 Hydroxy (Vit-D Deficiency, Fractures)   No orders of the defined types were placed in this encounter.    Follow-Up Instructions: Return in about 6 months (around 07/05/2024) for +ANA, +ACL.   Maya Nash, MD  Note - This record has been created using Animal nutritionist.  Chart creation errors have been sought, but may not always  have been located. Such creation errors do not reflect on  the standard of medical care.

## 2024-01-06 ENCOUNTER — Ambulatory Visit: Payer: Self-pay | Attending: Rheumatology | Admitting: Rheumatology

## 2024-01-06 ENCOUNTER — Encounter: Payer: Self-pay | Admitting: Rheumatology

## 2024-01-06 ENCOUNTER — Inpatient Hospital Stay (HOSPITAL_COMMUNITY)
Admission: EM | Admit: 2024-01-06 | Discharge: 2024-01-08 | DRG: 124 | Disposition: A | Discharge status: Home / Self Care | Payer: MEDICAID | Attending: Neurology | Admitting: Neurology

## 2024-01-06 ENCOUNTER — Encounter (HOSPITAL_COMMUNITY): Payer: Self-pay

## 2024-01-06 ENCOUNTER — Emergency Department (HOSPITAL_COMMUNITY): Payer: MEDICAID

## 2024-01-06 ENCOUNTER — Other Ambulatory Visit: Payer: Self-pay

## 2024-01-06 VITALS — BP 135/86 | HR 70 | Resp 15 | Ht 70.0 in | Wt 234.0 lb

## 2024-01-06 DIAGNOSIS — I10 Essential (primary) hypertension: Secondary | ICD-10-CM

## 2024-01-06 DIAGNOSIS — E785 Hyperlipidemia, unspecified: Secondary | ICD-10-CM | POA: Diagnosis present

## 2024-01-06 DIAGNOSIS — Z7982 Long term (current) use of aspirin: Secondary | ICD-10-CM

## 2024-01-06 DIAGNOSIS — E669 Obesity, unspecified: Secondary | ICD-10-CM | POA: Diagnosis present

## 2024-01-06 DIAGNOSIS — E282 Polycystic ovarian syndrome: Secondary | ICD-10-CM | POA: Diagnosis present

## 2024-01-06 DIAGNOSIS — F419 Anxiety disorder, unspecified: Secondary | ICD-10-CM | POA: Diagnosis present

## 2024-01-06 DIAGNOSIS — Z716 Tobacco abuse counseling: Secondary | ICD-10-CM | POA: Diagnosis not present

## 2024-01-06 DIAGNOSIS — Z91040 Latex allergy status: Secondary | ICD-10-CM

## 2024-01-06 DIAGNOSIS — M25512 Pain in left shoulder: Secondary | ICD-10-CM | POA: Diagnosis not present

## 2024-01-06 DIAGNOSIS — H3411 Central retinal artery occlusion, right eye: Secondary | ICD-10-CM | POA: Diagnosis present

## 2024-01-06 DIAGNOSIS — H5461 Unqualified visual loss, right eye, normal vision left eye: Secondary | ICD-10-CM | POA: Diagnosis present

## 2024-01-06 DIAGNOSIS — I69354 Hemiplegia and hemiparesis following cerebral infarction affecting left non-dominant side: Secondary | ICD-10-CM

## 2024-01-06 DIAGNOSIS — D6861 Antiphospholipid syndrome: Secondary | ICD-10-CM | POA: Diagnosis present

## 2024-01-06 DIAGNOSIS — M25572 Pain in left ankle and joints of left foot: Secondary | ICD-10-CM

## 2024-01-06 DIAGNOSIS — Z5971 Insufficient health insurance coverage: Secondary | ICD-10-CM | POA: Diagnosis not present

## 2024-01-06 DIAGNOSIS — G43909 Migraine, unspecified, not intractable, without status migrainosus: Secondary | ICD-10-CM | POA: Diagnosis present

## 2024-01-06 DIAGNOSIS — R768 Other specified abnormal immunological findings in serum: Secondary | ICD-10-CM

## 2024-01-06 DIAGNOSIS — F17200 Nicotine dependence, unspecified, uncomplicated: Secondary | ICD-10-CM

## 2024-01-06 DIAGNOSIS — F17210 Nicotine dependence, cigarettes, uncomplicated: Secondary | ICD-10-CM | POA: Diagnosis present

## 2024-01-06 DIAGNOSIS — R760 Raised antibody titer: Secondary | ICD-10-CM | POA: Diagnosis not present

## 2024-01-06 DIAGNOSIS — R7689 Other specified abnormal immunological findings in serum: Secondary | ICD-10-CM

## 2024-01-06 DIAGNOSIS — Z6833 Body mass index (BMI) 33.0-33.9, adult: Secondary | ICD-10-CM

## 2024-01-06 DIAGNOSIS — Z8249 Family history of ischemic heart disease and other diseases of the circulatory system: Secondary | ICD-10-CM

## 2024-01-06 DIAGNOSIS — M7711 Lateral epicondylitis, right elbow: Secondary | ICD-10-CM

## 2024-01-06 DIAGNOSIS — Z8673 Personal history of transient ischemic attack (TIA), and cerebral infarction without residual deficits: Secondary | ICD-10-CM

## 2024-01-06 DIAGNOSIS — F1210 Cannabis abuse, uncomplicated: Secondary | ICD-10-CM | POA: Diagnosis present

## 2024-01-06 DIAGNOSIS — F32A Depression, unspecified: Secondary | ICD-10-CM

## 2024-01-06 DIAGNOSIS — Z862 Personal history of diseases of the blood and blood-forming organs and certain disorders involving the immune mechanism: Secondary | ICD-10-CM

## 2024-01-06 DIAGNOSIS — Z791 Long term (current) use of non-steroidal anti-inflammatories (NSAID): Secondary | ICD-10-CM

## 2024-01-06 DIAGNOSIS — D72829 Elevated white blood cell count, unspecified: Secondary | ICD-10-CM | POA: Diagnosis present

## 2024-01-06 DIAGNOSIS — Z8709 Personal history of other diseases of the respiratory system: Secondary | ICD-10-CM

## 2024-01-06 DIAGNOSIS — Z9049 Acquired absence of other specified parts of digestive tract: Secondary | ICD-10-CM | POA: Diagnosis not present

## 2024-01-06 DIAGNOSIS — R29700 NIHSS score 0: Secondary | ICD-10-CM | POA: Diagnosis present

## 2024-01-06 DIAGNOSIS — M25571 Pain in right ankle and joints of right foot: Secondary | ICD-10-CM

## 2024-01-06 DIAGNOSIS — Z79899 Other long term (current) drug therapy: Secondary | ICD-10-CM | POA: Diagnosis not present

## 2024-01-06 DIAGNOSIS — G8929 Other chronic pain: Secondary | ICD-10-CM

## 2024-01-06 DIAGNOSIS — M25521 Pain in right elbow: Secondary | ICD-10-CM

## 2024-01-06 DIAGNOSIS — I6300 Cerebral infarction due to thrombosis of unspecified precerebral artery: Secondary | ICD-10-CM

## 2024-01-06 DIAGNOSIS — E559 Vitamin D deficiency, unspecified: Secondary | ICD-10-CM

## 2024-01-06 LAB — URINALYSIS, ROUTINE W REFLEX MICROSCOPIC
Bacteria, UA: NONE SEEN
Bilirubin Urine: NEGATIVE
Glucose, UA: NEGATIVE mg/dL
Ketones, ur: 5 mg/dL — AB
Leukocytes,Ua: NEGATIVE
Nitrite: NEGATIVE
Protein, ur: NEGATIVE mg/dL
Specific Gravity, Urine: >1.046 — ABNORMAL HIGH (ref 1.005–1.030)
pH: 6 (ref 5.0–8.0)

## 2024-01-06 LAB — DIFFERENTIAL
Abs Immature Granulocytes: 0.03 10*3/uL (ref 0.00–0.07)
Basophils Absolute: 0 10*3/uL (ref 0.0–0.1)
Basophils Relative: 0 %
Eosinophils Absolute: 0.1 10*3/uL (ref 0.0–0.5)
Eosinophils Relative: 1 %
Immature Granulocytes: 0 %
Lymphocytes Relative: 22 %
Lymphs Abs: 2.6 10*3/uL (ref 0.7–4.0)
Monocytes Absolute: 0.7 10*3/uL (ref 0.1–1.0)
Monocytes Relative: 6 %
Neutro Abs: 8.7 10*3/uL — ABNORMAL HIGH (ref 1.7–7.7)
Neutrophils Relative %: 71 %

## 2024-01-06 LAB — APTT: aPTT: 38 s — ABNORMAL HIGH (ref 24–36)

## 2024-01-06 LAB — CBC
HCT: 39.2 % (ref 36.0–46.0)
Hemoglobin: 12.5 g/dL (ref 12.0–15.0)
MCH: 26.8 pg (ref 26.0–34.0)
MCHC: 31.9 g/dL (ref 30.0–36.0)
MCV: 83.9 fL (ref 80.0–100.0)
Platelets: 278 10*3/uL (ref 150–400)
RBC: 4.67 MIL/uL (ref 3.87–5.11)
RDW: 17.7 % — ABNORMAL HIGH (ref 11.5–15.5)
WBC: 12.2 10*3/uL — ABNORMAL HIGH (ref 4.0–10.5)
nRBC: 0 % (ref 0.0–0.2)

## 2024-01-06 LAB — I-STAT CHEM 8, ED
BUN: 7 mg/dL (ref 6–20)
Calcium, Ion: 1.11 mmol/L — ABNORMAL LOW (ref 1.15–1.40)
Chloride: 106 mmol/L (ref 98–111)
Creatinine, Ser: 0.6 mg/dL (ref 0.44–1.00)
Glucose, Bld: 86 mg/dL (ref 70–99)
HCT: 38 % (ref 36.0–46.0)
Hemoglobin: 12.9 g/dL (ref 12.0–15.0)
Potassium: 3.6 mmol/L (ref 3.5–5.1)
Sodium: 138 mmol/L (ref 135–145)
TCO2: 22 mmol/L (ref 22–32)

## 2024-01-06 LAB — PROTIME-INR
INR: 1 (ref 0.8–1.2)
Prothrombin Time: 13.7 s (ref 11.4–15.2)

## 2024-01-06 LAB — COMPREHENSIVE METABOLIC PANEL
ALT: 14 U/L (ref 0–44)
AST: 17 U/L (ref 15–41)
Albumin: 3.7 g/dL (ref 3.5–5.0)
Alkaline Phosphatase: 78 U/L (ref 38–126)
Anion gap: 11 (ref 5–15)
BUN: 7 mg/dL (ref 6–20)
CO2: 22 mmol/L (ref 22–32)
Calcium: 9.3 mg/dL (ref 8.9–10.3)
Chloride: 105 mmol/L (ref 98–111)
Creatinine, Ser: 0.62 mg/dL (ref 0.44–1.00)
GFR, Estimated: >60 mL/min (ref >60)
Glucose, Bld: 85 mg/dL (ref 70–99)
Potassium: 3.6 mmol/L (ref 3.5–5.1)
Sodium: 138 mmol/L (ref 135–145)
Total Bilirubin: 0.6 mg/dL (ref 0.0–1.2)
Total Protein: 7.3 g/dL (ref 6.5–8.1)

## 2024-01-06 LAB — ETHANOL: Alcohol, Ethyl (B): <10 mg/dL (ref <10)

## 2024-01-06 LAB — RAPID URINE DRUG SCREEN, HOSP PERFORMED
Amphetamines: NOT DETECTED
Barbiturates: NOT DETECTED
Benzodiazepines: NOT DETECTED
Cocaine: NOT DETECTED
Opiates: NOT DETECTED
Tetrahydrocannabinol: POSITIVE — AB

## 2024-01-06 LAB — HCG, SERUM, QUALITATIVE: Preg, Serum: NEGATIVE

## 2024-01-06 LAB — CBG MONITORING, ED: Glucose-Capillary: 82 mg/dL (ref 70–99)

## 2024-01-06 MED ORDER — PANTOPRAZOLE SODIUM 40 MG IV SOLR
40.0000 mg | Freq: Every day | INTRAVENOUS | Status: DC
  Administered 2024-01-06: 40 mg via INTRAVENOUS
  Filled 2024-01-06: qty 10

## 2024-01-06 MED ORDER — ACETAMINOPHEN 160 MG/5ML PO SOLN: 650.0000 mg | ORAL | Status: DC | PRN

## 2024-01-06 MED ORDER — ACETAMINOPHEN 650 MG RE SUPP: 650.0000 mg | RECTAL | Status: DC | PRN

## 2024-01-06 MED ORDER — SENNOSIDES-DOCUSATE SODIUM 8.6-50 MG PO TABS: 1.0000 | ORAL_TABLET | Freq: Every evening | ORAL | Status: DC | PRN

## 2024-01-06 MED ORDER — SODIUM CHLORIDE 0.9 % IV SOLN: INTRAVENOUS | Status: DC

## 2024-01-06 MED ORDER — ACETAMINOPHEN 325 MG PO TABS: 650.0000 mg | ORAL_TABLET | ORAL | Status: DC | PRN

## 2024-01-06 MED ORDER — TENECTEPLASE FOR STROKE
25.0000 mg | PACK | Freq: Once | INTRAVENOUS | Status: AC
  Administered 2024-01-06: 25 mg via INTRAVENOUS
  Filled 2024-01-06: qty 10

## 2024-01-06 MED ORDER — STROKE: EARLY STAGES OF RECOVERY BOOK
Freq: Once | Status: AC
  Filled 2024-01-06: qty 1

## 2024-01-06 MED ORDER — IOHEXOL 350 MG/ML SOLN
75.0000 mL | Freq: Once | INTRAVENOUS | Status: AC | PRN
  Administered 2024-01-06: 75 mL via INTRAVENOUS

## 2024-01-06 NOTE — H&P (Signed)
 NEUROLOGY H&P NOTE   Date of service: January 06, 2024 Patient Name: [name] Costa MRN:  [id number] DOB:  [birth year] Chief Complaint: vision loss right eye  History of Present Illness  [name] Costa is a 43 y.o. female with hx of right basal ganglia infarct with no residual deficits in 2022, positive ANA and anticardiolipin antibodies being followed by rheumatology, presented to the emergency department for evaluation of sudden onset of vision loss in the right eye that was complete and lasted for about a minute and then the vision started getting somewhat blurred.  It started to improve but still had blurred vision at the time of arrival.  I was called by the ED provider at triage at 7:40 PM to discuss this case and after review of her chart, I recommended a code stroke be activated. I saw the patient in the CT scanner as a code stroke was being activated.  She did endorse blurred vision in the right eye, she could still read the signs on the wall but said that it was blurred in the right eye compared to the left.  She endorses a headache that started after the visual field loss but did not describe this as her usual migraine.  She gets migraine once a while but not regular migraines.  She did report some pain with eye movement especially to the right.  Denied curtainlike loss of vision.  She is on aspirin .  Reports compliance to medications.  Not on anticoagulation.  No major surgeries or contraindications for thrombolysis.  No visual field deficits or field cuts.  No motor, sensory or cranial nerve deficits otherwise. Noncontrast head CT was completed and reviewed by me-no acute changes.  Sequela of prior stroke in the right basal ganglia seen.  Her NIH stroke scale was 0 but given the fact that the vision loss in the right eye could be from a stroke and could be very disabling at her young age, I offered her TNKase .  Risks benefits and alternatives of IV TNKase  discussed with her and she  agreed to proceed after careful consideration.  IV TNKase  was administered after the review of imaging and risk/benefits/alternative discussion which was personally had by me with the patient.  Last known well: 1800 hrs Modified rankin score: 0-Completely asymptomatic and back to baseline post- stroke IV Thrombolysis: Yes Thrombectomy: No ELVO  NIHSS components Score: Comment  1a Level of Conscious 0[x]  1[]  2[]  3[]      1b LOC Questions 0[x]  1[]  2[]       1c LOC Commands 0[x]  1[]  2[]       2 Best Gaze 0[x]  1[]  2[]       3 Visual 0[x]  1[]  2[]  3[]      4 Facial Palsy 0[x]  1[]  2[]  3[]      5a Motor Arm - left 0[x]  1[]  2[]  3[]  4[]  UN[]    5b Motor Arm - Right 0[x]  1[]  2[]  3[]  4[]  UN[]    6a Motor Leg - Left 0[x]  1[]  2[]  3[]  4[]  UN[]    6b Motor Leg - Right 0[x]  1[]  2[]  3[]  4[]  UN[]    7 Limb Ataxia 0[x]  1[]  2[]  3[]  UN[]     8 Sensory 0[x]  1[]  2[]  UN[]      9 Best Language 0[x]  1[]  2[]  3[]      10 Dysarthria 0[x]  1[]  2[]  UN[]      11 Extinct. and Inattention 0[x]  1[]  2[]       TOTAL: 0      ROS  Comprehensive ROS performed and pertinent positives documented in  the HPI  Past History   Past Medical History:  Diagnosis Date   Bladder infection    Chest wall pain    PCOS (polycystic ovarian syndrome)    Stroke (HCC) 06/04/2021   Trichomonas    Past Surgical History:  Procedure Laterality Date   BREAST BIOPSY Right 05/25/2023   US  RT BREAST BX W LOC DEV 1ST LESION IMG BX SPEC US  GUIDE 05/25/2023 [facility] MAMMOGRAPHY   BUBBLE STUDY  06/06/2021   Procedure: BUBBLE STUDY;  Surgeon: Kate Lonni CROME, MD;  Location: [facility] ENDOSCOPY;  Service: Cardiovascular;;   CHOLECYSTECTOMY     CHOLECYSTECTOMY     COLONOSCOPY  2006   TEE WITHOUT CARDIOVERSION N/A 06/06/2021   Procedure: TRANSESOPHAGEAL ECHOCARDIOGRAM (TEE);  Surgeon: Kate Lonni CROME, MD;  Location: [facility] ENDOSCOPY;  Service: Cardiovascular;  Laterality: N/A;   TUBAL LIGATION Bilateral 04/13/2013   Procedure: POST PARTUM TUBAL LIGATION;   Surgeon: Debby JULIANNA Lares, MD;  Location: [facility] ORS;  Service: Gynecology;  Laterality: Bilateral;   Family History  Problem Relation Age of Onset   Hypertension Mother    Hypertension Father    Hypertension Sister    Hypertension Brother    Breast cancer Maternal Grandmother    Healthy Daughter    Healthy Son    Autism Son    Social History   Socioeconomic History   Marital status: Divorced    Spouse name: Not on file   Number of children: Not on file   Years of education: Not on file   Highest education level: Not on file  Occupational History   Not on file  Tobacco Use   Smoking status: Every Day    Types: Cigarettes    Passive exposure: Never   Smokeless tobacco: Never   Tobacco comments:    5-6 cigarettes daily. Smoked Off and on since a teenager  Vaping Use   Vaping status: Former  Substance and Sexual Activity   Alcohol use: Not Currently   Drug use: Yes    Types: Marijuana    Comment: occ   Sexual activity: Not on file  Other Topics Concern   Not on file  Social History Narrative   Not on file   Social Drivers of Health   Financial Resource Strain: Not on file  Food Insecurity: Not on file  Transportation Needs: Not on file  Physical Activity: Not on file  Stress: Not on file  Social Connections: Not on file   Allergies  Allergen Reactions   Latex Rash    Medications   Current Facility-Administered Medications:    tenecteplase  (TNKASE ) injection for Stroke 25 mg, 25 mg, Intravenous, Once, Voncile Isles, MD  Current Outpatient Medications:    amLODipine  (NORVASC ) 5 MG tablet, Take 1 tablet (5 mg total) by mouth daily., Disp: 30 tablet, Rfl: 11   aspirin  81 MG chewable tablet, Chew 81 mg by mouth daily., Disp: , Rfl:    atorvastatin  (LIPITOR) 40 MG tablet, Take 40 mg by mouth daily., Disp: , Rfl:    calcium  carbonate (SUPER CALCIUM ) 1500 (600 Ca) MG TABS tablet, 1 tablet with meals Orally once a day, Disp: , Rfl:    cyclobenzaprine  (FLEXERIL ) 10 MG  tablet, Take 1 tablet (10 mg total) by mouth 2 (two) times daily as needed for muscle spasms. (Patient not taking: Reported on 01/06/2024), Disp: 20 tablet, Rfl: 0   escitalopram  (LEXAPRO ) 10 MG tablet, Take 10 mg by mouth daily., Disp: , Rfl:    ibuprofen  (ADVIL ) 800 MG  tablet, Take 1 tablet (800 mg total) by mouth 3 (three) times daily. (Patient taking differently: Take 800 mg by mouth as needed.), Disp: 21 tablet, Rfl: 0   Multiple Vitamin (MULTIVITAMIN PO), Take by mouth. With iron, Disp: , Rfl:    VITAMIN D  PO, Take by mouth daily., Disp: , Rfl:    Vitals   Vitals:   01/06/24 1920 01/06/24 1922  BP: (!) 150/96   Pulse: 69   Resp: 17   Temp: 98 F (36.7 C)   SpO2: 100%   Weight:  106.1 kg     Body mass index is 33.58 kg/m.  Physical Exam  General: Well-developed well-nourished African-American woman in no acute distress HEENT: Normocephalic atraumatic Lungs: Clear Cardiovascular: Regular rate rhythm Abdomen nondistended nontender Neurological exam She is awake alert oriented x 3.  No evidence of aphasia.  No evidence of dysarthria. Cranial nerve examination: Pupils equal round reactive light, extraocular movements intact with mild discomfort while looking to the right in the right eye, no visual field deficits or field cuts noted, she did report subjective blurred vision in the right eye in comparison to the left but the acuity is nearly 20/20.  Facial sensation intact.  Face symmetric.  Tongue and palate midline. Motor examination with no drift in any of the 4 extremities Sensation intact light touch without extinction Coordination examination reveals no dysmetria Gait was normal.   Labs   CBC:  Recent Labs  Lab 01/06/24 1940 01/06/24 1943  WBC  --  12.2*  NEUTROABS  --  8.7*  HGB 12.9 12.5  HCT 38.0 39.2  MCV  --  83.9  PLT  --  278   Basic Metabolic Panel:  Lab Results  Component Value Date   NA 138 01/06/2024   K 3.6 01/06/2024   CO2 24 02/02/2023    GLUCOSE 86 01/06/2024   BUN 7 01/06/2024   CREATININE 0.60 01/06/2024   CALCIUM  8.4 (L) 02/02/2023   GFRNONAA >60 02/02/2023   Lipid Panel:  Lab Results  Component Value Date   LDLCALC 115 (H) 06/05/2021   HgbA1c:  Lab Results  Component Value Date   HGBA1C 5.4 06/05/2021   Urine Drug Screen:     Component Value Date/Time   LABOPIA NONE DETECTED 06/04/2021 0940   COCAINSCRNUR NONE DETECTED 06/04/2021 0940   LABBENZ NONE DETECTED 06/04/2021 0940   AMPHETMU NONE DETECTED 06/04/2021 0940   THCU POSITIVE (A) 06/04/2021 0940   LABBARB NONE DETECTED 06/04/2021 0940    INR  Lab Results  Component Value Date   INR 1.0 06/04/2021   APTT  Lab Results  Component Value Date   APTT 34 06/04/2021     CT Head without contrast(Personally reviewed): ASPECTS10 no bleed.  Old right basal ganglia infarct  CT angio Head and Neck with contrast(Personally reviewed): No ELVO.  Assessment   Teasia L Graul is a 43 y.o. female past history of right basal ganglia infarct with no residual deficits, ANA positive, anticardiolipin antibody positive, presenting for sudden right sided monocular vision loss for about a minute with some improvement but continuing blurred vision.  Also had headache accompanying this.  Not like her typical migraine. Could be a complex migraine but in ophthalmic artery occlusion cannot be ruled out. Detailed discussion about risks benefits and IV TNKase  given high risk of disability with vision loss in 1 eye had with the patient.  After careful consideration of the above, she agreed to proceed.  Imaging was reviewed prior to  TNK administration personally. CT head showed no evidence of bleed or any acute changes. IV TNKase  was administered. CT angiography head and neck was done after-no ELVO. 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  Recommendations  Admit to neuro ICU Frequent neurochecks and vitals per post TNK protocol No antiplatelets or anticoagulants for the next 24 hours unless the 24-hour imaging is negative for bleed. MRI ordered for after 7 PM tomorrow. Stat CT head in case of any neurochanges Telemetry 2D echo A1c Lipid panel SCDs for DVT prophylaxis.  No antiplatelets or anticoagulants. Therapy assessments Post TNK order set placed Gentle hydration with normal saline 75 cc an hour N.p.o. until cleared by swallow evaluation Check CBC and BMP in the morning. Has mild leukocytosis Will check urinalysis and chest x-ray Plan discussed with Dr. Jerral, EDP. Stroke team will continue to follow.  ______________________________________________________________________   Bonney Eligio Lav, MD [facility]  Risks, benefits and alternatives of IVT discussed with patient and/or family and they agreed. CTH personally reviewed prior to TNK administration    CRITICAL CARE ATTESTATION Performed by: Eligio Lav, MD Total critical care time: 35 minutes Critical care time was exclusive of separately billable procedures and treating other patients and/or supervising APPs/Residents/Students Critical care was necessary to treat or prevent imminent or life-threatening deterioration. This patient is critically ill and at significant risk for neurological worsening and/or death and care requires constant monitoring. Critical care was time spent personally by me on the following activities: development of treatment plan with patient and/or surrogate as well as nursing, discussions with consultants, evaluation of patient's response to treatment, examination of patient, obtaining history from patient or surrogate, ordering and performing treatments and interventions, ordering and review of laboratory studies, ordering and review of radiographic studies, pulse oximetry, re-evaluation of  patient's condition, participation in multidisciplinary rounds and medical decision making of high complexity in the care of this patient.

## 2024-01-06 NOTE — ED Triage Notes (Signed)
 Pt arrives with c/o an episode of loss of vision in her right eye around 1800 today. Pt reports the episode lasted about a minute. Per pt, after that episode she experience twitching of her right eye and then onset of a headache. Pt reports blurry vision in her right eye and light sensitivity. Pt denies focal weakness, dizziness, slurred speech, or facial droop. Pt a&ox4. Pt has hx of stroke. Pt does not take blood thinners.

## 2024-01-06 NOTE — Progress Notes (Signed)
 PHARMACIST CODE STROKE RESPONSE  Notified to mix TNK at 1955 by Dr. Voncile TNK preparation completed at 1958  TNK dose = 25 mg IV over 5 seconds given at 1959  Issues/delays encountered (if applicable): delayed giving dose shortly per Neuro to confirm imaging  [name] Costa, PharmD PGY1 Pharmacy Resident  Please check AMION for all [facility] phone numbers After 10:00 PM, call Main Pharmacy [phone] 01/06/24 8:00 PM

## 2024-01-06 NOTE — Code Documentation (Signed)
 Stroke Response Nurse Documentation Code Documentation  [name] Costa is a 43 y.o. female arriving to [facility]  via [employer] on 2/5 with past medical hx of  right basal ganglia infarct with no residual deficits in 2022, positive ANA and anticardiolipin antibodies. On aspirin  81 mg daily. Code stroke was activated by ED.   Patient from home where she was LKW at 1800 and now complaining of blurred vision in Right eye.   Stroke team at the bedside on patient arrival. Labs drawn and patient cleared for CT by Britni henderly PA. Patient to CT with team. NIHSS 0, see documentation for details and code stroke times. Patient with blurred vision in right eye on exam. The following imaging was completed:  CT Head. Patient is a candidate for IV Thrombolytic due to vision loss in right eye potential deficit. Patient is not a candidate for IR due to No LVO.   Care Plan: post TNKase  guidelines and admit to ICU  Bedside handoff with ED RN Hulan.    Griselda Alm ORN  Rapid Response RN

## 2024-01-06 NOTE — ED Provider Notes (Signed)
 [place] EMERGENCY DEPARTMENT AT [facility] Provider Note   CSN: [id number] Arrival date & time: 01/06/24  1831  An emergency department physician performed an initial assessment on this suspected stroke patient at 51. History  Chief Complaint  Patient presents with   Loss of Vision    [name] Costa is a 43 y.o. female prior CVA here for evaluation sudden onset vision loss.  States around 6 PM today she developed vision loss in her right eye.  Then developed twitching to her eye and headache.  Vision improved however still has some blurred vision to right eye and sensitivity.  Headache improved.  No numbness or weakness to her extremities.  No slurred speech.  No eye pain.  Had a prior stroke with left-sided hemiparesis.  Takes aspirin  daily.  No neck pain, neck stiffness, no sudden onset thunderclap headache.  No chest pain, shortness of breath.  Code stroke called from triage  HPI     Home Medications Prior to Admission medications   Medication Sig Start Date End Date Taking? Authorizing Provider  amLODipine  (NORVASC ) 5 MG tablet Take 1 tablet (5 mg total) by mouth daily. 08/08/21 01/06/24  Lorilee Sven SQUIBB, MD  aspirin  81 MG chewable tablet Chew 81 mg by mouth daily.    [provider]  atorvastatin  (LIPITOR) 40 MG tablet Take 40 mg by mouth daily. 12/27/23   [provider]  calcium  carbonate (SUPER CALCIUM ) 1500 (600 Ca) MG TABS tablet Take 1,500 mg by mouth daily.    [provider]  cyclobenzaprine  (FLEXERIL ) 10 MG tablet Take 1 tablet (10 mg total) by mouth 2 (two) times daily as needed for muscle spasms. Patient not taking: Reported on 01/06/2024 11/05/23   Rising, Asberry, PA-C  escitalopram  (LEXAPRO ) 10 MG tablet Take 10 mg by mouth daily. 11/09/23   [provider]  ibuprofen  (ADVIL ) 800 MG tablet Take 1 tablet (800 mg total) by mouth 3 (three) times daily. Patient taking differently: Take 800 mg by mouth as needed. 11/05/23    Rising, Asberry, PA-C  Multiple Vitamin (MULTIVITAMIN PO) Take by mouth. With iron    [provider]  VITAMIN D  PO Take by mouth daily.    [provider]      Allergies    Latex    Review of Systems   Review of Systems  Constitutional: Negative.   HENT: Negative.    Eyes:  Positive for visual disturbance.  Respiratory: Negative.    Cardiovascular: Negative.   Gastrointestinal: Negative.   Genitourinary: Negative.   Musculoskeletal: Negative.   Skin: Negative.   Neurological:  Positive for headaches. Negative for dizziness, tremors, seizures, syncope, facial asymmetry, speech difficulty, weakness, light-headedness and numbness.  All other systems reviewed and are negative.   Physical Exam Updated Vital Signs BP (!) 143/88   Pulse 64   Temp 98 F (36.7 C)   Resp 20   Wt 106.1 kg   SpO2 100%   BMI 33.58 kg/m  Physical Exam Physical Exam  Constitutional: Pt is oriented to person, place, and time. Pt appears well-developed and well-nourished. No distress.  HENT:  Head: Normocephalic and atraumatic.  Mouth/Throat: Oropharynx is clear and moist.  Eyes: Conjunctivae and EOM are normal. Pupils are equal, round, and reactive to light. No scleral icterus.  No horizontal, vertical or rotational nystagmus  Neck: Normal range of motion. Neck supple.  Full active and passive ROM without pain No midline or paraspinal tenderness No nuchal rigidity or meningeal  signs  Cardiovascular: Normal rate, regular rhythm and intact distal pulses.   Pulmonary/Chest: Effort normal and breath sounds normal. No respiratory distress. Pt has no wheezes. No rales.  Abdominal: Soft. Bowel sounds are normal. There is no tenderness. There is no rebound and no guarding.  Musculoskeletal: Normal range of motion.  Lymphadenopathy:    No cervical adenopathy.  Neurological: Pt. is alert and oriented to person, place, and time. He has normal reflexes. No cranial nerve deficit.  Exhibits  normal muscle tone. Coordination normal.  Mental Status:  Alert, oriented, thought content appropriate. Speech fluent without evidence of aphasia. Able to follow 2 step commands without difficulty.  Cranial Nerves:  II:  Subjective decreased sensation to right eye, acuity 20/20 on exam III,IV, VI: ptosis not present, extra-ocular motions intact bilaterally  V,VII: smile symmetric, facial light touch sensation equal VIII: hearing grossly normal bilaterally  IX,X: midline uvula rise  XI: bilateral shoulder shrug equal and strong XII: midline tongue extension  Motor:  Equal strength Sensory:intact sensation Cerebellar: normal finger-to-nose with bilateral upper extremities Gait: normal gait and balance CV: distal pulses palpable throughout   Skin: Skin is warm and dry. No rash noted. Pt is not diaphoretic.  Psychiatric: Pt has a normal mood and affect. Behavior is normal. Judgment and thought content normal.  Nursing note and vitals reviewed.  ED Results / Procedures / Treatments   Labs (all labs ordered are listed, but only abnormal results are displayed) Labs Reviewed  APTT - Abnormal; Notable for the following components:      Result Value   aPTT 38 (*)    All other components within normal limits  CBC - Abnormal; Notable for the following components:   WBC 12.2 (*)    RDW 17.7 (*)    All other components within normal limits  DIFFERENTIAL - Abnormal; Notable for the following components:   Neutro Abs 8.7 (*)    All other components within normal limits  RAPID URINE DRUG SCREEN, HOSP PERFORMED - Abnormal; Notable for the following components:   Tetrahydrocannabinol POSITIVE (*)    All other components within normal limits  URINALYSIS, ROUTINE W REFLEX MICROSCOPIC - Abnormal; Notable for the following components:   Specific Gravity, Urine >1.046 (*)    Hgb urine dipstick MODERATE (*)    Ketones, ur 5 (*)    All other components within normal limits  I-STAT CHEM 8, ED -  Abnormal; Notable for the following components:   Calcium , Ion 1.11 (*)    All other components within normal limits  ETHANOL  PROTIME-INR  COMPREHENSIVE METABOLIC PANEL  HCG, SERUM, QUALITATIVE  LIPID PANEL  HEMOGLOBIN A1C  CBG MONITORING, ED    EKG None  Radiology CT ANGIO HEAD NECK W WO CM (CODE STROKE) Result Date: 01/06/2024 CLINICAL DATA:  Neuro deficit, acute, stroke suspected. Central retinal artery occlusion suspected on the right. Loss of vision. EXAM: CT ANGIOGRAPHY HEAD AND NECK WITH AND WITHOUT CONTRAST TECHNIQUE: Multidetector CT imaging of the head and neck was performed using the standard protocol during bolus administration of intravenous contrast. Multiplanar CT image reconstructions and MIPs were obtained to evaluate the vascular anatomy. Carotid stenosis measurements (when applicable) are obtained utilizing NASCET criteria, using the distal internal carotid diameter as the denominator. RADIATION DOSE REDUCTION: This exam was performed according to the departmental dose-optimization program which includes automated exposure control, adjustment of the mA and/or kV according to patient size and/or use of iterative reconstruction technique. CONTRAST:  75mL OMNIPAQUE  IOHEXOL  350 MG/ML  SOLN COMPARISON:  Head CT same day prior CT angiography 01/08/2023 FINDINGS: CTA NECK FINDINGS Aortic arch: Aortic arch is normal.  Branching pattern is normal. Right carotid system: Common carotid artery widely patent to the bifurcation. Normal bifurcation. Normal cervical ICA. Left carotid system: Left carotid system similarly normal. Vertebral arteries: Proximal vertebral artery detail is limited because of poor contrast opacification and regional dense venous reflux. Beyond proximal extent, the vertebral arteries are patent through the cervical region to the foramen magnum. Skeleton: No significant bone abnormality. Other neck: No mass or lymphadenopathy. Upper chest: Early emphysematous change. No  focal finding otherwise. Review of the MIP images confirms the above findings CTA HEAD FINDINGS Anterior circulation: Both internal carotid arteries are patent through the skull base and siphon regions. No siphon stenosis. Minimal siphon atherosclerotic calcification. The anterior and middle cerebral vessels are patent. No large vessel occlusion or proximal stenosis. No aneurysm or vascular malformation. Posterior circulation: Both vertebral arteries are patent through the foramen magnum to the basilar artery. No basilar stenosis. Posterior circulation branch vessels are normal. Venous sinuses: Patent and normal. Anatomic variants: Azygous anterior cerebral artery. Review of the MIP images confirms the above findings IMPRESSION: 1. No intracranial large vessel occlusion or proximal stenosis. 2. Normal carotid bifurcations. 3. Proximal vertebral artery detail is limited because of poor arterial contrast opacification and regional dense venous reflux. Beyond proximal extent, the vertebral arteries are patent through the cervical region to the foramen magnum. 4. Early emphysematous change in the upper lungs. Electronically Signed   By: Oneil Officer M.D.   On: 01/06/2024 20:18   CT HEAD CODE STROKE WO CONTRAST Result Date: 01/06/2024 CLINICAL DATA:  Code stroke. Initial evaluation for acute neuro deficit, stroke suspected. EXAM: CT HEAD WITHOUT CONTRAST TECHNIQUE: Contiguous axial images were obtained from the base of the skull through the vertex without intravenous contrast. RADIATION DOSE REDUCTION: This exam was performed according to the departmental dose-optimization program which includes automated exposure control, adjustment of the mA and/or kV according to patient size and/or use of iterative reconstruction technique. COMPARISON:  Prior study from 01/08/2023 FINDINGS: Brain: Cerebral volume within normal limits. Chronic right basal ganglia lacunar infarct. No acute intracranial hemorrhage. No acute cortically  based infarct. No mass lesion, midline shift or mass effect. No hydrocephalus or extra-axial fluid collection. Vascular: No abnormal hyperdense vessel. Skull: Scalp soft tissues demonstrate no acute finding. 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 Calvarium otherwise intact. Sinuses/Orbits: Globes and orbital soft tissues within normal limits. Changes of chronic maxillary sinusitis, right greater than left. No mastoid effusion. Other: None. ASPECTS [facility] Stroke Program Early CT Score) - Ganglionic level infarction (caudate, lentiform nuclei, internal capsule, insula, M1-M3 cortex): 7 - Supraganglionic infarction (M4-M6 cortex): 3 Total score (0-10 with 10 being normal): 10 IMPRESSION: 1. No acute intracranial abnormality. 2. ASPECTS is 10. 3. Chronic right basal ganglia lacunar infarct. These results were communicated to Dr. Arora at 8:00 pm on 01/06/2024 by text page via the [facility] messaging system. Electronically Signed   By: Morene Hoard M.D.   On: 01/06/2024 20:05    Procedures .Critical Care  Performed by: Edie Rosebud LABOR, PA-C Authorized by: Edie Rosebud LABOR, PA-C   Critical care provider statement:    Critical care time (minutes):  35   Critical care was necessary to treat or prevent imminent or life-threatening deterioration of the following conditions:  CNS failure or compromise   Critical care was time spent personally  by me on the following activities:  Development of treatment plan with patient or surrogate, discussions with consultants, evaluation of patient's response to treatment, examination of patient, ordering and review of laboratory studies, ordering and review of radiographic studies, ordering and performing treatments and interventions, pulse oximetry, re-evaluation of patient's condition and review of old charts     Medications Ordered in ED Medications   stroke: early stages of recovery book (has no  administration in time range)  0.9 %  sodium chloride  infusion ( Intravenous New Bag/Given 01/06/24 2053)  acetaminophen  (TYLENOL ) tablet 650 mg (has no administration in time range)    Or  acetaminophen  (TYLENOL ) 160 MG/5ML solution 650 mg (has no administration in time range)    Or  acetaminophen  (TYLENOL ) suppository 650 mg (has no administration in time range)  senna-docusate (Senokot-S) tablet 1 tablet (has no administration in time range)  pantoprazole  (PROTONIX ) injection 40 mg (40 mg Intravenous Given 01/06/24 2122)  tenecteplase  (TNKASE ) injection for Stroke 25 mg (25 mg Intravenous Given 01/06/24 1959)  iohexol  (OMNIPAQUE ) 350 MG/ML injection 75 mL (75 mLs Intravenous Contrast Given 01/06/24 2010)    ED Course/ Medical Decision Making/ A&P Clinical Course as of 01/06/24 2222  Wed Jan 06, 2024  1943 Code stroke per neuro and will need MRI -- CT scanner 2 [CP]  2041 TNKase  CVA. [CC]    Clinical Course User Index [CC] Jerral Meth, MD [CP] Rosan Sherlean DEL, PA-C   43 year old here for evaluation sudden onset right vision changes, mild associated headache.  Symptoms have improved on arrival to the ED.  Code stroke was called from triage.  I saw patient while in the CT scanner.  She had been given TNK as cannot rule out ophthalmic artery occlusion given her symptoms.  When I assessed patient she states she still has some slight blurriness to her right eye.  Low suspicion for acute angle glaucoma, retinal detachment, infection, corneal ulceration, globe rupture  Labs and imaging personally viewed interpreted UA negative for infection UDS positive for THC CBC leukocytosis 12.8 Metabolic panel without significant abnormality CTA head, CT head without acute abnormality  Patient received TNK for trauma initial evaluation with neurology.  She will admitted to ICU for further management and workup.  The patient appears reasonably stabilized for admission considering the current  resources, flow, and capabilities available in the ED at this time, and I doubt any other [facility] requiring further screening and/or treatment in the ED prior to admission.                                  Medical Decision Making Amount and/or Complexity of Data Reviewed External Data Reviewed: labs, radiology, ECG and notes. Labs: ordered. Decision-making details documented in ED Course. Radiology: ordered and independent interpretation performed. Decision-making details documented in ED Course. ECG/medicine tests: ordered and independent interpretation performed. Decision-making details documented in ED Course.  Risk OTC drugs. Prescription drug management. Parenteral controlled substances. Decision regarding hospitalization. Diagnosis or treatment significantly limited by social determinants of health.           Final Clinical Impression(s) / ED Diagnoses Final diagnoses:  Central retinal artery occlusion of right eye    Rx / DC Orders ED Discharge Orders     None         Liron Eissler A, PA-C 01/06/24 2222    Jerral Meth, MD 01/06/24 2230

## 2024-01-06 NOTE — ED Provider Triage Note (Signed)
 Emergency Medicine Provider Triage Evaluation Note  [name] Costa , a 43 y.o. female  was evaluated in triage.  Pt complains of sudden right sided visual field deficit at 1800.  Reports lasted about 1 minute.  Still feels blurry on that side.  Light sensitive on that side.  Headache on that side.  Reports some twitching sensation.  Does endorse a history of migraines but never had one that felt like this.  Had a previous stroke with sudden onset left-sided hemiparesis, no headache associated at that time.  Still taking her aspirin .  She reports she cannot blink on that side but that is not new for her.  Review of Systems  Positive: Visual field deficit Negative: Numbness, tingling  Physical Exam  BP (!) 150/96 (BP Location: Right Arm)   Pulse 69   Temp 98 F (36.7 C)   Resp 17   Wt 106.1 kg   SpO2 100%   BMI 33.58 kg/m  Gen:   Awake, no distress   Resp:  Normal effort  MSK:   Moves extremities without difficulty  Other:  No visual field deficit on my exam.  Intact extraocular movements.  She cannot wink on the right's but she reports this is not new for her.  Facial muscles are equal bilaterally.  Medical Decision Making  Medically screening exam initiated at 7:48 PM.  Appropriate orders placed.  [name] Costa was informed that the remainder of the evaluation will be completed by another provider, this initial triage assessment does not replace that evaluation, and the importance of remaining in the ED until their evaluation is complete.  Spoke with the neurologist on-call who given patient's high risk history did want to activate a code stroke.  Patient escorted to CT scanner 2, clerk to activate.   Rosan Sherlean DEL, [place] 01/06/24 1950

## 2024-01-06 NOTE — ED Notes (Signed)
 Patient transported to CT

## 2024-01-06 NOTE — Patient Instructions (Addendum)
 Shoulder Exercises Ask your health care provider which exercises are safe for you. Do exercises exactly as told by your health care provider and adjust them as directed. It is normal to feel mild stretching, pulling, tightness, or discomfort as you do these exercises. Stop right away if you feel sudden pain or your pain gets worse. Do not begin these exercises until told by your health care provider. Stretching exercises External rotation and abduction This exercise is sometimes called corner stretch. The exercise rotates your arm outward (external rotation) and moves your arm out from your body (abduction). Stand in a doorway with one of your feet slightly in front of the other. This is called a staggered stance. If you cannot reach your forearms to the door frame, stand facing a corner of a room. Choose one of the following positions as told by your health care provider: Place your hands and forearms on the door frame above your head. Place your hands and forearms on the door frame at the height of your head. Place your hands on the door frame at the height of your elbows. Slowly move your weight onto your front foot until you feel a stretch across your chest and in the front of your shoulders. Keep your head and chest upright and keep your abdominal muscles tight. Hold for __________ seconds. To release the stretch, shift your weight to your back foot. Repeat __________ times. Complete this exercise __________ times a day. Extension, standing  Stand and hold a broomstick, a cane, or a similar object behind your back. Your hands should be a little wider than shoulder-width apart. Your palms should face away from your back. Keeping your elbows straight and your shoulder muscles relaxed, move the stick away from your body until you feel a stretch in your shoulders (extension). Avoid shrugging your shoulders while you move the stick. Keep your shoulder blades tucked down toward the middle of your  back. Hold for __________ seconds. Slowly return to the starting position. Repeat __________ times. Complete this exercise __________ times a day. Range-of-motion exercises Pendulum  Stand near a wall or a surface that you can hold onto for balance. Bend at the waist and let your left / right arm hang straight down. Use your other arm to support you. Keep your back straight and do not lock your knees. Relax your left / right arm and shoulder muscles, and move your hips and your trunk so your left / right arm swings freely. Your arm should swing because of the motion of your body, not because you are using your arm or shoulder muscles. Keep moving your hips and trunk so your arm swings in the following directions, as told by your health care provider: Side to side. Forward and backward. In clockwise and counterclockwise circles. Continue each motion for __________ seconds, or for as long as told by your health care provider. Slowly return to the starting position. Repeat __________ times. Complete this exercise __________ times a day. Shoulder flexion, standing  Stand and hold a broomstick, a cane, or a similar object. Place your hands a little more than shoulder-width apart on the object. Your left / right hand should be palm-up, and your other hand should be palm-down. Keep your elbow straight and your shoulder muscles relaxed. Push the stick up with your healthy arm to raise your left / right arm in front of your body, and then over your head until you feel a stretch in your shoulder (flexion). Avoid shrugging your shoulder  while you raise your arm. Keep your shoulder blade tucked down toward the middle of your back. Hold for __________ seconds. Slowly return to the starting position. Repeat __________ times. Complete this exercise __________ times a day. Shoulder abduction, standing  Stand and hold a broomstick, a cane, or a similar object. Place your hands a little more than  shoulder-width apart on the object. Your left / right hand should be palm-up, and your other hand should be palm-down. Keep your elbow straight and your shoulder muscles relaxed. Push the object across your body toward your left / right side. Raise your left / right arm to the side of your body (abduction) until you feel a stretch in your shoulder. Do not raise your arm above shoulder height unless your health care provider tells you to do that. If directed, raise your arm over your head. Avoid shrugging your shoulder while you raise your arm. Keep your shoulder blade tucked down toward the middle of your back. Hold for __________ seconds. Slowly return to the starting position. Repeat __________ times. Complete this exercise __________ times a day. Internal rotation  Place your left / right hand behind your back, palm-up. Use your other hand to dangle an exercise band, a broomstick, or a similar object over your shoulder. Grasp the band with your left / right hand so you are holding on to both ends. Gently pull up on the band until you feel a stretch in the front of your left / right shoulder. The movement of your arm toward the center of your body is called internal rotation. Avoid shrugging your shoulder while you raise your arm. Keep your shoulder blade tucked down toward the middle of your back. Hold for __________ seconds. Release the stretch by letting go of the band and lowering your hands. Repeat __________ times. Complete this exercise __________ times a day. Strengthening exercises External rotation  Sit in a stable chair without armrests. Secure an exercise band to a stable object at elbow height on your left / right side. Place a soft object, such as a folded towel or a small pillow, between your left / right upper arm and your body to move your elbow about 4 inches (10 cm) away from your side. Hold the end of the exercise band so it is tight and there is no slack. Keeping your  elbow pressed against the soft object, slowly move your forearm out, away from your abdomen (external rotation). Keep your body steady so only your forearm moves. Hold for __________ seconds. Slowly return to the starting position. Repeat __________ times. Complete this exercise __________ times a day. Shoulder abduction  Sit in a stable chair without armrests, or stand up. Hold a __________ lb / kg weight in your left / right hand, or hold an exercise band with both hands. Start with your arms straight down and your left / right palm facing in, toward your body. Slowly lift your left / right hand out to your side (abduction). Do not lift your hand above shoulder height unless your health care provider tells you that this is safe. Keep your arms straight. Avoid shrugging your shoulder while you do this movement. Keep your shoulder blade tucked down toward the middle of your back. Hold for __________ seconds. Slowly lower your arm, and return to the starting position. Repeat __________ times. Complete this exercise __________ times a day. Shoulder extension  Sit in a stable chair without armrests, or stand up. Secure an exercise band to a  stable object in front of you so it is at shoulder height. Hold one end of the exercise band in each hand. Straighten your elbows and lift your hands up to shoulder height. Squeeze your shoulder blades together as you pull your hands down to the sides of your thighs (extension). Stop when your hands are straight down by your sides. Do not let your hands go behind your body. Hold for __________ seconds. Slowly return to the starting position. Repeat __________ times. Complete this exercise __________ times a day. Shoulder row  Sit in a stable chair without armrests, or stand up. Secure an exercise band to a stable object in front of you so it is at chest height. Hold one end of the exercise band in each hand. Position your palms so that your thumbs are  facing the ceiling (neutral position). Bend each of your elbows to a 90-degree angle (right angle) and keep your upper arms at your sides. Step back or move the chair back until the band is tight and there is no slack. Slowly pull your elbows back behind you. Hold for __________ seconds. Slowly return to the starting position. Repeat __________ times. Complete this exercise __________ times a day. Shoulder press-ups  Sit in a stable chair that has armrests. Sit upright, with your feet flat on the floor. Put your hands on the armrests so your elbows are bent and your fingers are pointing forward. Your hands should be about even with the sides of your body. Push down on the armrests and use your arms to lift yourself off the chair. Straighten your elbows and lift yourself up as much as you comfortably can. Move your shoulder blades down, and avoid letting your shoulders move up toward your ears. Keep your feet on the ground. As you get stronger, your feet should support less of your body weight as you lift yourself up. Hold for __________ seconds. Slowly lower yourself back into the chair. Repeat __________ times. Complete this exercise __________ times a day. Wall push-ups  Stand so you are facing a stable wall. Your feet should be about one arm-length away from the wall. Lean forward and place your palms on the wall at shoulder height. Keep your feet flat on the floor as you bend your elbows and lean forward toward the wall. Hold for __________ seconds. Straighten your elbows to push yourself back to the starting position. Repeat __________ times. Complete this exercise __________ times a day. This information is not intended to replace advice given to you by your health care provider. Make sure you discuss any questions you have with your health care provider. Document Revised: 01/07/2022 Document Reviewed: 01/07/2022 Elsevier Patient Education  2024 [employer]  Managing the  Challenge of Quitting Smoking Quitting smoking is a physical and mental challenge. You may have cravings, withdrawal symptoms, and temptation to smoke. Before quitting, work with your health care provider to make a plan that can help you manage quitting. Making a plan before you quit may keep you from smoking when you have the urge to smoke while trying to quit. How to manage lifestyle changes Managing stress Stress can make you want to smoke, and wanting to smoke may cause stress. It is important to find ways to manage your stress. You could try some of the following: Practice relaxation techniques. Breathe slowly and deeply, in through your nose and out through your mouth. Listen to music. Soak in a bath or take a shower. Imagine a peaceful place or  vacation. Get some support. Talk with family or friends about your stress. Join a support group. Talk with a counselor or therapist. Get some physical activity. Go for a walk, run, or bike ride. Play a favorite sport. Practice yoga.  Medicines Talk with your health care provider about medicines that might help you deal with cravings and make quitting easier for you. Relationships Social situations can be difficult when you are quitting smoking. To manage this, you can: Avoid parties and other social situations where people might be smoking. Avoid alcohol. Leave right away if you have the urge to smoke. Explain to your family and friends that you are quitting smoking. Ask for support and let them know you might be a bit grumpy. Plan activities where smoking is not an option. General instructions Be aware that many people gain weight after they quit smoking. However, not everyone does. To keep from gaining weight, have a plan in place before you quit, and stick to the plan after you quit. Your plan should include: Eating healthy snacks. When you have a craving, it may help to: Eat popcorn, or try carrots, celery, or other cut  vegetables. Chew sugar-free gum. Changing how you eat. Eat small portion sizes at meals. Eat 4-6 small meals throughout the day instead of 1-2 large meals a day. Be mindful when you eat. You should avoid watching television or doing other things that might distract you as you eat. Exercising regularly. Make time to exercise each day. If you do not have time for a long workout, do short bouts of exercise for 5-10 minutes several times a day. Do some form of strengthening exercise, such as weight lifting. Do some exercise that gets your heart beating and causes you to breathe deeply, such as walking fast, running, swimming, or biking. This is very important. Drinking plenty of water or other low-calorie or no-calorie drinks. Drink enough fluid to keep your urine pale yellow.  How to recognize withdrawal symptoms Your body and mind may experience discomfort as you try to get used to not having nicotine in your system. These effects are called withdrawal symptoms. They may include: Feeling hungrier than normal. Having trouble concentrating. Feeling irritable or restless. Having trouble sleeping. Feeling depressed. Craving a cigarette. These symptoms may surprise you, but they are normal to have when quitting smoking. To manage withdrawal symptoms: Avoid places, people, and activities that trigger your cravings. Remember why you want to quit. Get plenty of sleep. Avoid coffee and other drinks that contain caffeine. These may worsen some of your symptoms. How to manage cravings Come up with a plan for how to deal with your cravings. The plan should include the following: A definition of the specific situation you want to deal with. An activity or action you will take to replace smoking. A clear idea for how this action will help. The name of someone who could help you with this. Cravings usually last for 5-10 minutes. Consider taking the following actions to help you with your plan to deal  with cravings: Keep your mouth busy. Chew sugar-free gum. Suck on hard candies or a straw. Brush your teeth. Keep your hands and body busy. Change to a different activity right away. Squeeze or play with a ball. Do an activity or a hobby, such as making bead jewelry, practicing needlepoint, or working with wood. Mix up your normal routine. Take a short exercise break. Go for a quick walk, or run up and down stairs. Focus on doing something  kind or helpful for someone else. Call a friend or family member to talk during a craving. Join a support group. Contact a quitline. Where to find support To get help or find a support group: Call the National Cancer Institute's Smoking Quitline: 1-800-QUIT-NOW 445-446-2705) Text QUIT to SmokefreeTXT: 521151 Where to find more information Visit these websites to find more information on quitting smoking: U.[place]. Department of Health and Human Services: www.smokefree.gov American Lung Association: www.freedomfromsmoking.org Centers for Disease Control and Prevention (CDC): footballexhibition.com.br American Heart Association: www.heart.org Contact a health care provider if: You want to change your plan for quitting. The medicines you are taking are not helping. Your eating feels out of control or you cannot sleep. You feel depressed or become very anxious. Summary Quitting smoking is a physical and mental challenge. You will face cravings, withdrawal symptoms, and temptation to smoke again. Preparation can help you as you go through these challenges. Try different techniques to manage stress, handle social situations, and prevent weight gain. You can deal with cravings by keeping your mouth busy (such as by chewing gum), keeping your hands and body busy, calling family or friends, or contacting a quitline for people who want to quit smoking. You can deal with withdrawal symptoms by avoiding places where people smoke, getting plenty of rest, and avoiding drinks that  contain caffeine. This information is not intended to replace advice given to you by your health care provider. Make sure you discuss any questions you have with your health care provider. Document Revised: 11/08/2021 Document Reviewed: 11/08/2021 Elsevier Patient Education  2024 Elsevier Inc.  Heart Disease Prevention   Your inflammatory disease increases your risk of heart disease which includes heart attack, stroke, atrial fibrillation (irregular heartbeats), high blood pressure, heart failure and atherosclerosis (plaque in the arteries).  It is important to reduce your risk by:   Keep blood pressure, cholesterol, and blood sugar at healthy levels   Smoking Cessation   Maintain a healthy weight  BMI 20-25   Eat a healthy diet  Plenty of fresh fruit, vegetables, and whole grains  Limit saturated fats, foods high in sodium, and added sugars  DASH and Mediterranean diet   Increase physical activity  Recommend moderate physically activity for 150 minutes per week/ 30 minutes a day for five days a week These can be broken up into three separate ten-minute sessions during the day.   Reduce Stress  Meditation, slow breathing exercises, yoga, coloring books  Dental visits twice a year

## 2024-01-07 ENCOUNTER — Inpatient Hospital Stay (HOSPITAL_COMMUNITY): Payer: MEDICAID

## 2024-01-07 ENCOUNTER — Ambulatory Visit: Payer: 59 | Admitting: Neurology

## 2024-01-07 DIAGNOSIS — H3411 Central retinal artery occlusion, right eye: Secondary | ICD-10-CM

## 2024-01-07 LAB — ECHOCARDIOGRAM COMPLETE
AR max vel: 1.59 cm2
AV Area VTI: 1.51 cm2
AV Area mean vel: 1.56 cm2
AV Mean grad: 5 mm[Hg]
AV Peak grad: 9.7 mm[Hg]
Ao pk vel: 1.56 m/s
Area-P 1/2: 2.44 cm2
Calc EF: 68.2 %
Height: 71 in
MV VTI: 1.83 cm2
S' Lateral: 2.7 cm
Single Plane A2C EF: 68.2 %
Single Plane A4C EF: 64.4 %
Weight: 3647.29 [oz_av]

## 2024-01-07 LAB — HEMOGLOBIN A1C
Hgb A1c MFr Bld: 5.6 % (ref 4.8–5.6)
Mean Plasma Glucose: 114.02 mg/dL

## 2024-01-07 LAB — LIPID PANEL
Cholesterol: 102 mg/dL (ref 0–200)
HDL: 41 mg/dL (ref >40)
LDL Cholesterol: 48 mg/dL (ref 0–99)
Total CHOL/HDL Ratio: 2.5 {ratio}
Triglycerides: 66 mg/dL (ref <150)
VLDL: 13 mg/dL (ref 0–40)

## 2024-01-07 LAB — MRSA NEXT GEN BY PCR, NASAL: MRSA by PCR Next Gen: NOT DETECTED

## 2024-01-07 MED ORDER — ESCITALOPRAM OXALATE 10 MG PO TABS
10.0000 mg | ORAL_TABLET | Freq: Every day | ORAL | Status: DC
  Administered 2024-01-07 – 2024-01-08 (×2): 10 mg via ORAL
  Filled 2024-01-07 (×2): qty 1

## 2024-01-07 MED ORDER — ATORVASTATIN CALCIUM 40 MG PO TABS
40.0000 mg | ORAL_TABLET | Freq: Every day | ORAL | Status: DC
  Administered 2024-01-07 – 2024-01-08 (×2): 40 mg via ORAL
  Filled 2024-01-07 (×2): qty 1

## 2024-01-07 MED ORDER — CHLORHEXIDINE GLUCONATE CLOTH 2 % EX PADS
6.0000 | MEDICATED_PAD | Freq: Every day | CUTANEOUS | Status: DC
  Administered 2024-01-07 – 2024-01-08 (×2): 6 via TOPICAL

## 2024-01-07 MED ORDER — WARFARIN SODIUM 10 MG PO TABS
10.0000 mg | ORAL_TABLET | Freq: Once | ORAL | Status: AC
  Administered 2024-01-07: 10 mg via ORAL
  Filled 2024-01-07: qty 1

## 2024-01-07 MED ORDER — ENOXAPARIN SODIUM 100 MG/ML IJ SOSY
100.0000 mg | PREFILLED_SYRINGE | Freq: Two times a day (BID) | INTRAMUSCULAR | Status: DC
  Administered 2024-01-07 – 2024-01-08 (×2): 100 mg via SUBCUTANEOUS
  Filled 2024-01-07 (×3): qty 1

## 2024-01-07 MED ORDER — ORAL CARE MOUTH RINSE: 15.0000 mL | OROMUCOSAL | Status: DC | PRN

## 2024-01-07 MED ORDER — WARFARIN - PHARMACIST DOSING INPATIENT: Freq: Every day | Status: DC

## 2024-01-07 MED ORDER — ASPIRIN 81 MG PO TBEC
81.0000 mg | DELAYED_RELEASE_TABLET | Freq: Every day | ORAL | Status: DC
Start: 2024-01-07 — End: 2024-01-08
  Administered 2024-01-07 – 2024-01-08 (×2): 81 mg via ORAL
  Filled 2024-01-07 (×2): qty 1

## 2024-01-07 MED ORDER — ASPIRIN 81 MG PO TBEC: 81.0000 mg | DELAYED_RELEASE_TABLET | Freq: Every day | ORAL | Status: DC

## 2024-01-07 NOTE — Progress Notes (Signed)
 PHARMACY - ANTICOAGULATION CONSULT NOTE  Pharmacy Consult for Lovenox /Warfarin Indication: Antiphospholipid syndrome  Allergies  Allergen Reactions   Latex Rash    Patient Measurements: Height: 5' 11 (180.3 cm) Weight: 103.4 kg (227 lb 15.3 oz) IBW/kg (Calculated) : 70.8  Vital Signs: Temp: 98.5 F (36.9 C) (02/06 1600) Temp Source: Oral (02/06 1600) BP: 127/115 (02/06 1900) Pulse Rate: 65 (02/06 1300)  Labs: Recent Labs    01/06/24 1940 01/06/24 1943  HGB 12.9 12.5  HCT 38.0 39.2  PLT  --  278  APTT  --  38*  LABPROT  --  13.7  INR  --  1.0  CREATININE 0.60 0.62    Estimated Creatinine Clearance: 118.7 mL/min (by C-G formula based on SCr of 0.62 mg/dL).   Medical History: Past Medical History:  Diagnosis Date   Bladder infection    Chest wall pain    PCOS (polycystic ovarian syndrome)    Stroke (HCC) 06/04/2021   Trichomonas     Medications:  Scheduled:   aspirin  EC  81 mg Oral Daily   atorvastatin   40 mg Oral Daily   Chlorhexidine  Gluconate Cloth  6 each Topical Daily   escitalopram   10 mg Oral Daily   Infusions:  PRN: acetaminophen  **OR** acetaminophen  (TYLENOL ) oral liquid 160 mg/5 mL **OR** acetaminophen , mouth rinse, senna-docusate  Assessment: 43 yo female with hx R basal ganglia infarct, ANA positive, anticardiolipin antibody positive, admitted with stroke s/p TNK 2/5 at 19:55. Neurology consulted Pharmacy to begin warfarin 2/6 with lovenox  bridge.  INR 1.0, CBC wnl, SCr 0.62  Goal of Therapy:  INR 2-3 Anti-Xa level 0.6-1 units/ml 4hrs after LMWH dose given Monitor platelets by anticoagulation protocol: Yes   Plan:  Lovenox  100mg  SQ q12h Warfarin 10mg  PO x 1 tonight Daily PT/INR CBC in AM  Rocky Slade, PharmD, BCPS 01/07/2024,7:14 PM  Please check AMION for all [facility] Pharmacy phone numbers After 10:00 PM, call Main Pharmacy [phone]

## 2024-01-07 NOTE — Evaluation (Signed)
 Physical Therapy Evaluation and Discharge Patient Details Name: [name] Costa MRN: [id number] DOB: 11/13/1981 Today's Date: 01/07/2024  History of Present Illness  43 y.o. female presented to the emergency department 01/06/24 for evaluation of sudden onset of vision loss in the right eye that was complete and lasted for about a minute and then the vision started getting somewhat blurred.   PMH right basal ganglia infarct with no residual deficits in 2022, positive ANA and anticardiolipin antibodies being followed by rheumatology  Clinical Impression   Patient evaluated by Physical Therapy with no further acute PT needs identified. Pt scored 24/24 on DGI. PT is signing off. Thank you for this referral.         If plan is discharge home, recommend the following:     Can travel by private vehicle        Equipment Recommendations None recommended by PT  Recommendations for Other Services       Functional Status Assessment Patient has not had a recent decline in their functional status     Precautions / Restrictions Precautions Precautions: None      Mobility  Bed Mobility Overal bed mobility: Independent                  Transfers Overall transfer level: Independent                      Ambulation/Gait Ambulation/Gait assistance: Independent Gait Distance (Feet): 300 Feet Assistive device: None Gait Pattern/deviations: WFL(Within Functional Limits)   Gait velocity interpretation: >4.37 ft/sec, indicative of normal walking speed   General Gait Details: see DGI  Stairs            Wheelchair Mobility     Tilt Bed    Modified Rankin (Stroke Patients Only) Modified Rankin (Stroke Patients Only) Pre-Morbid Rankin Score: No symptoms Modified Rankin: No symptoms     Balance Overall balance assessment: Independent                               Standardized Balance Assessment Standardized Balance Assessment : Dynamic Gait  Index   Dynamic Gait Index Level Surface: Normal Change in Gait Speed: Normal Gait with Horizontal Head Turns: Normal Gait with Vertical Head Turns: Normal Gait and Pivot Turn: Normal Step Over Obstacle: Normal Step Around Obstacles: Normal Steps: Normal Total Score: 24       Pertinent Vitals/Pain Pain Assessment Pain Assessment: No/denies pain    Home Living Family/patient expects to be discharged to:: Private residence                        Prior Function Prior Level of Function : Independent/Modified Independent                     Extremity/Trunk Assessment   Upper Extremity Assessment Upper Extremity Assessment: Overall WFL for tasks assessed    Lower Extremity Assessment Lower Extremity Assessment: Overall WFL for tasks assessed    Cervical / Trunk Assessment Cervical / Trunk Assessment: Normal  Communication   Communication Communication: No apparent difficulties  Cognition Arousal: Alert Behavior During Therapy: WFL for tasks assessed/performed Overall Cognitive Status: Within Functional Limits for tasks assessed  General Comments General comments (skin integrity, edema, etc.): VSS per ICU monitor    Exercises     Assessment/Plan    PT Assessment Patient does not need any further PT services  PT Problem List         PT Treatment Interventions      PT Goals (Current goals can be found in the Care Plan section)  Acute Rehab PT Goals PT Goal Formulation: All assessment and education complete, DC therapy    Frequency       Co-evaluation               AM-PAC PT 6 Clicks Mobility  Outcome Measure Help needed turning from your back to your side while in a flat bed without using bedrails?: None Help needed moving from lying on your back to sitting on the side of a flat bed without using bedrails?: None Help needed moving to and from a bed to a chair (including  a wheelchair)?: None Help needed standing up from a chair using your arms (e.g., wheelchair or bedside chair)?: None Help needed to walk in hospital room?: None Help needed climbing 3-5 steps with a railing? : None 6 Click Score: 24    End of Session Equipment Utilized During Treatment: Gait belt Activity Tolerance: Patient tolerated treatment well Patient left: in chair;with call bell/phone within reach;with family/visitor present Nurse Communication: Mobility status;Other (comment) (no further PT needs) PT Visit Diagnosis: Difficulty in walking, not elsewhere classified (R26.2)    Time: 8954-8942 PT Time Calculation (min) (ACUTE ONLY): 12 min   Charges:   PT Evaluation $PT Eval Low Complexity: 1 Low   PT General Charges $$ ACUTE PT VISIT: 1 Visit          Macario RAMAN, PT Acute Rehabilitation Services  Office [phone]   Macario SHAUNNA Soja 01/07/2024, 11:03 AM

## 2024-01-07 NOTE — Progress Notes (Signed)
 PT Cancellation Note  Patient Details Name: [name] Costa MRN: [id number] DOB: Jun 05, 1981   Cancelled Treatment:    Reason Eval/Treat Not Completed: Active bedrest order  Will follow-up after Neurology sees if activity allowed.    Macario RAMAN, PT Acute Rehabilitation Services  Office [phone]  Macario SHAUNNA Soja 01/07/2024, 7:44 AM

## 2024-01-07 NOTE — Progress Notes (Signed)
 OT Cancellation Note  Patient Details Name: [name] Costa MRN: [id number] DOB: 30-Mar-1981   Cancelled Treatment:    Reason Eval/Treat Not Completed: Active bedrest order.  Will check back as able and proceed with OT eval once pt is cleared from bedrest.    Lynwood Constant, OTR/L Acute Rehabilitation Services  Office [phone] 01/07/2024

## 2024-01-07 NOTE — Progress Notes (Signed)
 OT Cancellation Note  Patient Details Name: [name] Costa MRN: [id number] DOB: [birth year]   Cancelled Treatment:    Reason Eval/Treat Not Completed: OT screened, no needs identified, will sign off.  Pt currently independent with all selfcare and mobility.  Right eye blurriness is gone and pt reports no further issues with vision.      Casey Fye OTR/L 01/07/2024, 11:19 AM

## 2024-01-07 NOTE — TOC Initial Note (Signed)
 Transition of Care [facility]) - Initial/Assessment Note    Patient Details  Name: [name] Costa MRN: [id number] Date of Birth: 1981/02/22  Transition of Care [facility] CM/SW Contact:    Jeoffrey LITTIE Moose, Student-Social Work Phone Number: 01/07/2024, 3:22 PM  Clinical Narrative:                 Pt admitted from home. Pt is uninsured. TOC following for needs.        Patient Goals and CMS Choice            Expected Discharge Plan and Services       Living arrangements for the past 2 months: Apartment                                      Prior Living Arrangements/Services Living arrangements for the past 2 months: Apartment Lives with:: Self                   Activities of Daily Living      Permission Sought/Granted                  Emotional Assessment       Orientation: : Oriented to Situation, Oriented to  Time, Oriented to Self, Oriented to Place Alcohol / Substance Use: Tobacco Use    Admission diagnosis:  Central retinal artery occlusion of right eye [H34.11] Patient Active Problem List   Diagnosis Date Noted   Central retinal artery occlusion of right eye 01/06/2024   Primary hypertension 12/23/2023   Vitamin D  deficiency 12/23/2023   Infarction of right basal ganglia (HCC) 06/07/2021   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/04/2021   TOBACCO ABUSE 10/15/2009   ALLERGIC RHINITIS 10/15/2009   ASTHMA 10/15/2009   PCP:  [facility] Pharmacy:   [id number] - [place]), [place] - [address] [place]) [place] [address] Phone: [phone] Fax: [phone]     Social Drivers of Health (SDOH) Social History: SDOH Screenings   Food Insecurity: No Food Insecurity (01/07/2024)  Housing: Low Risk  (01/07/2024)  Transportation Needs: No Transportation Needs (01/07/2024)  Utilities: Not At Risk (01/07/2024)  Depression (PHQ2-9): Low Risk  (05/28/2023)  Tobacco Use: High Risk  (01/06/2024)   SDOH Interventions:     Readmission Risk Interventions     No data to display

## 2024-01-07 NOTE — Progress Notes (Addendum)
 STROKE TEAM PROGRESS NOTE    SIGNIFICANT HOSPITAL EVENTS 2/5 patient admitted with right CRAO and administered TNK  INTERIM HISTORY/SUBJECTIVE Patient reports that her vision has returned to normal.  Visual fields full in bilateral eyes, tested together and separately.  Blood pressure has remained within parameters without interventions.   OBJECTIVE  CBC    Component Value Date/Time   WBC 12.2 (H) 01/06/2024 1943   RBC 4.67 01/06/2024 1943   HGB 12.5 01/06/2024 1943   HCT 39.2 01/06/2024 1943   PLT 278 01/06/2024 1943   MCV 83.9 01/06/2024 1943   MCH 26.8 01/06/2024 1943   MCHC 31.9 01/06/2024 1943   RDW 17.7 (H) 01/06/2024 1943   LYMPHSABS 2.6 01/06/2024 1943   MONOABS 0.7 01/06/2024 1943   EOSABS 0.1 01/06/2024 1943   BASOSABS 0.0 01/06/2024 1943    BMET    Component Value Date/Time   NA 138 01/06/2024 1943   K 3.6 01/06/2024 1943   CL 105 01/06/2024 1943   CO2 22 01/06/2024 1943   GLUCOSE 85 01/06/2024 1943   BUN 7 01/06/2024 1943   CREATININE 0.62 01/06/2024 1943   CALCIUM  9.3 01/06/2024 1943   GFRNONAA >60 01/06/2024 1943    IMAGING past 24 hours ECHOCARDIOGRAM COMPLETE Result Date: 01/07/2024    ECHOCARDIOGRAM REPORT   Patient Name:   [name] Costa Date of Exam: 01/07/2024 Medical Rec #:  [id number]        Height:       71.0 in Accession #:    [id number]       Weight:       228.0 lb Date of Birth:  06-Apr-1981        BSA:          2.229 m Patient Age:    42 years         BP:           147/78 mmHg Patient Gender: F                HR:           60 bpm. Exam Location:  Inpatient Procedure: 2D Echo, Cardiac Doppler and Color Doppler Indications:    Stroke  History:        Patient has prior history of Echocardiogram examinations, most                 recent 06/04/2021. Risk Factors:Hypertension.  Sonographer:    Juanita Shaw Referring Phys: [id number] ASHISH ARORA IMPRESSIONS  1. Left ventricular ejection fraction, by estimation, is 60 to 65%. The left ventricle has normal  function. The left ventricle has no regional wall motion abnormalities. Left ventricular diastolic parameters were normal.  2. Right ventricular systolic function is normal. The right ventricular size is normal.  3. The mitral valve is normal in structure. No evidence of mitral valve regurgitation. No evidence of mitral stenosis.  4. The aortic valve is normal in structure. Aortic valve regurgitation is not visualized. No aortic stenosis is present.  5. The inferior vena cava is normal in size with greater than 50% respiratory variability, suggesting right atrial pressure of 3 mmHg. FINDINGS  Left Ventricle: Left ventricular ejection fraction, by estimation, is 60 to 65%. The left ventricle has normal function. The left ventricle has no regional wall motion abnormalities. The left ventricular internal cavity size was normal in size. There is  no left ventricular hypertrophy. Left ventricular diastolic parameters were normal. Right Ventricle: The right ventricular size is normal. No  increase in right ventricular wall thickness. Right ventricular systolic function is normal. Left Atrium: Left atrial size was normal in size. Right Atrium: Right atrial size was normal in size. Pericardium: There is no evidence of pericardial effusion. Mitral Valve: The mitral valve is normal in structure. No evidence of mitral valve regurgitation. No evidence of mitral valve stenosis. MV peak gradient, 2.8 mmHg. The mean mitral valve gradient is 1.0 mmHg. Tricuspid Valve: The tricuspid valve is normal in structure. Tricuspid valve regurgitation is not demonstrated. No evidence of tricuspid stenosis. Aortic Valve: The aortic valve is normal in structure. Aortic valve regurgitation is not visualized. No aortic stenosis is present. Aortic valve mean gradient measures 5.0 mmHg. Aortic valve peak gradient measures 9.7 mmHg. Aortic valve area, by VTI measures 1.51 cm. Pulmonic Valve: The pulmonic valve was normal in structure. Pulmonic valve  regurgitation is not visualized. No evidence of pulmonic stenosis. Aorta: The aortic root is normal in size and structure. Venous: The inferior vena cava is normal in size with greater than 50% respiratory variability, suggesting right atrial pressure of 3 mmHg. IAS/Shunts: No atrial level shunt detected by color flow Doppler.  LEFT VENTRICLE PLAX 2D LVIDd:         4.90 cm      Diastology LVIDs:         2.70 cm      LV e' medial:    8.05 cm/s LV PW:         0.80 cm      LV E/e' medial:  10.3 LV IVS:        0.80 cm      LV e' lateral:   14.40 cm/s LVOT diam:     1.80 cm      LV E/e' lateral: 5.8 LV SV:         51 LV SV Index:   23 LVOT Area:     2.54 cm  LV Volumes (MOD) LV vol d, MOD A2C: 127.0 ml LV vol d, MOD A4C: 108.0 ml LV vol s, MOD A2C: 40.4 ml LV vol s, MOD A4C: 38.5 ml LV SV MOD A2C:     86.6 ml LV SV MOD A4C:     108.0 ml LV SV MOD BP:      83.9 ml RIGHT VENTRICLE             IVC RV Basal diam:  4.10 cm     IVC diam: 2.10 cm RV Mid diam:    2.60 cm RV S prime:     12.70 cm/s TAPSE (M-mode): 2.5 cm LEFT ATRIUM             Index        RIGHT ATRIUM           Index LA diam:        3.60 cm 1.61 cm/m   RA Area:     15.70 cm LA Vol (A2C):   27.8 ml 12.47 ml/m  RA Volume:   44.00 ml  19.74 ml/m LA Vol (A4C):   23.7 ml 10.63 ml/m LA Biplane Vol: 26.8 ml 12.02 ml/m  AORTIC VALVE                     PULMONIC VALVE AV Area (Vmax):    1.59 cm      PV Vmax:       0.80 m/s AV Area (Vmean):   1.56 cm      PV Peak grad:  2.5 mmHg AV Area (VTI):     1.51 cm AV Vmax:           156.00 cm/s AV Vmean:          105.000 cm/s AV VTI:            0.337 m AV Peak Grad:      9.7 mmHg AV Mean Grad:      5.0 mmHg LVOT Vmax:         97.50 cm/s LVOT Vmean:        64.200 cm/s LVOT VTI:          0.200 m LVOT/AV VTI ratio: 0.59  AORTA Ao Root diam: 2.50 cm Ao Asc diam:  2.80 cm MITRAL VALVE MV Area (PHT): 2.44 cm    SHUNTS MV Area VTI:   1.83 cm    Systemic VTI:  0.20 m MV Peak grad:  2.8 mmHg    Systemic Diam: 1.80 cm MV Mean  grad:  1.0 mmHg MV Vmax:       0.83 m/s MV Vmean:      46.0 cm/s MV E velocity: 82.90 cm/s MV A velocity: 61.60 cm/s MV E/A ratio:  1.35 Kardie Tobb DO Electronically signed by Dub Huntsman DO Signature Date/Time: 01/07/2024/12:33:44 PM    Final    CT ANGIO HEAD NECK W WO CM (CODE STROKE) Result Date: 01/06/2024 CLINICAL DATA:  Neuro deficit, acute, stroke suspected. Central retinal artery occlusion suspected on the right. Loss of vision. EXAM: CT ANGIOGRAPHY HEAD AND NECK WITH AND WITHOUT CONTRAST TECHNIQUE: Multidetector CT imaging of the head and neck was performed using the standard protocol during bolus administration of intravenous contrast. Multiplanar CT image reconstructions and MIPs were obtained to evaluate the vascular anatomy. Carotid stenosis measurements (when applicable) are obtained utilizing NASCET criteria, using the distal internal carotid diameter as the denominator. RADIATION DOSE REDUCTION: This exam was performed according to the departmental dose-optimization program which includes automated exposure control, adjustment of the mA and/or kV according to patient size and/or use of iterative reconstruction technique. CONTRAST:  75mL OMNIPAQUE  IOHEXOL  350 MG/ML SOLN COMPARISON:  Head CT same day prior CT angiography 01/08/2023 FINDINGS: CTA NECK FINDINGS Aortic arch: Aortic arch is normal.  Branching pattern is normal. Right carotid system: Common carotid artery widely patent to the bifurcation. Normal bifurcation. Normal cervical ICA. Left carotid system: Left carotid system similarly normal. Vertebral arteries: Proximal vertebral artery detail is limited because of poor contrast opacification and regional dense venous reflux. Beyond proximal extent, the vertebral arteries are patent through the cervical region to the foramen magnum. Skeleton: No significant bone abnormality. Other neck: No mass or lymphadenopathy. Upper chest: Early emphysematous change. No focal finding otherwise. Review of  the MIP images confirms the above findings CTA HEAD FINDINGS Anterior circulation: Both internal carotid arteries are patent through the skull base and siphon regions. No siphon stenosis. Minimal siphon atherosclerotic calcification. The anterior and middle cerebral vessels are patent. No large vessel occlusion or proximal stenosis. No aneurysm or vascular malformation. Posterior circulation: Both vertebral arteries are patent through the foramen magnum to the basilar artery. No basilar stenosis. Posterior circulation branch vessels are normal. Venous sinuses: Patent and normal. Anatomic variants: Azygous anterior cerebral artery. Review of the MIP images confirms the above findings IMPRESSION: 1. No intracranial large vessel occlusion or proximal stenosis. 2. Normal carotid bifurcations. 3. Proximal vertebral artery detail is limited because of poor arterial contrast opacification and regional dense venous reflux. Beyond proximal extent, the  vertebral arteries are patent through the cervical region to the foramen magnum. 4. Early emphysematous change in the upper lungs. Electronically Signed   By: Oneil Officer M.D.   On: 01/06/2024 20:18   CT HEAD CODE STROKE WO CONTRAST Result Date: 01/06/2024 CLINICAL DATA:  Code stroke. Initial evaluation for acute neuro deficit, stroke suspected. EXAM: CT HEAD WITHOUT CONTRAST TECHNIQUE: Contiguous axial images were obtained from the base of the skull through the vertex without intravenous contrast. RADIATION DOSE REDUCTION: This exam was performed according to the departmental dose-optimization program which includes automated exposure control, adjustment of the mA and/or kV according to patient size and/or use of iterative reconstruction technique. COMPARISON:  Prior study from 01/08/2023 FINDINGS: Brain: Cerebral volume within normal limits. Chronic right basal ganglia lacunar infarct. No acute intracranial hemorrhage. No acute cortically based infarct. No mass lesion,  midline shift or mass effect. No hydrocephalus or extra-axial fluid collection. Vascular: No abnormal hyperdense vessel. Skull: Scalp soft tissues demonstrate no acute finding. 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 Calvarium otherwise intact. Sinuses/Orbits: Globes and orbital soft tissues within normal limits. Changes of chronic maxillary sinusitis, right greater than left. No mastoid effusion. Other: None. ASPECTS [facility] Stroke Program Early CT Score) - Ganglionic level infarction (caudate, lentiform nuclei, internal capsule, insula, M1-M3 cortex): 7 - Supraganglionic infarction (M4-M6 cortex): 3 Total score (0-10 with 10 being normal): 10 IMPRESSION: 1. No acute intracranial abnormality. 2. ASPECTS is 10. 3. Chronic right basal ganglia lacunar infarct. These results were communicated to Dr. Arora at 8:00 pm on 01/06/2024 by text page via the [facility] messaging system. Electronically Signed   By: Morene Hoard M.D.   On: 01/06/2024 20:05    Vitals:   01/07/24 1000 01/07/24 1100 01/07/24 1200 01/07/24 1300  BP: 121/82 134/82 139/79 133/87  Pulse: 71 72 62 65  Resp: 15 14 15  (!) 21  Temp:   99.3 F (37.4 C)   TempSrc:   Oral   SpO2: 100% 99% 99% 98%  Weight:      Height:         PHYSICAL EXAM General:  Alert, well-nourished, well-developed patient in no acute distress Psych:  Mood and affect appropriate for situation CV: Regular rate and rhythm on monitor Respiratory:  Regular, unlabored respirations on room air  NEURO:  Mental Status: AA&Ox3, patient is able to give clear and coherent history Speech/Language: speech is without dysarthria or aphasia.  Naming, repetition, fluency, and comprehension intact.  Cranial Nerves:  II: PERRL. Visual fields full in bilateral eyes, tested together and separately.  III, IV, VI: EOMI. Eyelids elevate symmetrically.  V: Sensation is intact to light touch and slightly diminished in left  V1, this is baseline from previous stroke VII: Face is symmetrical resting and smiling VIII: hearing intact to voice. IX, X:  Phonation is normal.  KP:Dynloizm shrug 5/5. XII: tongue is midline without fasciculations. Motor: 5/5 strength to all muscle groups tested.  Tone: is normal and bulk is normal Sensation- Intact to light touch bilaterally. Coordination: FTN intact bilaterally Gait- deferred  Most Recent NIH 0   ASSESSMENT/PLAN  [name] is a 43 y.o. female with history of right basal ganglia infarct in 2022, positive ANA and anticardiolipin antibodies followed by rheumatology admitted for sudden onset vision loss in the right eye.  Patient stated that her vision suddenly became blurred in the right eye, and she experienced a headache different from her usual migraine after  the vision loss occurred.    She was administered TNK for possible CRAO.She is being followed by rheumatology and was noted to have had elevated anticardiolipin IgM and beta-2  glycoprotein antibodies elevated as well.  This leads to a diagnosis of antiphospholipid syndrome.  Discussed with patient's outpatient rheumatologist, and will start patient on Coumadin  with goal INR of 2-3 tomorrow.  NIH on Admission 0  Right CRAO status post TNK, etiology: embolic in the setting of antiphospholipid syndrome Code Stroke CT head No acute abnormality.  Chronic right basal ganglia infarct ASPECTS 10.    CTA head & neck no LVO or hemodynamically significant stenosis MRI no acute infarct 2D Echo EF 60 to 65%, normal left atrial size, no atrial level shunt LDL 48 HgbA1c 5.6 UDS positive for THC VTE prophylaxis -SCDs aspirin  81 mg daily prior to admission, now on aspirin  81 and Lovenox  bridge to Coumadin  with goal INR 2-3. Therapy recommendations: None Disposition: Likely home tomorrow  Hx of Stroke/TIA 05/2021 patient had right basal ganglia stroke status post tPA.  EF 65 to 70%.  LDL 105, A1c 5.4.?  PFO on TEE  but TCD bubble study negative for PFO.  Cardiolipin antibody IgM 108.  Discharged on DAPT 08/2021 follow-up with Dr. Rosemarie at [facility], repeat cardiolipin IgM 54  Antiphospholipid syndrome Patient has 3 children, 2 abortion and 1 miscarriage 08/2023 seen by Dr. Dolphus at rheumatology, cardiolipin IgM >112, beta-2  glycoprotein IgM >112, ANA positive 1:80 Discussed with patient's outpatient rheumatologist, will follow-up as outpatient to start Plaquenil .  Will check C3, C4, ESR, repeat ANA, cardiolipin and beta-2  glycoprotein antibody per her rheumatologist. Now on aspirin  81 and Lovenox  bridge to Coumadin  tomorrow with goal INR of 2-3  Hypertension Home meds: None Stable Long-term BP goal normotensive  Hyperlipidemia Home meds: Atorvastatin  40 mg daily, resumed in hospital LDL 48, goal < 70 Continue statin at discharge  Tobacco Abuse Patient smokes 0.25 packs per day Advised patient to quit  Substance Abuse Patient uses marijuana UDS positive for [facility]  Will assess readiness to quit and advise cessation  Other Stroke Risk Factors Obesity, Body mass index is 31.79 kg/m., BMI >/= 30 associated with increased stroke risk, recommend weight loss, diet and exercise as appropriate  Migraines  Other Active Problems PCOS Anxiety, on Lexapro  Mild leukocytosis, WBC 12.2  Hospital day # 1  This patient is critically ill due to CRAO, antiphospholipid syndrome and at significant risk of neurological worsening, death form recurrent stroke, heart attack, bleeding from medication. This patient's care requires constant monitoring of vital signs, hemodynamics, respiratory and cardiac monitoring, review of multiple databases, neurological assessment, discussion with family, other specialists and medical decision making of high complexity. I spent 45 minutes of neurocritical care time in the care of this patient. I had long discussion with patient and her daughter at bedside, updated pt current  condition, treatment plan and potential prognosis, and answered all the questions. They expressed understanding and appreciation.   Ary Cummins, MD PhD Stroke Neurology 01/07/2024 7:06 PM  To contact Stroke Continuity provider, please refer to [email or web address]. After hours, contact General Neurology

## 2024-01-07 NOTE — Progress Notes (Signed)
 eLink Physician-Brief Progress Note Patient Name: Tammy L Nickelson DOB: 1981/01/24 MRN: [id number]   Date of Service  01/07/2024  HPI/Events of Note  Patient with focal neurological symptoms admitted as a Code Stroke and received TNK per Ischemic Stroke protocol.  eICU Interventions  New Patient Evaluation.        Kery Batzel U Morene Cecilio 01/07/2024, 5:47 AM

## 2024-01-07 NOTE — Progress Notes (Signed)
  Echocardiogram 2D Echocardiogram has been performed.  Sharlie Shreffler L Cruz Bong RDCS 01/07/2024, 9:08 AM

## 2024-01-08 ENCOUNTER — Other Ambulatory Visit (HOSPITAL_COMMUNITY): Payer: Self-pay

## 2024-01-08 LAB — CBC
HCT: 36.9 % (ref 36.0–46.0)
Hemoglobin: 11.9 g/dL — ABNORMAL LOW (ref 12.0–15.0)
MCH: 26.7 pg (ref 26.0–34.0)
MCHC: 32.2 g/dL (ref 30.0–36.0)
MCV: 82.9 fL (ref 80.0–100.0)
Platelets: 261 10*3/uL (ref 150–400)
RBC: 4.45 MIL/uL (ref 3.87–5.11)
RDW: 17.2 % — ABNORMAL HIGH (ref 11.5–15.5)
WBC: 9.3 10*3/uL (ref 4.0–10.5)
nRBC: 0 % (ref 0.0–0.2)

## 2024-01-08 LAB — BASIC METABOLIC PANEL
Anion gap: 9 (ref 5–15)
BUN: 9 mg/dL (ref 6–20)
CO2: 20 mmol/L — ABNORMAL LOW (ref 22–32)
Calcium: 8.5 mg/dL — ABNORMAL LOW (ref 8.9–10.3)
Chloride: 106 mmol/L (ref 98–111)
Creatinine, Ser: 0.7 mg/dL (ref 0.44–1.00)
GFR, Estimated: >60 mL/min (ref >60)
Glucose, Bld: 106 mg/dL — ABNORMAL HIGH (ref 70–99)
Potassium: 3.5 mmol/L (ref 3.5–5.1)
Sodium: 135 mmol/L (ref 135–145)

## 2024-01-08 LAB — PROTIME-INR
INR: 1 (ref 0.8–1.2)
Prothrombin Time: 13.5 s (ref 11.4–15.2)

## 2024-01-08 LAB — VITAMIN D 25 HYDROXY (VIT D DEFICIENCY, FRACTURES): Vit D, 25-Hydroxy: 33.02 ng/mL (ref 30–100)

## 2024-01-08 LAB — SEDIMENTATION RATE: Sed Rate: 20 mm/h (ref 0–22)

## 2024-01-08 MED ORDER — ENOXAPARIN SODIUM 100 MG/ML IJ SOSY
100.0000 mg | PREFILLED_SYRINGE | Freq: Two times a day (BID) | INTRAMUSCULAR | 0 refills | Status: DC
  Filled 2024-01-08: qty 14, 7d supply, fill #0

## 2024-01-08 MED ORDER — WARFARIN SODIUM 5 MG PO TABS: 5.0000 mg | ORAL_TABLET | Freq: Every day | ORAL | Status: DC

## 2024-01-08 MED ORDER — WARFARIN SODIUM 5 MG PO TABS
ORAL_TABLET | ORAL | 0 refills | Status: DC
  Filled 2024-01-08: qty 32, 30d supply, fill #0

## 2024-01-08 MED ORDER — POTASSIUM CHLORIDE CRYS ER 20 MEQ PO TBCR
40.0000 meq | EXTENDED_RELEASE_TABLET | Freq: Once | ORAL | Status: AC
Start: 2024-01-08 — End: 2024-01-08
  Administered 2024-01-08: 40 meq via ORAL
  Filled 2024-01-08: qty 2

## 2024-01-08 MED ORDER — WARFARIN SODIUM 7.5 MG PO TABS
7.5000 mg | ORAL_TABLET | Freq: Every day | ORAL | Status: DC
  Administered 2024-01-08: 7.5 mg via ORAL
  Filled 2024-01-08: qty 1

## 2024-01-08 NOTE — TOC CM/SW Note (Signed)
 MATCH Medication Assistance Card *Pharmacies please call [phone] for claim processing assistance Name:  [name] Costa                                                                                                                                                                                    Relationship Code:  1 ID (MRN): [id number]                                                                                                                                                                                  Person Code:  01 Bin: [id number] RX Group: [id number] Discharge Date: 01/08/2024                                 RX PCN:  [id number] Expiration Date:01/15/2014                                           (must be filled within 7 days of discharge)     You have been approved to have the prescriptions written by your discharging physician filled through our [facility] (Medication Assistance Through [facility]) program. This program allows for a one-time (no refills) 34-day supply of selected medications for a low copay amount.  The copay is $0 per prescription.   Only certain pharmacies are participating in this program with [facility]. You will need to select one of the pharmacies from the attached list and take your prescriptions, this letter, and your photo ID to one of  the [facility] Outpatient pharmacies.  We are excited that you are able to use the [facility] program to get your medications. These prescriptions must be filled within  7 days of hospital discharge or they will no longer be valid for the [facility] program. Should you have any problems with your prescriptions please contact your case management team member at [phone] for [place]/ [facility].  Thank you,   [facility] Health Care Management  Mliss MICAEL Fass, RN, BSN  Trauma/Neuro ICU Case Manager [phone]

## 2024-01-08 NOTE — Discharge Summary (Addendum)
 Stroke Discharge Summary  Patient ID: [name] Costa   MRN: [id number]      DOB: 17-Jan-1981  Date of Admission: 01/06/2024 Date of Discharge: 01/08/2024  Attending Physician:  Jerri Pfeiffer MD Consultant(s):    None  Patient's PCP:  [facility]  DISCHARGE PRIMARY DIAGNOSIS:  Right CRAO status post TNK, etiology: embolic in the setting of antiphospholipid syndrome    Secondary diagnosis History of stroke Antiphospholipid syndrome Hypertension Hyperlipidemia Smoker Marijuana use Obesity PCOS    Allergies as of 01/08/2024       Reactions   Latex Rash        Medication List     STOP taking these medications    ibuprofen  800 MG tablet Commonly known as: ADVIL        TAKE these medications    amLODipine  5 MG tablet Commonly known as: NORVASC  Take 1 tablet (5 mg total) by mouth daily.   aspirin  81 MG chewable tablet Chew 81 mg by mouth daily.   atorvastatin  40 MG tablet Commonly known as: LIPITOR Take 40 mg by mouth daily.   enoxaparin  100 MG/ML injection Commonly known as: LOVENOX  Inject 1 mL (100 mg total) into the skin every 12 (twelve) hours for 7 days.   escitalopram  10 MG tablet Commonly known as: LEXAPRO  Take 10 mg by mouth daily.   MULTIVITAMIN PO Take 1 tablet by mouth daily. With iron   Super Calcium  1500 (600 Ca) MG Tabs tablet Generic drug: calcium  carbonate Take 1,500 mg by mouth daily.   VITAMIN D  PO Take 1 tablet by mouth daily.   warfarin 5 MG tablet Commonly known as: COUMADIN  Take 1.5 tablets (7.5 mg total) by mouth daily at 4 PM for 3 days, THEN 1 tablet (5 mg total) daily at 4 PM for 27 days. Start taking on: January 08, 2024        LABORATORY STUDIES CBC    Component Value Date/Time   WBC 9.3 01/08/2024 0409   RBC 4.45 01/08/2024 0409   HGB 11.9 (L) 01/08/2024 0409   HCT 36.9 01/08/2024 0409   PLT 261 01/08/2024 0409   MCV 82.9 01/08/2024 0409   MCH 26.7 01/08/2024 0409   MCHC 32.2  01/08/2024 0409   RDW 17.2 (H) 01/08/2024 0409   LYMPHSABS 2.6 01/06/2024 1943   MONOABS 0.7 01/06/2024 1943   EOSABS 0.1 01/06/2024 1943   BASOSABS 0.0 01/06/2024 1943   CMP    Component Value Date/Time   NA 135 01/08/2024 0409   K 3.5 01/08/2024 0409   CL 106 01/08/2024 0409   CO2 20 (L) 01/08/2024 0409   GLUCOSE 106 (H) 01/08/2024 0409   BUN 9 01/08/2024 0409   CREATININE 0.70 01/08/2024 0409   CALCIUM  8.5 (L) 01/08/2024 0409   PROT 7.3 01/06/2024 1943   ALBUMIN 3.7 01/06/2024 1943   AST 17 01/06/2024 1943   ALT 14 01/06/2024 1943   ALKPHOS 78 01/06/2024 1943   BILITOT 0.6 01/06/2024 1943   GFRNONAA >60 01/08/2024 0409   COAGS Lab Results  Component Value Date   INR 1.0 01/08/2024   INR 1.0 01/06/2024   INR 1.0 06/04/2021   Lipid Panel    Component Value Date/Time   CHOL 102 01/07/2024 0534   TRIG 66 01/07/2024 0534   HDL 41 01/07/2024 0534   CHOLHDL 2.5 01/07/2024 0534   VLDL 13 01/07/2024 0534   LDLCALC 48 01/07/2024 0534   HgbA1C  Lab Results  Component  Value Date   HGBA1C 5.6 01/07/2024   Alcohol Level    Component Value Date/Time   ETH <10 01/06/2024 1943     SIGNIFICANT DIAGNOSTIC STUDIES MR BRAIN WO CONTRAST Result Date: 01/07/2024 CLINICAL DATA:  Sudden vision loss in the right eye, history of right basal ganglia infarct in 2022 EXAM: MRI HEAD WITHOUT CONTRAST TECHNIQUE: Multiplanar, multiecho pulse sequences of the brain and surrounding structures were obtained without intravenous contrast. COMPARISON:  06/04/2021 MRI head, correlation is made with 01/06/2024 CT head FINDINGS: Brain: No restricted diffusion to suggest acute or subacute infarct. No acute hemorrhage, mass, mass effect, or midline shift. No hydrocephalus or extra-axial collection. Pituitary and craniocervical junction within normal limits. Remote right posterior lentiform nucleus infarct. Vascular: Normal arterial flow voids. Skull and upper cervical spine: Redemonstrated expansile  right parietal bone lesion, with CT features suggestive of fibrous dysplasia. Otherwise normal marrow signal. Sinuses/Orbits: Mucosal thickening in the maxillary sinuses with right maxillary air-fluid level. No acute finding in the orbits. Other: The mastoid air cells are well aerated. IMPRESSION: 1. No acute intracranial process. No evidence of acute or subacute infarct. 2. Right maxillary air-fluid level, which can be seen in the setting of acute sinusitis. Correlate with symptoms. Electronically Signed   By: Donald Campion M.D.   On: 01/07/2024 19:28   ECHOCARDIOGRAM COMPLETE Result Date: 01/07/2024    ECHOCARDIOGRAM REPORT   Patient Name:   [name] Costa Date of Exam: 01/07/2024 Medical Rec #:  [id number]        Height:       71.0 in Accession #:    [id number]       Weight:       228.0 lb Date of Birth:  [birth year]        BSA:          2.229 m Patient Age:    42 years         BP:           147/78 mmHg Patient Gender: F                HR:           60 bpm. Exam Location:  Inpatient Procedure: 2D Echo, Cardiac Doppler and Color Doppler Indications:    Stroke  History:        Patient has prior history of Echocardiogram examinations, most                 recent 06/04/2021. Risk Factors:Hypertension.  Sonographer:    Juanita Shaw Referring Phys: [id number] ASHISH ARORA IMPRESSIONS  1. Left ventricular ejection fraction, by estimation, is 60 to 65%. The left ventricle has normal function. The left ventricle has no regional wall motion abnormalities. Left ventricular diastolic parameters were normal.  2. Right ventricular systolic function is normal. The right ventricular size is normal.  3. The mitral valve is normal in structure. No evidence of mitral valve regurgitation. No evidence of mitral stenosis.  4. The aortic valve is normal in structure. Aortic valve regurgitation is not visualized. No aortic stenosis is present.  5. The inferior vena cava is normal in size with greater than 50% respiratory variability,  suggesting right atrial pressure of 3 mmHg. FINDINGS  Left Ventricle: Left ventricular ejection fraction, by estimation, is 60 to 65%. The left ventricle has normal function. The left ventricle has no regional wall motion abnormalities. The left ventricular internal cavity size was normal in size. There is  no left ventricular hypertrophy. Left  ventricular diastolic parameters were normal. Right Ventricle: The right ventricular size is normal. No increase in right ventricular wall thickness. Right ventricular systolic function is normal. Left Atrium: Left atrial size was normal in size. Right Atrium: Right atrial size was normal in size. Pericardium: There is no evidence of pericardial effusion. Mitral Valve: The mitral valve is normal in structure. No evidence of mitral valve regurgitation. No evidence of mitral valve stenosis. MV peak gradient, 2.8 mmHg. The mean mitral valve gradient is 1.0 mmHg. Tricuspid Valve: The tricuspid valve is normal in structure. Tricuspid valve regurgitation is not demonstrated. No evidence of tricuspid stenosis. Aortic Valve: The aortic valve is normal in structure. Aortic valve regurgitation is not visualized. No aortic stenosis is present. Aortic valve mean gradient measures 5.0 mmHg. Aortic valve peak gradient measures 9.7 mmHg. Aortic valve area, by VTI measures 1.51 cm. Pulmonic Valve: The pulmonic valve was normal in structure. Pulmonic valve regurgitation is not visualized. No evidence of pulmonic stenosis. Aorta: The aortic root is normal in size and structure. Venous: The inferior vena cava is normal in size with greater than 50% respiratory variability, suggesting right atrial pressure of 3 mmHg. IAS/Shunts: No atrial level shunt detected by color flow Doppler.  LEFT VENTRICLE PLAX 2D LVIDd:         4.90 cm      Diastology LVIDs:         2.70 cm      LV e' medial:    8.05 cm/s LV PW:         0.80 cm      LV E/e' medial:  10.3 LV IVS:        0.80 cm      LV e' lateral:    14.40 cm/s LVOT diam:     1.80 cm      LV E/e' lateral: 5.8 LV SV:         51 LV SV Index:   23 LVOT Area:     2.54 cm  LV Volumes (MOD) LV vol d, MOD A2C: 127.0 ml LV vol d, MOD A4C: 108.0 ml LV vol s, MOD A2C: 40.4 ml LV vol s, MOD A4C: 38.5 ml LV SV MOD A2C:     86.6 ml LV SV MOD A4C:     108.0 ml LV SV MOD BP:      83.9 ml RIGHT VENTRICLE             IVC RV Basal diam:  4.10 cm     IVC diam: 2.10 cm RV Mid diam:    2.60 cm RV S prime:     12.70 cm/s TAPSE (M-mode): 2.5 cm LEFT ATRIUM             Index        RIGHT ATRIUM           Index LA diam:        3.60 cm 1.61 cm/m   RA Area:     15.70 cm LA Vol (A2C):   27.8 ml 12.47 ml/m  RA Volume:   44.00 ml  19.74 ml/m LA Vol (A4C):   23.7 ml 10.63 ml/m LA Biplane Vol: 26.8 ml 12.02 ml/m  AORTIC VALVE                     PULMONIC VALVE AV Area (Vmax):    1.59 cm      PV Vmax:       0.80 m/s AV Area (  Vmean):   1.56 cm      PV Peak grad:  2.5 mmHg AV Area (VTI):     1.51 cm AV Vmax:           156.00 cm/s AV Vmean:          105.000 cm/s AV VTI:            0.337 m AV Peak Grad:      9.7 mmHg AV Mean Grad:      5.0 mmHg LVOT Vmax:         97.50 cm/s LVOT Vmean:        64.200 cm/s LVOT VTI:          0.200 m LVOT/AV VTI ratio: 0.59  AORTA Ao Root diam: 2.50 cm Ao Asc diam:  2.80 cm MITRAL VALVE MV Area (PHT): 2.44 cm    SHUNTS MV Area VTI:   1.83 cm    Systemic VTI:  0.20 m MV Peak grad:  2.8 mmHg    Systemic Diam: 1.80 cm MV Mean grad:  1.0 mmHg MV Vmax:       0.83 m/s MV Vmean:      46.0 cm/s MV E velocity: 82.90 cm/s MV A velocity: 61.60 cm/s MV E/A ratio:  1.35 Kardie Tobb DO Electronically signed by Dub Huntsman DO Signature Date/Time: 01/07/2024/12:33:44 PM    Final    CT ANGIO HEAD NECK W WO CM (CODE STROKE) Result Date: 01/06/2024 CLINICAL DATA:  Neuro deficit, acute, stroke suspected. Central retinal artery occlusion suspected on the right. Loss of vision. EXAM: CT ANGIOGRAPHY HEAD AND NECK WITH AND WITHOUT CONTRAST TECHNIQUE: Multidetector CT imaging of  the head and neck was performed using the standard protocol during bolus administration of intravenous contrast. Multiplanar CT image reconstructions and MIPs were obtained to evaluate the vascular anatomy. Carotid stenosis measurements (when applicable) are obtained utilizing NASCET criteria, using the distal internal carotid diameter as the denominator. RADIATION DOSE REDUCTION: This exam was performed according to the departmental dose-optimization program which includes automated exposure control, adjustment of the mA and/or kV according to patient size and/or use of iterative reconstruction technique. CONTRAST:  75mL OMNIPAQUE  IOHEXOL  350 MG/ML SOLN COMPARISON:  Head CT same day prior CT angiography 01/08/2023 FINDINGS: CTA NECK FINDINGS Aortic arch: Aortic arch is normal.  Branching pattern is normal. Right carotid system: Common carotid artery widely patent to the bifurcation. Normal bifurcation. Normal cervical ICA. Left carotid system: Left carotid system similarly normal. Vertebral arteries: Proximal vertebral artery detail is limited because of poor contrast opacification and regional dense venous reflux. Beyond proximal extent, the vertebral arteries are patent through the cervical region to the foramen magnum. Skeleton: No significant bone abnormality. Other neck: No mass or lymphadenopathy. Upper chest: Early emphysematous change. No focal finding otherwise. Review of the MIP images confirms the above findings CTA HEAD FINDINGS Anterior circulation: Both internal carotid arteries are patent through the skull base and siphon regions. No siphon stenosis. Minimal siphon atherosclerotic calcification. The anterior and middle cerebral vessels are patent. No large vessel occlusion or proximal stenosis. No aneurysm or vascular malformation. Posterior circulation: Both vertebral arteries are patent through the foramen magnum to the basilar artery. No basilar stenosis. Posterior circulation branch vessels are  normal. Venous sinuses: Patent and normal. Anatomic variants: Azygous anterior cerebral artery. Review of the MIP images confirms the above findings IMPRESSION: 1. No intracranial large vessel occlusion or proximal stenosis. 2. Normal carotid bifurcations. 3. Proximal vertebral artery detail is limited because  of poor arterial contrast opacification and regional dense venous reflux. Beyond proximal extent, the vertebral arteries are patent through the cervical region to the foramen magnum. 4. Early emphysematous change in the upper lungs. Electronically Signed   By: Oneil Officer M.D.   On: 01/06/2024 20:18   CT HEAD CODE STROKE WO CONTRAST Result Date: 01/06/2024 CLINICAL DATA:  Code stroke. Initial evaluation for acute neuro deficit, stroke suspected. EXAM: CT HEAD WITHOUT CONTRAST TECHNIQUE: Contiguous axial images were obtained from the base of the skull through the vertex without intravenous contrast. RADIATION DOSE REDUCTION: This exam was performed according to the departmental dose-optimization program which includes automated exposure control, adjustment of the mA and/or kV according to patient size and/or use of iterative reconstruction technique. COMPARISON:  Prior study from 01/08/2023 FINDINGS: Brain: Cerebral volume within normal limits. Chronic right basal ganglia lacunar infarct. No acute intracranial hemorrhage. No acute cortically based infarct. No mass lesion, midline shift or mass effect. No hydrocephalus or extra-axial fluid collection. Vascular: No abnormal hyperdense vessel. Skull: Scalp soft tissues demonstrate no acute finding. 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 Calvarium otherwise intact. Sinuses/Orbits: Globes and orbital soft tissues within normal limits. Changes of chronic maxillary sinusitis, right greater than left. No mastoid effusion. Other: None. ASPECTS [facility] Stroke Program Early CT Score) - Ganglionic level  infarction (caudate, lentiform nuclei, internal capsule, insula, M1-M3 cortex): 7 - Supraganglionic infarction (M4-M6 cortex): 3 Total score (0-10 with 10 being normal): 10 IMPRESSION: 1. No acute intracranial abnormality. 2. ASPECTS is 10. 3. Chronic right basal ganglia lacunar infarct. These results were communicated to Dr. Arora at 8:00 pm on 01/06/2024 by text page via the [facility] messaging system. Electronically Signed   By: Morene Hoard M.D.   On: 01/06/2024 20:05       HISTORY OF PRESENT ILLNESS 43 y.o. patient with history of right basal ganglia infarct in 2022, positive ANA and anticardiolipin antibodies followed by rheumatology admitted for sudden onset vision loss in the right eye.  Patient stated that her vision suddenly became blurred in the right eye, and she experienced a headache different from her usual migraine after the vision loss occurred.    She was administered TNK for possible CRAO.She is being followed by rheumatology and was noted to have had elevated anticardiolipin IgM and beta-2  glycoprotein antibodies elevated as well.  This leads to a diagnosis of antiphospholipid syndrome.  Discussed with patient's outpatient rheumatologist, and will start patient on Coumadin  with goal INR of 2-3.  NIH on Admission 0   HOSPITAL COURSE Right CRAO status post TNK, etiology: embolic in the setting of antiphospholipid syndrome Code Stroke CT head No acute abnormality.  Chronic right basal ganglia infarct ASPECTS 10.    CTA head & neck no LVO or hemodynamically significant stenosis MRI no acute infarct 2D Echo EF 60 to 65%, normal left atrial size, no atrial level shunt LDL 48 HgbA1c 5.6 UDS positive for THC VTE prophylaxis -SCDs aspirin  81 mg daily prior to admission, now on aspirin  81 and Lovenox  bridge to Coumadin  with goal INR 2-3. INR 2/7 - 1.0 Therapy recommendations: None Disposition: Likely home tomorrow   Hx of Stroke/TIA 05/2021 patient had right basal ganglia stroke  status post tPA.  EF 65 to 70%.  LDL 105, A1c 5.4.?  PFO on TEE but TCD bubble study negative for PFO.  Cardiolipin antibody IgM 108.  Discharged on DAPT 08/2021 follow-up with Dr. Rosemarie at [facility], repeat cardiolipin  IgM 54   Antiphospholipid syndrome Patient has 3 children, 2 abortion and 1 miscarriage 08/2023 seen by Dr. Dolphus at rheumatology, cardiolipin IgM >112, beta-2  glycoprotein IgM >112, ANA positive 1:80 Discussed with patient's outpatient rheumatologist, will follow-up as outpatient to start Plaquenil .   Pending C3, C4, ESR, repeat ANA, cardiolipin and beta-2  glycoprotein antibody Now on aspirin  81 and Lovenox  bridge to Coumadin  with goal INR of 2-3   Hypertension Home meds: None Stable Long-term BP goal normotensive   Hyperlipidemia Home meds: Atorvastatin  40 mg daily, resumed in hospital LDL 48, goal < 70 Continue statin at discharge   Tobacco Abuse Patient smokes 0.25 packs per day Advised patient to quit   Substance Abuse Patient uses marijuana UDS positive for [facility]  Will assess readiness to quit and advise cessation   Other Stroke Risk Factors Obesity, Body mass index is 31.79 kg/m., BMI >/= 30 associated with increased stroke risk, recommend weight loss, diet and exercise as appropriate  Migraines   Other Active Problems PCOS Anxiety, on Lexapro  Mild leukocytosis, WBC 12.2   DISCHARGE EXAM  PHYSICAL EXAM General:  Alert, well-nourished, well-developed patient in no acute distress Psych:  Mood and affect appropriate for situation CV: Regular rate and rhythm on monitor Respiratory:  Regular, unlabored respirations on room air   NEURO:  Mental Status: AA&Ox3, patient is able to give clear and coherent history Speech/Language: speech is without dysarthria or aphasia.  Naming, repetition, fluency, and comprehension intact.   Cranial Nerves:  II: PERRL. Visual fields full in bilateral eyes, tested together and separately.  III, IV, VI: EOMI. Eyelids  elevate symmetrically.  V: Sensation is intact to light touch and slightly diminished in left V1, this is baseline from previous stroke VII: Face is symmetrical resting and smiling VIII: hearing intact to voice. IX, X:  Phonation is normal.  KP:Dynloizm shrug 5/5. XII: tongue is midline without fasciculations. Motor: 5/5 strength to all muscle groups tested.  Tone: is normal and bulk is normal Sensation- Intact to light touch bilaterally. Coordination: FTN intact bilaterally Gait- deferred   1a Level of Conscious.: 0 1b LOC Questions: 0 1c LOC Commands: 0 2 Best Gaze: 0 3 Visual: 0 4 Facial Palsy: 0 5a Motor Arm - left: 0 5b Motor Arm - Right: 0 6a Motor Leg - Left: 0 6b Motor Leg - Right: 0 7 Limb Ataxia: 0 8 Sensory: 0 9 Best Language: 0 10 Dysarthria: 0 11 Extinct. and Inatten.: 0 TOTAL: 0   Discharge Diet       Diet   Diet Heart Room service appropriate? Yes with Assist; Fluid consistency: Thin   liquids  DISCHARGE PLAN Disposition: Home warfarin daily for secondary stroke prevention Ongoing stroke risk factor control by Primary Care Physician at time of discharge Follow-up PCP [facility] next week Follow up with Rheumatology Follow up with Hematology on Tuesday at 2pm Follow-up in [facility] in 4 weeks, office to schedule an appointment.   50 minutes were spent preparing discharge.  Patient seen and examined by NP/APP with MD. MD to update note as needed.   Jorene Last, DNP, FNP-BC [facility] Pager: [phone]  ATTENDING NOTE: I reviewed above note and agree with the assessment and plan. Pt was seen and examined.   Daughter at bedside.  Patient reclining in bed, neuro intact, vision back to baseline.  Continue aspirin  81 and Lovenox  bridging Coumadin  with INR 2-3.  Patient has no insurance, will self-pay for  the Lovenox  injection.  Scheduled follow-up with hematology next  Tuesday for INR check.  Educated on medication compliance.  Patient will follow-up with outpatient rheumatology, hematology, neurology and PCP.  Smoking cessation education provided.  Patient discharged in stable condition.  For detailed assessment and plan, please refer to above/below as I have made changes wherever appropriate.   Ary Cummins, MD PhD Stroke Neurology 01/08/2024 10:31 PM

## 2024-01-08 NOTE — Discharge Instructions (Signed)

## 2024-01-08 NOTE — TOC CAGE-AID Note (Signed)
 Transition of Care [facility]) - CAGE-AID Screening   Patient Details  Name: [name] MRN: [id number] Date of Birth: Jun 02, 1981  [facility] CM/SW Contact:    Briannah Lona M, RN Phone Number: 01/08/2024, 1:53 PM   Clinical Narrative: Patient admitted with RT CRAO s/p TNK; embolic in the setting of antiphospholipid syndrome.  Patient admits to daily marijuana use, but states it is not a problem for her.  She was offered SA resources, but declined.    CAGE-AID Screening:    Have You Ever Felt You Ought to Cut Down on Your Drinking or Drug Use?: No Have People Annoyed You By Critizing Your Drinking Or Drug Use?: No Have You Felt Bad Or Guilty About Your Drinking Or Drug Use?: No Have You Ever Had a Drink or Used Drugs First Thing In The Morning to Steady Your Nerves or to Get Rid of a Hangover?: No CAGE-AID Score: 0  Substance Abuse Education Offered: Yes  Substance abuse interventions: Patient Counseling  Mliss MICAEL Fass, RN, BSN  Trauma/Neuro ICU Case Manager [phone]

## 2024-01-08 NOTE — TOC Transition Note (Signed)
 Transition of Care [facility]) - Discharge Note   Patient Details  Name: [name] Costa MRN: [id number] Date of Birth: 06-09-1981  Transition of Care [facility]) CM/SW Contact:  Genee Rann M, RN Phone Number: 01/08/2024, 12:00pm  Clinical Narrative:    Patient medically stable for discharge home today.  Daughter at bedside will provide transportation home.  She is independent with all activity and ADLs.  Patient discharging home on Lovenox  and requires follow up labwork Monday or Tuesday for INR check.  Attempted to get patient established with Coumadin  clinic, but unfortunately they will not accept her without being an established cardiology patient with one of their providers.  Patient has PCP, [name], FNP at [facility]; she states she prefers to follow up with her, if possible.  Called [facility], and the practice is closed today.   After much discussion with attending MD, NP, and pharmacy, decision has been made for patient to follow up with her PCP first thing on Monday to arrange appointment for visit/labwork. Patient feels comfortable with this plan, and states she will call PCP for follow up.    Addendum: 3:15pm Notified by NP [name] that Hem/Onc has agreed to see patient on 01/12/2024 at 2pm for appt with [employer], PAC and bloodwork.  Appt information is on AVS.    Final next level of care: Home/Self Care Barriers to Discharge: Barriers Resolved            Discharge Plan and Services Additional resources added to the After Visit Summary for  NA   Discharge Planning Services: CM Consult                                 Social Drivers of Health (SDOH) Interventions SDOH Screenings   Food Insecurity: No Food Insecurity (01/07/2024)  Housing: Low Risk  (01/07/2024)  Transportation Needs: No Transportation Needs (01/07/2024)  Utilities: Not At Risk (01/07/2024)  Depression (PHQ2-9): Low Risk  (05/28/2023)  Tobacco Use: High Risk  (01/06/2024)     Readmission Risk Interventions     No data to display         Mliss MICAEL Fass, RN, BSN  Trauma/Neuro ICU Case Manager [phone]

## 2024-01-08 NOTE — Progress Notes (Signed)
 SLP Cancellation Note  Patient Details Name: [name] Costa MRN: [id number] DOB: Aug 29, 1981   Cancelled treatment:       Reason Eval/Treat Not Completed: SLP screened. Pt states she returned to baseline following previous CVA 2022 and has no acute concerns. No needs identified, will sign off.   Damien Blumenthal, M.A., CF-SLP Speech Language Pathology, Acute Rehabilitation Services  Secure Chat preferred [phone]  01/08/2024, 12:03 PM

## 2024-01-10 LAB — CARDIOLIPIN ANTIBODIES, IGG, IGM, IGA
Anticardiolipin IgA: <9 [APL'U]/mL (ref 0–11)
Anticardiolipin IgG: <9 [GPL'U]/mL (ref 0–14)
Anticardiolipin IgM: 40 [MPL'U]/mL — ABNORMAL HIGH (ref 0–12)

## 2024-01-10 LAB — C4 COMPLEMENT: Complement C4, Body Fluid: 19 mg/dL (ref 12–38)

## 2024-01-10 LAB — ANTI-DNA ANTIBODY, DOUBLE-STRANDED: ds DNA Ab: <1 [IU]/mL (ref 0–9)

## 2024-01-10 LAB — ANA W/REFLEX IF POSITIVE: Anti Nuclear Antibody (ANA): NEGATIVE

## 2024-01-10 LAB — C3 COMPLEMENT: C3 Complement: 149 mg/dL (ref 82–167)

## 2024-01-11 ENCOUNTER — Telehealth: Payer: Self-pay | Admitting: Rheumatology

## 2024-01-11 LAB — BETA-2-GLYCOPROTEIN I ABS, IGG/M/A
Beta-2 Glyco I IgG: <9 GPI IgG units (ref 0–20)
Beta-2-Glycoprotein I IgA: <9 GPI IgA units (ref 0–25)
Beta-2-Glycoprotein I IgM: >150 GPI IgM units — ABNORMAL HIGH (ref 0–32)

## 2024-01-11 NOTE — Telephone Encounter (Signed)
-----   Message from Nurse Larraine Plush sent at 01/07/2024  2:41 PM EST ----- Please schedule patient for a follow up visit in about 1 month. In the morning when Devki is here with Dr. Alvira Josephs or Carolynne Citron to discuss treatment options. Patient is currently in the hospital.

## 2024-01-11 NOTE — Telephone Encounter (Signed)
 LMOM for patient to call and schedule follow-up appointment to discuss treatment options.  Schedule with Dr. Alvira Josephs or Carolynne Citron when Devki is in the office.

## 2024-01-11 NOTE — Progress Notes (Signed)
[facility] [phone]   [phone]  INITIAL CONSULT NOTE  Patient Care Team: [facility] as PCP - General  Hematological/Oncological History 08/2023: Labs from rheumatology showed cardiolipin IgM >112, beta-2 glycoprotein IgM >112   01/06/2024-01/08/2024: Admitted for sudden onset vision loss in right eye. She was administered TNK for possible CRAO. Repeat cardiolipin IgM 40. Beta-2 glycoprotein IgM levels pending. Initiated therapy with aspirin 81 mg and Lovenox bridge to Coumadin with goal INR 2-3.   01/12/2024: Establish care with [facility]  CHIEF COMPLAINTS/PURPOSE OF CONSULTATION:  Central retinal artery occlusion of right eye.  Antiphospholipid syndrome.   HISTORY OF PRESENTING ILLNESS:  [name] Costa 43 y.o. female with medical history significant for ***  On review of the previous records ***  On exam today ***  MEDICAL HISTORY:  Past Medical History:  Diagnosis Date   Bladder infection    Chest wall pain    PCOS (polycystic ovarian syndrome)    Stroke (HCC) 06/04/2021   Trichomonas     SURGICAL HISTORY: Past Surgical History:  Procedure Laterality Date   BREAST BIOPSY Right 05/25/2023   [place] RT BREAST BX W LOC DEV 1ST LESION IMG BX SPEC US GUIDE 05/25/2023 [facility] MAMMOGRAPHY   BUBBLE STUDY  06/06/2021   Procedure: BUBBLE STUDY;  Surgeon: [name], MD;  Location: [facility] ENDOSCOPY;  Service: Cardiovascular;;   CHOLECYSTECTOMY     CHOLECYSTECTOMY     COLONOSCOPY  2006   TEE WITHOUT CARDIOVERSION N/A 06/06/2021   Procedure: TRANSESOPHAGEAL ECHOCARDIOGRAM (TEE);  Surgeon: [name], MD;  Location: [facility] ENDOSCOPY;  Service: Cardiovascular;  Laterality: N/A;   TUBAL LIGATION Bilateral 04/13/2013   Procedure: POST PARTUM TUBAL LIGATION;  Surgeon: Bing Plume, MD;  Location: [facility] ORS;  Service: Gynecology;  Laterality: Bilateral;    SOCIAL HISTORY: Social History    Socioeconomic History   Marital status: Divorced    Spouse name: Not on file   Number of children: Not on file   Years of education: Not on file   Highest education level: Not on file  Occupational History   Not on file  Tobacco Use   Smoking status: Every Day    Types: Cigarettes    Passive exposure: Never   Smokeless tobacco: Never   Tobacco comments:    5-6 cigarettes daily. Smoked Off and on since a teenager  Vaping Use   Vaping status: Former  Substance and Sexual Activity   Alcohol use: Not Currently   Drug use: Yes    Types: Marijuana    Comment: occ   Sexual activity: Not on file  Other Topics Concern   Not on file  Social History Narrative   Not on file   Social Drivers of Health   Financial Resource Strain: Not on file  Food Insecurity: No Food Insecurity (01/07/2024)   Hunger Vital Sign    Worried About Running Out of Food in the Last Year: Never true    Ran Out of Food in the Last Year: Never true  Transportation Needs: No Transportation Needs (01/07/2024)   PRAPARE - Administrator, Civil Service (Medical): No    Lack of Transportation (Non-Medical): No  Physical Activity: Not on file  Stress: Not on file  Social Connections: Not on file  Intimate Partner Violence: Not At Risk (01/07/2024)   Humiliation, Afraid, Rape, and Kick questionnaire    Fear of Current or Ex-Partner: No    Emotionally Abused:  No    Physically Abused: No    Sexually Abused: No    FAMILY HISTORY: Family History  Problem Relation Age of Onset   Hypertension Mother    Hypertension Father    Hypertension Sister    Hypertension Brother    Breast cancer Maternal Grandmother    Healthy Daughter    Healthy Son    Autism Son     ALLERGIES:  is allergic to latex.  MEDICATIONS:  Current Outpatient Medications  Medication Sig Dispense Refill   amLODipine (NORVASC) 5 MG tablet Take 1 tablet (5 mg total) by mouth daily. 30 tablet 11   aspirin 81 MG chewable tablet  Chew 81 mg by mouth daily.     atorvastatin (LIPITOR) 40 MG tablet Take 40 mg by mouth daily.     calcium carbonate (SUPER CALCIUM) 1500 (600 Ca) MG TABS tablet Take 1,500 mg by mouth daily.     enoxaparin (LOVENOX) 100 MG/ML injection Inject 1 mL (100 mg total) into the skin every 12 (twelve) hours for 7 days. 14 mL 0   escitalopram (LEXAPRO) 10 MG tablet Take 10 mg by mouth daily.     Multiple Vitamin (MULTIVITAMIN PO) Take 1 tablet by mouth daily. With iron     VITAMIN D PO Take 1 tablet by mouth daily.     warfarin (COUMADIN) 5 MG tablet Take 1.5 tablets (7.5 mg total) by mouth daily at 4 PM for 3 days, THEN 1 tablet (5 mg total) daily at 4 PM for 27 days. 32 tablet 0   No current facility-administered medications for this visit.    REVIEW OF SYSTEMS:   Constitutional: ( - ) fevers, ( - )  chills , ( - ) night sweats Eyes: ( - ) blurriness of vision, ( - ) double vision, ( - ) watery eyes Ears, nose, mouth, throat, and face: ( - ) mucositis, ( - ) sore throat Respiratory: ( - ) cough, ( - ) dyspnea, ( - ) wheezes Cardiovascular: ( - ) palpitation, ( - ) chest discomfort, ( - ) lower extremity swelling Gastrointestinal:  ( - ) nausea, ( - ) heartburn, ( - ) change in bowel habits Skin: ( - ) abnormal skin rashes Lymphatics: ( - ) new lymphadenopathy, ( - ) easy bruising Neurological: ( - ) numbness, ( - ) tingling, ( - ) new weaknesses Behavioral/Psych: ( - ) mood change, ( - ) new changes  All other systems were reviewed with the patient and are negative.  PHYSICAL EXAMINATION: ECOG PERFORMANCE STATUS: {CHL ONC ECOG [phone]}  There were no vitals filed for this visit. There were no vitals filed for this visit.  GENERAL: well appearing *** in NAD  SKIN: skin color, texture, turgor are normal, no rashes or significant lesions EYES: conjunctiva are pink and non-injected, sclera clear OROPHARYNX: no exudate, no erythema; lips, buccal mucosa, and tongue normal  NECK: supple,  non-tender LYMPH:  no palpable lymphadenopathy in the cervical, axillary or supraclavicular lymph nodes.  LUNGS: clear to auscultation and percussion with normal breathing effort HEART: regular rate & rhythm and no murmurs and no lower extremity edema ABDOMEN: soft, non-tender, non-distended, normal bowel sounds Musculoskeletal: no cyanosis of digits and no clubbing  PSYCH: alert & oriented x 3, fluent speech NEURO: no focal motor/sensory deficits  LABORATORY DATA:  I have reviewed the data as listed    Latest Ref Rng & Units 01/08/2024    4:09 AM 01/06/2024    7:43 PM  01/06/2024    7:40 PM  CBC  WBC 4.0 - 10.5 K/uL 9.3  12.2    Hemoglobin 12.0 - 15.0 g/dL 40.9  81.1  91.4   Hematocrit 36.0 - 46.0 % 36.9  39.2  38.0   Platelets 150 - 400 K/uL 261  278         Latest Ref Rng & Units 01/08/2024    4:09 AM 01/06/2024    7:43 PM 01/06/2024    7:40 PM  CMP  Glucose 70 - 99 mg/dL 782  85  86   BUN 6 - 20 mg/dL 9  7  7    Creatinine 0.44 - 1.00 mg/dL 9.56  2.13  0.86   Sodium 135 - 145 mmol/L 135  138  138   Potassium 3.5 - 5.1 mmol/L 3.5  3.6  3.6   Chloride 98 - 111 mmol/L 106  105  106   CO2 22 - 32 mmol/L 20  22    Calcium 8.9 - 10.3 mg/dL 8.5  9.3    Total Protein 6.5 - 8.1 g/dL  7.3    Total Bilirubin 0.0 - 1.2 mg/dL  0.6    Alkaline Phos 38 - 126 U/L  78    AST 15 - 41 U/L  17    ALT 0 - 44 U/L  14       PATHOLOGY: ***  BLOOD FILM: *** Review of the peripheral blood smear showed normal appearing white cells with neutrophils that were appropriately lobated and granulated. There was no predominance of bi-lobed or hyper-segmented neutrophils appreciated. No Dohle bodies were noted. There was no left shifting, immature forms or blasts noted. Lymphocytes remain normal in size without any predominance of large granular lymphocytes. Red cells show no anisopoikilocytosis, macrocytes , microcytes or polychromasia. There were no schistocytes, target cells, echinocytes, acanthocytes,  dacrocytes, or stomatocytes.There was no rouleaux formation, nucleated red cells, or intra-cellular inclusions noted. The platelets are normal in size, shape, and color without any clumping evident.  RADIOGRAPHIC STUDIES: I have personally reviewed the radiological images as listed and agreed with the findings in the report. MR BRAIN WO CONTRAST Result Date: 01/07/2024 CLINICAL DATA:  Sudden vision loss in the right eye, history of right basal ganglia infarct in 2022 EXAM: MRI HEAD WITHOUT CONTRAST TECHNIQUE: Multiplanar, multiecho pulse sequences of the brain and surrounding structures were obtained without intravenous contrast. COMPARISON:  06/04/2021 MRI head, correlation is made with 01/06/2024 CT head FINDINGS: Brain: No restricted diffusion to suggest acute or subacute infarct. No acute hemorrhage, mass, mass effect, or midline shift. No hydrocephalus or extra-axial collection. Pituitary and craniocervical junction within normal limits. Remote right posterior lentiform nucleus infarct. Vascular: Normal arterial flow voids. Skull and upper cervical spine: Redemonstrated expansile right parietal bone lesion, with CT features suggestive of fibrous dysplasia. Otherwise normal marrow signal. Sinuses/Orbits: Mucosal thickening in the maxillary sinuses with right maxillary air-fluid level. No acute finding in the orbits. Other: The mastoid air cells are well aerated. IMPRESSION: 1. No acute intracranial process. No evidence of acute or subacute infarct. 2. Right maxillary air-fluid level, which can be seen in the setting of acute sinusitis. Correlate with symptoms. Electronically Signed   By: Wiliam Ke M.D.   On: 01/07/2024 19:28   ECHOCARDIOGRAM COMPLETE Result Date: 01/07/2024    ECHOCARDIOGRAM REPORT   Patient Name:   [name] Costa Date of Exam: 01/07/2024 Medical Rec #:  [id number]        Height:  71.0 in Accession #:    [id number]       Weight:       228.0 lb Date of Birth:  06-15-1981        BSA:           2.229 m Patient Age:    42 years         BP:           147/78 mmHg Patient Gender: F                HR:           60 bpm. Exam Location:  Inpatient Procedure: 2D Echo, Cardiac Doppler and Color Doppler Indications:    Stroke  History:        Patient has prior history of Echocardiogram examinations, most                 recent 06/04/2021. Risk Factors:Hypertension.  Sonographer:    Vern Claude Referring Phys: [id number] ASHISH ARORA IMPRESSIONS  1. Left ventricular ejection fraction, by estimation, is 60 to 65%. The left ventricle has normal function. The left ventricle has no regional wall motion abnormalities. Left ventricular diastolic parameters were normal.  2. Right ventricular systolic function is normal. The right ventricular size is normal.  3. The mitral valve is normal in structure. No evidence of mitral valve regurgitation. No evidence of mitral stenosis.  4. The aortic valve is normal in structure. Aortic valve regurgitation is not visualized. No aortic stenosis is present.  5. The inferior vena cava is normal in size with greater than 50% respiratory variability, suggesting right atrial pressure of 3 mmHg. FINDINGS  Left Ventricle: Left ventricular ejection fraction, by estimation, is 60 to 65%. The left ventricle has normal function. The left ventricle has no regional wall motion abnormalities. The left ventricular internal cavity size was normal in size. There is  no left ventricular hypertrophy. Left ventricular diastolic parameters were normal. Right Ventricle: The right ventricular size is normal. No increase in right ventricular wall thickness. Right ventricular systolic function is normal. Left Atrium: Left atrial size was normal in size. Right Atrium: Right atrial size was normal in size. Pericardium: There is no evidence of pericardial effusion. Mitral Valve: The mitral valve is normal in structure. No evidence of mitral valve regurgitation. No evidence of mitral valve stenosis. MV peak  gradient, 2.8 mmHg. The mean mitral valve gradient is 1.0 mmHg. Tricuspid Valve: The tricuspid valve is normal in structure. Tricuspid valve regurgitation is not demonstrated. No evidence of tricuspid stenosis. Aortic Valve: The aortic valve is normal in structure. Aortic valve regurgitation is not visualized. No aortic stenosis is present. Aortic valve mean gradient measures 5.0 mmHg. Aortic valve peak gradient measures 9.7 mmHg. Aortic valve area, by VTI measures 1.51 cm. Pulmonic Valve: The pulmonic valve was normal in structure. Pulmonic valve regurgitation is not visualized. No evidence of pulmonic stenosis. Aorta: The aortic root is normal in size and structure. Venous: The inferior vena cava is normal in size with greater than 50% respiratory variability, suggesting right atrial pressure of 3 mmHg. IAS/Shunts: No atrial level shunt detected by color flow Doppler.  LEFT VENTRICLE PLAX 2D LVIDd:         4.90 cm      Diastology LVIDs:         2.70 cm      LV e' medial:    8.05 cm/s LV PW:         0.80 cm  LV E/e' medial:  10.3 LV IVS:        0.80 cm      LV e' lateral:   14.40 cm/s LVOT diam:     1.80 cm      LV E/e' lateral: 5.8 LV SV:         51 LV SV Index:   23 LVOT Area:     2.54 cm  LV Volumes (MOD) LV vol d, MOD A2C: 127.0 ml LV vol d, MOD A4C: 108.0 ml LV vol s, MOD A2C: 40.4 ml LV vol s, MOD A4C: 38.5 ml LV SV MOD A2C:     86.6 ml LV SV MOD A4C:     108.0 ml LV SV MOD BP:      83.9 ml RIGHT VENTRICLE             IVC RV Basal diam:  4.10 cm     IVC diam: 2.10 cm RV Mid diam:    2.60 cm RV S prime:     12.70 cm/s TAPSE (M-mode): 2.5 cm LEFT ATRIUM             Index        RIGHT ATRIUM           Index LA diam:        3.60 cm 1.61 cm/m   RA Area:     15.70 cm LA Vol (A2C):   27.8 ml 12.47 ml/m  RA Volume:   44.00 ml  19.74 ml/m LA Vol (A4C):   23.7 ml 10.63 ml/m LA Biplane Vol: 26.8 ml 12.02 ml/m  AORTIC VALVE                     PULMONIC VALVE AV Area (Vmax):    1.59 cm      PV Vmax:        0.80 m/s AV Area (Vmean):   1.56 cm      PV Peak grad:  2.5 mmHg AV Area (VTI):     1.51 cm AV Vmax:           156.00 cm/s AV Vmean:          105.000 cm/s AV VTI:            0.337 m AV Peak Grad:      9.7 mmHg AV Mean Grad:      5.0 mmHg LVOT Vmax:         97.50 cm/s LVOT Vmean:        64.200 cm/s LVOT VTI:          0.200 m LVOT/AV VTI ratio: 0.59  AORTA Ao Root diam: 2.50 cm Ao Asc diam:  2.80 cm MITRAL VALVE MV Area (PHT): 2.44 cm    SHUNTS MV Area VTI:   1.83 cm    Systemic VTI:  0.20 m MV Peak grad:  2.8 mmHg    Systemic Diam: 1.80 cm MV Mean grad:  1.0 mmHg MV Vmax:       0.83 m/s MV Vmean:      46.0 cm/s MV E velocity: 82.90 cm/s MV A velocity: 61.60 cm/s MV E/A ratio:  1.35 Kardie Tobb DO Electronically signed by Thomasene Ripple DO Signature Date/Time: 01/07/2024/12:33:44 PM    Final    CT ANGIO HEAD NECK W WO CM (CODE STROKE) Result Date: 01/06/2024 CLINICAL DATA:  Neuro deficit, acute, stroke suspected. Central retinal artery occlusion suspected on the right. Loss of vision. EXAM: CT ANGIOGRAPHY  HEAD AND NECK WITH AND WITHOUT CONTRAST TECHNIQUE: Multidetector CT imaging of the head and neck was performed using the standard protocol during bolus administration of intravenous contrast. Multiplanar CT image reconstructions and MIPs were obtained to evaluate the vascular anatomy. Carotid stenosis measurements (when applicable) are obtained utilizing NASCET criteria, using the distal internal carotid diameter as the denominator. RADIATION DOSE REDUCTION: This exam was performed according to the departmental dose-optimization program which includes automated exposure control, adjustment of the mA and/or kV according to patient size and/or use of iterative reconstruction technique. CONTRAST:  75mL OMNIPAQUE IOHEXOL 350 MG/ML SOLN COMPARISON:  Head CT same day prior CT angiography 01/08/2023 FINDINGS: CTA NECK FINDINGS Aortic arch: Aortic arch is normal.  Branching pattern is normal. Right carotid system: Common  carotid artery widely patent to the bifurcation. Normal bifurcation. Normal cervical ICA. Left carotid system: Left carotid system similarly normal. Vertebral arteries: Proximal vertebral artery detail is limited because of poor contrast opacification and regional dense venous reflux. Beyond proximal extent, the vertebral arteries are patent through the cervical region to the foramen magnum. Skeleton: No significant bone abnormality. Other neck: No mass or lymphadenopathy. Upper chest: Early emphysematous change. No focal finding otherwise. Review of the MIP images confirms the above findings CTA HEAD FINDINGS Anterior circulation: Both internal carotid arteries are patent through the skull base and siphon regions. No siphon stenosis. Minimal siphon atherosclerotic calcification. The anterior and middle cerebral vessels are patent. No large vessel occlusion or proximal stenosis. No aneurysm or vascular malformation. Posterior circulation: Both vertebral arteries are patent through the foramen magnum to the basilar artery. No basilar stenosis. Posterior circulation branch vessels are normal. Venous sinuses: Patent and normal. Anatomic variants: Azygous anterior cerebral artery. Review of the MIP images confirms the above findings IMPRESSION: 1. No intracranial large vessel occlusion or proximal stenosis. 2. Normal carotid bifurcations. 3. Proximal vertebral artery detail is limited because of poor arterial contrast opacification and regional dense venous reflux. Beyond proximal extent, the vertebral arteries are patent through the cervical region to the foramen magnum. 4. Early emphysematous change in the upper lungs. Electronically Signed   By: Paulina Fusi M.D.   On: 01/06/2024 20:18   CT HEAD CODE STROKE WO CONTRAST Result Date: 01/06/2024 CLINICAL DATA:  Code stroke. Initial evaluation for acute neuro deficit, stroke suspected. EXAM: CT HEAD WITHOUT CONTRAST TECHNIQUE: Contiguous axial images were obtained from  the base of the skull through the vertex without intravenous contrast. RADIATION DOSE REDUCTION: This exam was performed according to the departmental dose-optimization program which includes automated exposure control, adjustment of the mA and/or kV according to patient size and/or use of iterative reconstruction technique. COMPARISON:  Prior study from 01/08/2023 FINDINGS: Brain: Cerebral volume within normal limits. Chronic right basal ganglia lacunar infarct. No acute intracranial hemorrhage. No acute cortically based infarct. No mass lesion, midline shift or mass effect. No hydrocephalus or extra-axial fluid collection. Vascular: No abnormal hyperdense vessel. Skull: Scalp soft tissues demonstrate no acute finding. 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 Calvarium otherwise intact. Sinuses/Orbits: Globes and orbital soft tissues within normal limits. Changes of chronic maxillary sinusitis, right greater than left. No mastoid effusion. Other: None. ASPECTS [facility] Stroke Program Early CT Score) - Ganglionic level infarction (caudate, lentiform nuclei, internal capsule, insula, M1-M3 cortex): 7 - Supraganglionic infarction (M4-M6 cortex): 3 Total score (0-10 with 10 being normal): 10 IMPRESSION: 1. No acute intracranial abnormality. 2. ASPECTS is 10. 3. Chronic right basal ganglia  lacunar infarct. These results were communicated to Dr. Wilford Corner at 8:00 pm on 01/06/2024 by text page via the [facility] messaging system. Electronically Signed   By: Rise Mu M.D.   On: 01/06/2024 20:05    ASSESSMENT & PLAN ***  No orders of the defined types were placed in this encounter.   All questions were answered. The patient knows to call the clinic with any problems, questions or concerns.  I have spent a total of {CHL ONC TIME VISIT - [id number]} minutes of face-to-face and non-face-to-face time, preparing to see the patient, obtaining and/or  reviewing separately obtained history, performing a medically appropriate examination, counseling and educating the patient, ordering medications/tests/procedures, referring and communicating with other health care professionals, documenting clinical information in the electronic health record, independently interpreting results and communicating results to the patient, and care coordination.   Georga Kaufmann, PA-C Department of Hematology/Oncology [facility] at [facility] Phone: [phone]

## 2024-01-12 ENCOUNTER — Inpatient Hospital Stay: Payer: Self-pay

## 2024-01-12 ENCOUNTER — Inpatient Hospital Stay: Payer: Self-pay | Attending: Physician Assistant | Admitting: Physician Assistant

## 2024-01-12 ENCOUNTER — Encounter: Payer: Self-pay | Admitting: Physician Assistant

## 2024-01-12 VITALS — BP 140/83 | HR 88 | Temp 97.7°F | Resp 16 | Wt 234.4 lb

## 2024-01-12 DIAGNOSIS — Z803 Family history of malignant neoplasm of breast: Secondary | ICD-10-CM | POA: Diagnosis not present

## 2024-01-12 DIAGNOSIS — D6861 Antiphospholipid syndrome: Secondary | ICD-10-CM | POA: Diagnosis not present

## 2024-01-12 DIAGNOSIS — J320 Chronic maxillary sinusitis: Secondary | ICD-10-CM | POA: Diagnosis not present

## 2024-01-12 DIAGNOSIS — H5461 Unqualified visual loss, right eye, normal vision left eye: Secondary | ICD-10-CM

## 2024-01-12 DIAGNOSIS — Z9049 Acquired absence of other specified parts of digestive tract: Secondary | ICD-10-CM | POA: Diagnosis not present

## 2024-01-12 DIAGNOSIS — I872 Venous insufficiency (chronic) (peripheral): Secondary | ICD-10-CM

## 2024-01-12 DIAGNOSIS — E282 Polycystic ovarian syndrome: Secondary | ICD-10-CM | POA: Diagnosis not present

## 2024-01-12 DIAGNOSIS — H3411 Central retinal artery occlusion, right eye: Secondary | ICD-10-CM

## 2024-01-12 DIAGNOSIS — Z7901 Long term (current) use of anticoagulants: Secondary | ICD-10-CM

## 2024-01-12 DIAGNOSIS — N920 Excessive and frequent menstruation with regular cycle: Secondary | ICD-10-CM

## 2024-01-12 DIAGNOSIS — F1290 Cannabis use, unspecified, uncomplicated: Secondary | ICD-10-CM | POA: Diagnosis not present

## 2024-01-12 DIAGNOSIS — Z79899 Other long term (current) drug therapy: Secondary | ICD-10-CM | POA: Diagnosis not present

## 2024-01-12 DIAGNOSIS — Z8249 Family history of ischemic heart disease and other diseases of the circulatory system: Secondary | ICD-10-CM

## 2024-01-12 DIAGNOSIS — Z818 Family history of other mental and behavioral disorders: Secondary | ICD-10-CM | POA: Diagnosis not present

## 2024-01-12 DIAGNOSIS — I6381 Other cerebral infarction due to occlusion or stenosis of small artery: Secondary | ICD-10-CM | POA: Diagnosis not present

## 2024-01-12 DIAGNOSIS — F17210 Nicotine dependence, cigarettes, uncomplicated: Secondary | ICD-10-CM

## 2024-01-12 DIAGNOSIS — Z8673 Personal history of transient ischemic attack (TIA), and cerebral infarction without residual deficits: Secondary | ICD-10-CM

## 2024-01-12 DIAGNOSIS — I10 Essential (primary) hypertension: Secondary | ICD-10-CM | POA: Diagnosis not present

## 2024-01-12 LAB — CBC WITH DIFFERENTIAL (CANCER CENTER ONLY)
Abs Immature Granulocytes: 0.03 10*3/uL (ref 0.00–0.07)
Basophils Absolute: 0 10*3/uL (ref 0.0–0.1)
Basophils Relative: 0 %
Eosinophils Absolute: 0.1 10*3/uL (ref 0.0–0.5)
Eosinophils Relative: 1 %
HCT: 40.8 % (ref 36.0–46.0)
Hemoglobin: 12.9 g/dL (ref 12.0–15.0)
Immature Granulocytes: 0 %
Lymphocytes Relative: 28 %
Lymphs Abs: 2.8 10*3/uL (ref 0.7–4.0)
MCH: 26.4 pg (ref 26.0–34.0)
MCHC: 31.6 g/dL (ref 30.0–36.0)
MCV: 83.6 fL (ref 80.0–100.0)
Monocytes Absolute: 0.6 10*3/uL (ref 0.1–1.0)
Monocytes Relative: 6 %
Neutro Abs: 6.3 10*3/uL (ref 1.7–7.7)
Neutrophils Relative %: 65 %
Platelet Count: 275 10*3/uL (ref 150–400)
RBC: 4.88 MIL/uL (ref 3.87–5.11)
RDW: 17.1 % — ABNORMAL HIGH (ref 11.5–15.5)
WBC Count: 9.8 10*3/uL (ref 4.0–10.5)
nRBC: 0 % (ref 0.0–0.2)

## 2024-01-12 LAB — CMP (CANCER CENTER ONLY)
ALT: 33 U/L (ref 0–44)
AST: 44 U/L — ABNORMAL HIGH (ref 15–41)
Albumin: 4.1 g/dL (ref 3.5–5.0)
Alkaline Phosphatase: 79 U/L (ref 38–126)
Anion gap: 7 (ref 5–15)
BUN: 7 mg/dL (ref 6–20)
CO2: 27 mmol/L (ref 22–32)
Calcium: 9.1 mg/dL (ref 8.9–10.3)
Chloride: 105 mmol/L (ref 98–111)
Creatinine: 0.65 mg/dL (ref 0.44–1.00)
GFR, Estimated: >60 mL/min (ref >60)
Glucose, Bld: 80 mg/dL (ref 70–99)
Potassium: 3.5 mmol/L (ref 3.5–5.1)
Sodium: 139 mmol/L (ref 135–145)
Total Bilirubin: 0.3 mg/dL (ref 0.0–1.2)
Total Protein: 7.6 g/dL (ref 6.5–8.1)

## 2024-01-12 LAB — IRON AND IRON BINDING CAPACITY (CC-WL,HP ONLY)
Iron: 48 ug/dL (ref 28–170)
Saturation Ratios: 11 % (ref 10.4–31.8)
TIBC: 441 ug/dL (ref 250–450)
UIBC: 393 ug/dL (ref 148–442)

## 2024-01-12 LAB — PROTIME-INR
INR: 1.5 — ABNORMAL HIGH (ref 0.8–1.2)
Prothrombin Time: 18.4 s — ABNORMAL HIGH (ref 11.4–15.2)

## 2024-01-13 ENCOUNTER — Telehealth: Payer: Self-pay | Admitting: Physician Assistant

## 2024-01-13 DIAGNOSIS — D6861 Antiphospholipid syndrome: Secondary | ICD-10-CM

## 2024-01-13 LAB — FERRITIN: Ferritin: 15 ng/mL (ref 11–307)

## 2024-01-13 NOTE — Telephone Encounter (Signed)
I called [name] Costa today to review the lab results from yesterday.  Her INR level has improved to 1.5 but is subtherapeutic.  Recommend to continue on Coumadin 7.5 mg p.o. daily rather than decreasing to 5 mg.  She is scheduled to have a follow-up with her primary care on 01/28/2024 who will take over management of her Coumadin dose.  In the interim, she will return next week for a repeat INR check.  In addition, her iron panel show she does have iron deficiency.  This is likely secondary to heavier menstrual bleeding after initiation of anticoagulation.  Advised [name] Costa to start over-the-counter ferrous sulfate 325 mg p.o. daily with a source of vitamin C.  We will see [name] Costa back in 3 months with repeat labs as originally discussed.  [name] Costa expressed understanding and satisfaction with plan provided.

## 2024-01-19 ENCOUNTER — Inpatient Hospital Stay: Payer: Self-pay

## 2024-01-19 DIAGNOSIS — D6861 Antiphospholipid syndrome: Secondary | ICD-10-CM

## 2024-01-19 LAB — PROTIME-INR
INR: 1.9 — ABNORMAL HIGH (ref 0.8–1.2)
Prothrombin Time: 21.7 s — ABNORMAL HIGH (ref 11.4–15.2)

## 2024-01-19 NOTE — Progress Notes (Unsigned)
Office Visit Note  Patient: [name] Costa             Date of Birth: 05-06-1981           MRN: [id number]             PCP: The [facility] Referring: [facility]* Visit Date: 01/20/2024 Occupation: @GUAROCC @  Subjective:  Discuss medication options   History of Present Illness: [name] Costa is a 43 y.o. female with history of antiphospholipid syndrome.  Patient presented to the ED on 01/06/24 with right eye blindness.  She is currently prescribed warfarin and presents today to discuss initiating plaquenil.     01/06/24 CVA  Warfarin   CBC and BMP updated on 01/08/24.   Patient was counseled on the purpose, proper use, and adverse effects of hydroxychloroquine including nausea/diarrhea, skin rash, headaches, and sun sensitivity.  Advised patient to wear sunscreen once starting hydroxychloroquine to reduce risk of rash associated with sun sensitivity.  Discussed importance of annual eye exams while on hydroxychloroquine to monitor to ocular toxicity and discussed importance of frequent laboratory monitoring.  Provided patient with eye exam form for baseline ophthalmologic exam.  Provided patient with educational materials on hydroxychloroquine and answered all questions.  Patient consented to hydroxychloroquine. Will upload consent in the media tab.    Reviewed risk for QTC prolongation when used in combination with other QTc prolonging agents (including but not limited to antiarrhythmics, macrolide antibiotics, flouroquinolone antibiotics, haloperidol, quetiapine, olanzapine, risperidone, droperidol, ziprasidone, amitriptyline, citalopram, ondansetron, migraine triptans, and methadone). EKG shows QTc wnl on ***.  Dose will be {Plaquenil Dosing:22185}.  Prescription pending lab results.   Activities of Daily Living:  Patient reports morning stiffness for *** {minute/hour:19697}.   Patient {ACTIONS;DENIES/REPORTS:21021675::"Denies"} nocturnal pain.   Difficulty dressing/grooming: {ACTIONS;DENIES/REPORTS:21021675::"Denies"} Difficulty climbing stairs: {ACTIONS;DENIES/REPORTS:21021675::"Denies"} Difficulty getting out of chair: {ACTIONS;DENIES/REPORTS:21021675::"Denies"} Difficulty using hands for taps, buttons, cutlery, and/or writing: {ACTIONS;DENIES/REPORTS:21021675::"Denies"}  No Rheumatology ROS completed.   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   TOBACCO ABUSE 10/15/2009   ALLERGIC RHINITIS 10/15/2009   ASTHMA 10/15/2009    Past Medical History:  Diagnosis Date   Bladder infection    Chest wall pain    PCOS (polycystic ovarian syndrome)    Stroke (HCC) 06/04/2021   Trichomonas     Family History  Problem Relation Age of Onset   Hypertension Mother    Hypertension Father    Hypertension Sister    Hypertension Brother    Breast cancer Maternal Grandmother    Healthy Daughter    Healthy Son    Autism Son    Past Surgical History:  Procedure Laterality Date   BREAST BIOPSY Right 05/25/2023   [place] RT BREAST BX W LOC DEV 1ST LESION IMG BX SPEC US GUIDE 05/25/2023 [facility] MAMMOGRAPHY   BUBBLE STUDY  06/06/2021   Procedure: BUBBLE STUDY;  Surgeon: [name], MD;  Location: [facility] ENDOSCOPY;  Service: Cardiovascular;;   CHOLECYSTECTOMY     CHOLECYSTECTOMY     COLONOSCOPY  2006   TEE WITHOUT CARDIOVERSION N/A 06/06/2021   Procedure: TRANSESOPHAGEAL ECHOCARDIOGRAM (TEE);  Surgeon: [name], MD;  Location: [facility] ENDOSCOPY;  Service: Cardiovascular;  Laterality: N/A;   TUBAL LIGATION Bilateral 04/13/2013  Procedure: POST PARTUM TUBAL LIGATION;  Surgeon: Bing Plume, MD;  Location: [facility] ORS;  Service: Gynecology;  Laterality: Bilateral;   Social History    Social History Narrative   Not on file   There is no immunization history for the selected administration types on file for this patient.   Objective: Vital Signs: There were no vitals taken for this visit.   Physical Exam Vitals and nursing note reviewed.  Constitutional:      Appearance: She is well-developed.  HENT:     Head: Normocephalic and atraumatic.  Eyes:     Conjunctiva/sclera: Conjunctivae normal.  Cardiovascular:     Rate and Rhythm: Normal rate and regular rhythm.     Heart sounds: Normal heart sounds.  Pulmonary:     Effort: Pulmonary effort is normal.     Breath sounds: Normal breath sounds.  Abdominal:     General: Bowel sounds are normal.     Palpations: Abdomen is soft.  Musculoskeletal:     Cervical back: Normal range of motion.  Lymphadenopathy:     Cervical: No cervical adenopathy.  Skin:    General: Skin is warm and dry.     Capillary Refill: Capillary refill takes less than 2 seconds.  Neurological:     Mental Status: She is alert and oriented to person, place, and time.  Psychiatric:        Behavior: Behavior normal.      Musculoskeletal Exam: ***  CDAI Exam: CDAI Score: -- Patient Global: --; Provider Global: -- Swollen: --; Tender: -- Joint Exam 01/20/2024   No joint exam has been documented for this visit   There is currently no information documented on the homunculus. Go to the Rheumatology activity and complete the homunculus joint exam.  Investigation: No additional findings.  Imaging: MR BRAIN WO CONTRAST Result Date: 01/07/2024 CLINICAL DATA:  Sudden vision loss in the right eye, history of right basal ganglia infarct in 2022 EXAM: MRI HEAD WITHOUT CONTRAST TECHNIQUE: Multiplanar, multiecho pulse sequences of the brain and surrounding structures were obtained without intravenous contrast. COMPARISON:  06/04/2021 MRI head, correlation is made with 01/06/2024 CT head FINDINGS: Brain: No restricted diffusion to suggest acute or  subacute infarct. No acute hemorrhage, mass, mass effect, or midline shift. No hydrocephalus or extra-axial collection. Pituitary and craniocervical junction within normal limits. Remote right posterior lentiform nucleus infarct. Vascular: Normal arterial flow voids. Skull and upper cervical spine: Redemonstrated expansile right parietal bone lesion, with CT features suggestive of fibrous dysplasia. Otherwise normal marrow signal. Sinuses/Orbits: Mucosal thickening in the maxillary sinuses with right maxillary air-fluid level. No acute finding in the orbits. Other: The mastoid air cells are well aerated. IMPRESSION: 1. No acute intracranial process. No evidence of acute or subacute infarct. 2. Right maxillary air-fluid level, which can be seen in the setting of acute sinusitis. Correlate with symptoms. Electronically Signed   By: Wiliam Ke M.D.   On: 01/07/2024 19:28   ECHOCARDIOGRAM COMPLETE Result Date: 01/07/2024    ECHOCARDIOGRAM REPORT   Patient Name:   [name] Costa Date of Exam: 01/07/2024 Medical Rec #:  [id number]        Height:       71.0 in Accession #:    [id number]       Weight:       228.0 lb Date of Birth:  07/02/1981        BSA:          2.229 m Patient Age:  42 years         BP:           147/78 mmHg Patient Gender: F                HR:           60 bpm. Exam Location:  Inpatient Procedure: 2D Echo, Cardiac Doppler and Color Doppler Indications:    Stroke  History:        Patient has prior history of Echocardiogram examinations, most                 recent 06/04/2021. Risk Factors:Hypertension.  Sonographer:    Vern Claude Referring Phys: [id number] ASHISH ARORA IMPRESSIONS  1. Left ventricular ejection fraction, by estimation, is 60 to 65%. The left ventricle has normal function. The left ventricle has no regional wall motion abnormalities. Left ventricular diastolic parameters were normal.  2. Right ventricular systolic function is normal. The right ventricular size is normal.  3. The mitral  valve is normal in structure. No evidence of mitral valve regurgitation. No evidence of mitral stenosis.  4. The aortic valve is normal in structure. Aortic valve regurgitation is not visualized. No aortic stenosis is present.  5. The inferior vena cava is normal in size with greater than 50% respiratory variability, suggesting right atrial pressure of 3 mmHg. FINDINGS  Left Ventricle: Left ventricular ejection fraction, by estimation, is 60 to 65%. The left ventricle has normal function. The left ventricle has no regional wall motion abnormalities. The left ventricular internal cavity size was normal in size. There is  no left ventricular hypertrophy. Left ventricular diastolic parameters were normal. Right Ventricle: The right ventricular size is normal. No increase in right ventricular wall thickness. Right ventricular systolic function is normal. Left Atrium: Left atrial size was normal in size. Right Atrium: Right atrial size was normal in size. Pericardium: There is no evidence of pericardial effusion. Mitral Valve: The mitral valve is normal in structure. No evidence of mitral valve regurgitation. No evidence of mitral valve stenosis. MV peak gradient, 2.8 mmHg. The mean mitral valve gradient is 1.0 mmHg. Tricuspid Valve: The tricuspid valve is normal in structure. Tricuspid valve regurgitation is not demonstrated. No evidence of tricuspid stenosis. Aortic Valve: The aortic valve is normal in structure. Aortic valve regurgitation is not visualized. No aortic stenosis is present. Aortic valve mean gradient measures 5.0 mmHg. Aortic valve peak gradient measures 9.7 mmHg. Aortic valve area, by VTI measures 1.51 cm. Pulmonic Valve: The pulmonic valve was normal in structure. Pulmonic valve regurgitation is not visualized. No evidence of pulmonic stenosis. Aorta: The aortic root is normal in size and structure. Venous: The inferior vena cava is normal in size with greater than 50% respiratory variability,  suggesting right atrial pressure of 3 mmHg. IAS/Shunts: No atrial level shunt detected by color flow Doppler.  LEFT VENTRICLE PLAX 2D LVIDd:         4.90 cm      Diastology LVIDs:         2.70 cm      LV e' medial:    8.05 cm/s LV PW:         0.80 cm      LV E/e' medial:  10.3 LV IVS:        0.80 cm      LV e' lateral:   14.40 cm/s LVOT diam:     1.80 cm      LV E/e' lateral: 5.8 LV SV:  51 LV SV Index:   23 LVOT Area:     2.54 cm  LV Volumes (MOD) LV vol d, MOD A2C: 127.0 ml LV vol d, MOD A4C: 108.0 ml LV vol s, MOD A2C: 40.4 ml LV vol s, MOD A4C: 38.5 ml LV SV MOD A2C:     86.6 ml LV SV MOD A4C:     108.0 ml LV SV MOD BP:      83.9 ml RIGHT VENTRICLE             IVC RV Basal diam:  4.10 cm     IVC diam: 2.10 cm RV Mid diam:    2.60 cm RV S prime:     12.70 cm/s TAPSE (M-mode): 2.5 cm LEFT ATRIUM             Index        RIGHT ATRIUM           Index LA diam:        3.60 cm 1.61 cm/m   RA Area:     15.70 cm LA Vol (A2C):   27.8 ml 12.47 ml/m  RA Volume:   44.00 ml  19.74 ml/m LA Vol (A4C):   23.7 ml 10.63 ml/m LA Biplane Vol: 26.8 ml 12.02 ml/m  AORTIC VALVE                     PULMONIC VALVE AV Area (Vmax):    1.59 cm      PV Vmax:       0.80 m/s AV Area (Vmean):   1.56 cm      PV Peak grad:  2.5 mmHg AV Area (VTI):     1.51 cm AV Vmax:           156.00 cm/s AV Vmean:          105.000 cm/s AV VTI:            0.337 m AV Peak Grad:      9.7 mmHg AV Mean Grad:      5.0 mmHg LVOT Vmax:         97.50 cm/s LVOT Vmean:        64.200 cm/s LVOT VTI:          0.200 m LVOT/AV VTI ratio: 0.59  AORTA Ao Root diam: 2.50 cm Ao Asc diam:  2.80 cm MITRAL VALVE MV Area (PHT): 2.44 cm    SHUNTS MV Area VTI:   1.83 cm    Systemic VTI:  0.20 m MV Peak grad:  2.8 mmHg    Systemic Diam: 1.80 cm MV Mean grad:  1.0 mmHg MV Vmax:       0.83 m/s MV Vmean:      46.0 cm/s MV E velocity: 82.90 cm/s MV A velocity: 61.60 cm/s MV E/A ratio:  1.35 Kardie Tobb DO Electronically signed by Thomasene Ripple DO Signature Date/Time:  01/07/2024/12:33:44 PM    Final    CT ANGIO HEAD NECK W WO CM (CODE STROKE) Result Date: 01/06/2024 CLINICAL DATA:  Neuro deficit, acute, stroke suspected. Central retinal artery occlusion suspected on the right. Loss of vision. EXAM: CT ANGIOGRAPHY HEAD AND NECK WITH AND WITHOUT CONTRAST TECHNIQUE: Multidetector CT imaging of the head and neck was performed using the standard protocol during bolus administration of intravenous contrast. Multiplanar CT image reconstructions and MIPs were obtained to evaluate the vascular anatomy. Carotid stenosis measurements (when applicable) are obtained utilizing NASCET criteria, using the distal internal  carotid diameter as the denominator. RADIATION DOSE REDUCTION: This exam was performed according to the departmental dose-optimization program which includes automated exposure control, adjustment of the mA and/or kV according to patient size and/or use of iterative reconstruction technique. CONTRAST:  75mL OMNIPAQUE IOHEXOL 350 MG/ML SOLN COMPARISON:  Head CT same day prior CT angiography 01/08/2023 FINDINGS: CTA NECK FINDINGS Aortic arch: Aortic arch is normal.  Branching pattern is normal. Right carotid system: Common carotid artery widely patent to the bifurcation. Normal bifurcation. Normal cervical ICA. Left carotid system: Left carotid system similarly normal. Vertebral arteries: Proximal vertebral artery detail is limited because of poor contrast opacification and regional dense venous reflux. Beyond proximal extent, the vertebral arteries are patent through the cervical region to the foramen magnum. Skeleton: No significant bone abnormality. Other neck: No mass or lymphadenopathy. Upper chest: Early emphysematous change. No focal finding otherwise. Review of the MIP images confirms the above findings CTA HEAD FINDINGS Anterior circulation: Both internal carotid arteries are patent through the skull base and siphon regions. No siphon stenosis. Minimal siphon  atherosclerotic calcification. The anterior and middle cerebral vessels are patent. No large vessel occlusion or proximal stenosis. No aneurysm or vascular malformation. Posterior circulation: Both vertebral arteries are patent through the foramen magnum to the basilar artery. No basilar stenosis. Posterior circulation branch vessels are normal. Venous sinuses: Patent and normal. Anatomic variants: Azygous anterior cerebral artery. Review of the MIP images confirms the above findings IMPRESSION: 1. No intracranial large vessel occlusion or proximal stenosis. 2. Normal carotid bifurcations. 3. Proximal vertebral artery detail is limited because of poor arterial contrast opacification and regional dense venous reflux. Beyond proximal extent, the vertebral arteries are patent through the cervical region to the foramen magnum. 4. Early emphysematous change in the upper lungs. Electronically Signed   By: Paulina Fusi M.D.   On: 01/06/2024 20:18   CT HEAD CODE STROKE WO CONTRAST Result Date: 01/06/2024 CLINICAL DATA:  Code stroke. Initial evaluation for acute neuro deficit, stroke suspected. EXAM: CT HEAD WITHOUT CONTRAST TECHNIQUE: Contiguous axial images were obtained from the base of the skull through the vertex without intravenous contrast. RADIATION DOSE REDUCTION: This exam was performed according to the departmental dose-optimization program which includes automated exposure control, adjustment of the mA and/or kV according to patient size and/or use of iterative reconstruction technique. COMPARISON:  Prior study from 01/08/2023 FINDINGS: Brain: Cerebral volume within normal limits. Chronic right basal ganglia lacunar infarct. No acute intracranial hemorrhage. No acute cortically based infarct. No mass lesion, midline shift or mass effect. No hydrocephalus or extra-axial fluid collection. Vascular: No abnormal hyperdense vessel. Skull: Scalp soft tissues demonstrate no acute finding. 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 Calvarium otherwise intact. Sinuses/Orbits: Globes and orbital soft tissues within normal limits. Changes of chronic maxillary sinusitis, right greater than left. No mastoid effusion. Other: None. ASPECTS [facility] Stroke Program Early CT Score) - Ganglionic level infarction (caudate, lentiform nuclei, internal capsule, insula, M1-M3 cortex): 7 - Supraganglionic infarction (M4-M6 cortex): 3 Total score (0-10 with 10 being normal): 10 IMPRESSION: 1. No acute intracranial abnormality. 2. ASPECTS is 10. 3. Chronic right basal ganglia lacunar infarct. These results were communicated to Dr. Wilford Corner at 8:00 pm on 01/06/2024 by text page via the [facility] messaging system. Electronically Signed   By: Rise Mu M.D.   On: 01/06/2024 20:05    Recent Labs: Lab Results  Component Value Date   WBC 9.8 01/12/2024   HGB  12.9 01/12/2024   PLT 275 01/12/2024   NA 139 01/12/2024   K 3.5 01/12/2024   CL 105 01/12/2024   CO2 27 01/12/2024   GLUCOSE 80 01/12/2024   BUN 7 01/12/2024   CREATININE 0.65 01/12/2024   BILITOT 0.3 01/12/2024   ALKPHOS 79 01/12/2024   AST 44 (H) 01/12/2024   ALT 33 01/12/2024   PROT 7.6 01/12/2024   ALBUMIN 4.1 01/12/2024   CALCIUM 9.1 01/12/2024    Speciality Comments: No specialty comments available.  Procedures:  No procedures performed Allergies: Latex   Assessment / Plan:     Visit Diagnoses: Positive ANA (antinuclear antibody)  Anticardiolipin antibody positive  Chronic left shoulder pain  Lateral epicondylitis, right elbow  Chronic pain of both ankles  Vitamin D deficiency  Primary hypertension  History of CVA (cerebrovascular accident)  Anxiety and depression  History of anemia  History of asthma  Smoker  Orders: No orders of the defined types were placed in this encounter.  No orders of the defined types were placed in this encounter.   Face-to-face time spent with  patient was *** minutes. Greater than 50% of time was spent in counseling and coordination of care.  Follow-Up Instructions: No follow-ups on file.   Gearldine Bienenstock, PA-C  Note - This record has been created using Dragon software.  Chart creation errors have been sought, but may not always  have been located. Such creation errors do not reflect on  the standard of medical care.

## 2024-01-20 ENCOUNTER — Telehealth: Payer: Self-pay

## 2024-01-20 ENCOUNTER — Encounter: Payer: Self-pay | Admitting: Physician Assistant

## 2024-01-20 ENCOUNTER — Ambulatory Visit: Payer: Self-pay | Attending: Physician Assistant | Admitting: Physician Assistant

## 2024-01-20 VITALS — BP 145/92 | HR 68 | Resp 16 | Ht 70.0 in | Wt 229.4 lb

## 2024-01-20 DIAGNOSIS — F32A Depression, unspecified: Secondary | ICD-10-CM

## 2024-01-20 DIAGNOSIS — Z7901 Long term (current) use of anticoagulants: Secondary | ICD-10-CM

## 2024-01-20 DIAGNOSIS — I10 Essential (primary) hypertension: Secondary | ICD-10-CM

## 2024-01-20 DIAGNOSIS — F419 Anxiety disorder, unspecified: Secondary | ICD-10-CM

## 2024-01-20 DIAGNOSIS — M25571 Pain in right ankle and joints of right foot: Secondary | ICD-10-CM

## 2024-01-20 DIAGNOSIS — G8929 Other chronic pain: Secondary | ICD-10-CM

## 2024-01-20 DIAGNOSIS — R768 Other specified abnormal immunological findings in serum: Secondary | ICD-10-CM

## 2024-01-20 DIAGNOSIS — Z79899 Other long term (current) drug therapy: Secondary | ICD-10-CM | POA: Diagnosis not present

## 2024-01-20 DIAGNOSIS — R760 Raised antibody titer: Secondary | ICD-10-CM

## 2024-01-20 DIAGNOSIS — Z862 Personal history of diseases of the blood and blood-forming organs and certain disorders involving the immune mechanism: Secondary | ICD-10-CM

## 2024-01-20 DIAGNOSIS — E559 Vitamin D deficiency, unspecified: Secondary | ICD-10-CM

## 2024-01-20 DIAGNOSIS — D6861 Antiphospholipid syndrome: Secondary | ICD-10-CM | POA: Diagnosis not present

## 2024-01-20 DIAGNOSIS — M25512 Pain in left shoulder: Secondary | ICD-10-CM

## 2024-01-20 DIAGNOSIS — F17200 Nicotine dependence, unspecified, uncomplicated: Secondary | ICD-10-CM

## 2024-01-20 DIAGNOSIS — Z8709 Personal history of other diseases of the respiratory system: Secondary | ICD-10-CM

## 2024-01-20 DIAGNOSIS — Z8673 Personal history of transient ischemic attack (TIA), and cerebral infarction without residual deficits: Secondary | ICD-10-CM

## 2024-01-20 DIAGNOSIS — M25572 Pain in left ankle and joints of left foot: Secondary | ICD-10-CM

## 2024-01-20 DIAGNOSIS — M7711 Lateral epicondylitis, right elbow: Secondary | ICD-10-CM

## 2024-01-20 MED ORDER — HYDROXYCHLOROQUINE SULFATE 200 MG PO TABS: 200.0000 mg | ORAL_TABLET | Freq: Two times a day (BID) | ORAL | 2 refills | Status: DC

## 2024-01-20 NOTE — Progress Notes (Addendum)
Pharmacy Note  Subjective: Patient presents today to [facility] for follow up office visit.   Patient seen by the pharmacist for counseling on hydroxychloroquine for  antiphospholipid syndrome .    Objective: CMP     Component Value Date/Time   NA 139 01/12/2024 1517   K 3.5 01/12/2024 1517   CL 105 01/12/2024 1517   CO2 27 01/12/2024 1517   GLUCOSE 80 01/12/2024 1517   BUN 7 01/12/2024 1517   CREATININE 0.65 01/12/2024 1517   CALCIUM 9.1 01/12/2024 1517   PROT 7.6 01/12/2024 1517   ALBUMIN 4.1 01/12/2024 1517   AST 44 (H) 01/12/2024 1517   ALT 33 01/12/2024 1517   ALKPHOS 79 01/12/2024 1517   BILITOT 0.3 01/12/2024 1517   GFRNONAA >60 01/12/2024 1517    CBC    Component Value Date/Time   WBC 9.8 01/12/2024 1517   WBC 9.3 01/08/2024 0409   RBC 4.88 01/12/2024 1517   HGB 12.9 01/12/2024 1517   HCT 40.8 01/12/2024 1517   PLT 275 01/12/2024 1517   MCV 83.6 01/12/2024 1517   MCH 26.4 01/12/2024 1517   MCHC 31.6 01/12/2024 1517   RDW 17.1 (H) 01/12/2024 1517   LYMPHSABS 2.8 01/12/2024 1517   MONOABS 0.6 01/12/2024 1517   EOSABS 0.1 01/12/2024 1517   BASOSABS 0.0 01/12/2024 1517    Assessment/Plan: Patient was counseled on the purpose, proper use, and adverse effects of hydroxychloroquine including nausea/diarrhea, skin rash, headaches, and sun sensitivity.  Advised patient to wear sunscreen once starting hydroxychloroquine to reduce risk of rash associated with sun sensitivity.  Discussed importance of annual eye exams while on hydroxychloroquine to monitor to ocular toxicity and discussed importance of frequent laboratory monitoring.  Provided patient with eye exam form for baseline ophthalmologic exam.  Provided patient with educational materials on hydroxychloroquine and answered all questions.  Patient consented to hydroxychloroquine. Will upload consent in the media tab.    Reviewed risk for QTC prolongation when used in combination with other QTc  prolonging agents (including but not limited to antiarrhythmics, macrolide antibiotics, flouroquinolone antibiotics, haloperidol, quetiapine, olanzapine, risperidone, droperidol, ziprasidone, amitriptyline, citalopram, ondansetron, migraine triptans, and methadone). EKG shows QTc wnl on 01/06/2024.  Dose will be Plaquenil 200 mg twice daily.  Prescription sent to pharmacy. Goodrx coupon provided to patient for [employer]. She has been advised tp download GoodRx app for coupons for all of her medications as well. Rx for CBC/CMP lab to be drawn in one month at hospital provided to patient.  She will f/u with Medicaid regarding application that may have been started when she was in the hospital.  Referral to ophthalmology placed [facility])  Chesley Mires, PharmD, MPH, BCPS, CPP Clinical Pharmacist (Rheumatology and Pulmonology)

## 2024-01-20 NOTE — Patient Instructions (Addendum)
Standing Labs We placed an order today for your standing lab work.   Please have your standing labs drawn in 1 month, 3months, then every 5 months   Please have your labs drawn 2 weeks prior to your appointment so that the provider can discuss your lab results at your appointment, if possible.  Please note that you may see your imaging and lab results in MyChart before we have reviewed them. We will contact you once all results are reviewed. Please allow our office up to 72 hours to thoroughly review all of the results before contacting the office for clarification of your results.  WALK-IN LAB HOURS  Monday through Thursday from 8:00 am -12:30 pm and 1:00 pm-5:00 pm and Friday from 8:00 am-12:00 pm.  Patients with office visits requiring labs will be seen before walk-in labs.  You may encounter longer than normal wait times. Please allow additional time. Wait times may be shorter on  Monday and Thursday afternoons.  We do not book appointments for walk-in labs. We appreciate your patience and understanding with our staff.   Labs are drawn by [facility]. Please bring your co-pay at the time of your lab draw.  You may receive a bill from [facility] for your lab work.  Please note if you are on Hydroxychloroquine and and an order has been placed for a Hydroxychloroquine level,  you will need to have it drawn 4 hours or more after your last dose.  If you wish to have your labs drawn at another location, please call the office 24 hours in advance so we can fax the orders.  The office is located at [address], [place], [place] [address]   If you have any questions regarding directions or hours of operation,  please call [phone].   As a reminder, please drink plenty of water prior to coming for your lab work. Thanks!   Hydroxychloroquine Tablets What is this medication? HYDROXYCHLOROQUINE (hye drox ee KLOR oh kwin) treats autoimmune conditions, such as rheumatoid arthritis and  lupus. It works by slowing down an overactive immune system. It may also be used to prevent and treat malaria. It works by killing the parasite that causes malaria. It belongs to a group of medications called DMARDs. This medicine may be used for other purposes; ask your health care provider or pharmacist if you have questions. COMMON BRAND NAME(S): Plaquenil, Quineprox, SOVUNA What should I tell my care team before I take this medication? They need to know if you have any of these conditions: Diabetes Eye disease, vision problems Frequently drink alcohol G6PD deficiency Heart disease Irregular heartbeat or rhythm Kidney disease Liver disease Porphyria Psoriasis An unusual or allergic reaction to hydroxychloroquine, other medications, foods, dyes, or preservatives Pregnant or trying to get pregnant Breastfeeding How should I use this medication? Take this medication by mouth with water. Take it as directed on the prescription label. Do not cut, crush, or chew this medication. Swallow the tablets whole. Take it with food. Do not take it more than directed. Take all of this medication unless your care team tells you to stop it early. Keep taking it even if you think you are better. Take products with antacids in them at a different time of day than this medication. Take this medication 4 hours before or 4 hours after antacids. Talk to your care team if you have questions. Talk to your care team about the use of this medication in children. While this medication may be prescribed  for selected conditions, precautions do apply. Overdosage: If you think you have taken too much of this medicine contact a poison control center or emergency room at once. NOTE: This medicine is only for you. Do not share this medicine with others. What if I miss a dose? If you miss a dose, take it as soon as you can. If it is almost time for your next dose, take only that dose. Do not take double or extra doses. What  may interact with this medication? Do not take this medication with any of the following: Cisapride Dronedarone Pimozide Thioridazine This medication may also interact with the following: Ampicillin Antacids Cimetidine Cyclosporine Digoxin Kaolin Medications for diabetes, such as insulin, glipizide, glyburide Medications for seizures, such as carbamazepine, phenobarbital, phenytoin Mefloquine Methotrexate Other medications that cause heart rhythm changes Praziquantel This list may not describe all possible interactions. Give your health care provider a list of all the medicines, herbs, non-prescription drugs, or dietary supplements you use. Also tell them if you smoke, drink alcohol, or use illegal drugs. Some items may interact with your medicine. What should I watch for while using this medication? Visit your care team for regular checks on your progress. Tell your care team if your symptoms do not start to get better or if they get worse. You may need blood work done while you are taking this medication. If you take other medications that can affect heart rhythm, you may need more testing. Talk to your care team if you have questions. Your vision may be tested before and during use of this medication. Tell your care team right away if you have any change in your eyesight. This medication may cause serious skin reactions. They can happen weeks to months after starting the medication. Contact your care team right away if you notice fevers or flu-like symptoms with a rash. The rash may be red or purple and then turn into blisters or peeling of the skin. Or, you might notice a red rash with swelling of the face, lips or lymph nodes in your neck or under your arms. If you or your family notice any changes in your behavior, such as new or worsening depression, thoughts of harming yourself, anxiety, or other unusual or disturbing thoughts, or memory loss, call your care team right away. What  side effects may I notice from receiving this medication? Side effects that you should report to your care team as soon as possible: Allergic reactions--skin rash, itching, hives, swelling of the face, lips, tongue, or throat Aplastic anemia--unusual weakness or fatigue, dizziness, headache, trouble breathing, increased bleeding or bruising Change in vision Heart rhythm changes--fast or irregular heartbeat, dizziness, feeling faint or lightheaded, chest pain, trouble breathing Infection--fever, chills, cough, or sore throat Low blood sugar (hypoglycemia)--tremors or shaking, anxiety, sweating, cold or clammy skin, confusion, dizziness, rapid heartbeat Muscle injury--unusual weakness or fatigue, muscle pain, dark yellow or brown urine, decrease in amount of urine Pain, tingling, or numbness in the hands or feet Rash, fever, and swollen lymph nodes Redness, blistering, peeling, or loosening of the skin, including inside the mouth Thoughts of suicide or self-harm, worsening mood, or feelings of depression Unusual bruising or bleeding Side effects that usually do not require medical attention (report to your care team if they continue or are bothersome): Diarrhea Headache Nausea Stomach pain Vomiting This list may not describe all possible side effects. Call your doctor for medical advice about side effects. You may report side effects to FDA at 1-800-FDA-1088. Where  should I keep my medication? Keep out of the reach of children and pets. Store at room temperature up to 30 degrees C (86 degrees F). Protect from light. Get rid of any unused medication after the expiration date. To get rid of medications that are no longer needed or have expired: Take the medication to a medication take-back program. Check with your pharmacy or law enforcement to find a location. If you cannot return the medication, check the label or package insert to see if the medication should be thrown out in the garbage or  flushed down the toilet. If you are not sure, ask your care team. If it is safe to put it in the trash, empty the medication out of the container. Mix the medication with cat litter, dirt, coffee grounds, or other unwanted substance. Seal the mixture in a bag or container. Put it in the trash. NOTE: This sheet is a summary. It may not cover all possible information. If you have questions about this medicine, talk to your doctor, pharmacist, or health care provider.  2024 Elsevier/Gold Standard (2022-05-26 00:00:00)

## 2024-01-20 NOTE — Telephone Encounter (Signed)
Attempted to contact pt per Georga Kaufmann [facility] to let pt know: to continue her same dose of coumadin. Left this in a message for pt.

## 2024-01-21 ENCOUNTER — Other Ambulatory Visit: Payer: Self-pay | Admitting: Neurology

## 2024-01-21 NOTE — Telephone Encounter (Signed)
Last seen on 02/05/22 Follow up scheduled on 09/29/24

## 2024-02-04 DIAGNOSIS — D6861 Antiphospholipid syndrome: Secondary | ICD-10-CM | POA: Diagnosis not present

## 2024-02-12 DIAGNOSIS — D6861 Antiphospholipid syndrome: Secondary | ICD-10-CM | POA: Diagnosis not present

## 2024-03-01 DIAGNOSIS — F4310 Post-traumatic stress disorder, unspecified: Secondary | ICD-10-CM | POA: Diagnosis not present

## 2024-03-01 DIAGNOSIS — F329 Major depressive disorder, single episode, unspecified: Secondary | ICD-10-CM | POA: Diagnosis not present

## 2024-03-01 DIAGNOSIS — F411 Generalized anxiety disorder: Secondary | ICD-10-CM | POA: Diagnosis not present

## 2024-04-11 ENCOUNTER — Other Ambulatory Visit: Payer: Self-pay | Admitting: Physician Assistant

## 2024-04-11 DIAGNOSIS — D6861 Antiphospholipid syndrome: Secondary | ICD-10-CM

## 2024-04-12 ENCOUNTER — Inpatient Hospital Stay (HOSPITAL_BASED_OUTPATIENT_CLINIC_OR_DEPARTMENT_OTHER): Payer: Self-pay | Admitting: Physician Assistant

## 2024-04-12 ENCOUNTER — Inpatient Hospital Stay: Payer: Self-pay | Attending: Physician Assistant

## 2024-04-12 VITALS — BP 144/92 | HR 79 | Temp 97.5°F | Resp 14 | Wt 236.1 lb

## 2024-04-12 DIAGNOSIS — F1290 Cannabis use, unspecified, uncomplicated: Secondary | ICD-10-CM | POA: Diagnosis not present

## 2024-04-12 DIAGNOSIS — Z9049 Acquired absence of other specified parts of digestive tract: Secondary | ICD-10-CM | POA: Insufficient documentation

## 2024-04-12 DIAGNOSIS — Z7982 Long term (current) use of aspirin: Secondary | ICD-10-CM | POA: Insufficient documentation

## 2024-04-12 DIAGNOSIS — D6861 Antiphospholipid syndrome: Secondary | ICD-10-CM | POA: Diagnosis not present

## 2024-04-12 DIAGNOSIS — D508 Other iron deficiency anemias: Secondary | ICD-10-CM

## 2024-04-12 DIAGNOSIS — Z8249 Family history of ischemic heart disease and other diseases of the circulatory system: Secondary | ICD-10-CM | POA: Insufficient documentation

## 2024-04-12 DIAGNOSIS — Z818 Family history of other mental and behavioral disorders: Secondary | ICD-10-CM | POA: Diagnosis not present

## 2024-04-12 DIAGNOSIS — K5900 Constipation, unspecified: Secondary | ICD-10-CM | POA: Insufficient documentation

## 2024-04-12 DIAGNOSIS — H5461 Unqualified visual loss, right eye, normal vision left eye: Secondary | ICD-10-CM | POA: Diagnosis not present

## 2024-04-12 DIAGNOSIS — Z7901 Long term (current) use of anticoagulants: Secondary | ICD-10-CM | POA: Diagnosis not present

## 2024-04-12 DIAGNOSIS — H3411 Central retinal artery occlusion, right eye: Secondary | ICD-10-CM | POA: Diagnosis not present

## 2024-04-12 DIAGNOSIS — F17210 Nicotine dependence, cigarettes, uncomplicated: Secondary | ICD-10-CM | POA: Insufficient documentation

## 2024-04-12 DIAGNOSIS — E282 Polycystic ovarian syndrome: Secondary | ICD-10-CM | POA: Diagnosis not present

## 2024-04-12 DIAGNOSIS — Z79899 Other long term (current) drug therapy: Secondary | ICD-10-CM | POA: Insufficient documentation

## 2024-04-12 DIAGNOSIS — D509 Iron deficiency anemia, unspecified: Secondary | ICD-10-CM | POA: Insufficient documentation

## 2024-04-12 DIAGNOSIS — N920 Excessive and frequent menstruation with regular cycle: Secondary | ICD-10-CM | POA: Diagnosis not present

## 2024-04-12 DIAGNOSIS — Z8673 Personal history of transient ischemic attack (TIA), and cerebral infarction without residual deficits: Secondary | ICD-10-CM | POA: Insufficient documentation

## 2024-04-12 DIAGNOSIS — Z803 Family history of malignant neoplasm of breast: Secondary | ICD-10-CM | POA: Insufficient documentation

## 2024-04-12 LAB — IRON AND IRON BINDING CAPACITY (CC-WL,HP ONLY)
Iron: 54 ug/dL (ref 28–170)
Saturation Ratios: 14 % (ref 10.4–31.8)
TIBC: 384 ug/dL (ref 250–450)
UIBC: 330 ug/dL (ref 148–442)

## 2024-04-12 LAB — CBC WITH DIFFERENTIAL (CANCER CENTER ONLY)
Abs Immature Granulocytes: 0.05 10*3/uL (ref 0.00–0.07)
Basophils Absolute: 0.1 10*3/uL (ref 0.0–0.1)
Basophils Relative: 0 %
Eosinophils Absolute: 0.1 10*3/uL (ref 0.0–0.5)
Eosinophils Relative: 1 %
HCT: 35.6 % — ABNORMAL LOW (ref 36.0–46.0)
Hemoglobin: 11.7 g/dL — ABNORMAL LOW (ref 12.0–15.0)
Immature Granulocytes: 0 %
Lymphocytes Relative: 20 %
Lymphs Abs: 2.2 10*3/uL (ref 0.7–4.0)
MCH: 27.3 pg (ref 26.0–34.0)
MCHC: 32.9 g/dL (ref 30.0–36.0)
MCV: 83 fL (ref 80.0–100.0)
Monocytes Absolute: 0.7 10*3/uL (ref 0.1–1.0)
Monocytes Relative: 7 %
Neutro Abs: 8.1 10*3/uL — ABNORMAL HIGH (ref 1.7–7.7)
Neutrophils Relative %: 72 %
Platelet Count: 290 10*3/uL (ref 150–400)
RBC: 4.29 MIL/uL (ref 3.87–5.11)
RDW: 14.4 % (ref 11.5–15.5)
WBC Count: 11.3 10*3/uL — ABNORMAL HIGH (ref 4.0–10.5)
nRBC: 0 % (ref 0.0–0.2)

## 2024-04-12 LAB — CMP (CANCER CENTER ONLY)
ALT: 16 U/L (ref 0–44)
AST: 18 U/L (ref 15–41)
Albumin: 3.9 g/dL (ref 3.5–5.0)
Alkaline Phosphatase: 83 U/L (ref 38–126)
Anion gap: 6 (ref 5–15)
BUN: 9 mg/dL (ref 6–20)
CO2: 25 mmol/L (ref 22–32)
Calcium: 8.9 mg/dL (ref 8.9–10.3)
Chloride: 105 mmol/L (ref 98–111)
Creatinine: 0.72 mg/dL (ref 0.44–1.00)
GFR, Estimated: >60 mL/min (ref >60)
Glucose, Bld: 92 mg/dL (ref 70–99)
Potassium: 3.7 mmol/L (ref 3.5–5.1)
Sodium: 136 mmol/L (ref 135–145)
Total Bilirubin: 0.3 mg/dL (ref 0.0–1.2)
Total Protein: 7.1 g/dL (ref 6.5–8.1)

## 2024-04-12 LAB — PROTIME-INR
INR: 1.5 — ABNORMAL HIGH (ref 0.8–1.2)
Prothrombin Time: 17.9 s — ABNORMAL HIGH (ref 11.4–15.2)

## 2024-04-12 NOTE — Progress Notes (Unsigned)
 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   08/2023: Labs from rheumatology showed cardiolipin IgM >112, beta-2  glycoprotein IgM >112   01/06/2024-01/08/2024: Admitted for sudden onset vision loss in right eye. She was administered TNK for possible CRAO. Repeat cardiolipin IgM 40. Beta-2  glycoprotein IgM >150. Initiated therapy with aspirin  81 mg and Lovenox  bridge to Coumadin  with goal INR 2-3.   01/12/2024: Establish care with [facility] Hematology  CHIEF COMPLAINTS/PURPOSE OF CONSULTATION:  Central retinal artery occlusion of right eye.  Antiphospholipid syndrome.   HISTORY OF PRESENTING ILLNESS:  [name] Costa 43 y.o. female returns for a follow up for continued managemend of iron deficiency anemia, antiphospholipid syndrome and CRAO of right eye. She is unaccompanied for this visit.   On exam today, [name] Costa reports she is tolerating coumadin  therapy which is managed by her PCP. She is currently taking 7.5 mg daily with no issues. She denies easy bruising or overt bleeding except for her heavy menstrual bleeding. She reports that her monthly cycles can last 7-14 days with 3-4 days of heavy bleeding. She is not taking iron supplementation due to constipation. She reports her energy and appetite are fairly stable. She denies nausea , vomiting or bowel habit changes. She denies fever, chills, sweats, shortness of breath, chest pain or cough.  Rest of the ROS is below.   MEDICAL HISTORY:  Past Medical History:  Diagnosis Date   Bladder infection    Chest wall pain    PCOS (polycystic ovarian syndrome)    Stroke (HCC) 06/04/2021   Trichomonas     SURGICAL HISTORY: Past Surgical History:  Procedure Laterality Date   BREAST BIOPSY Right 05/25/2023   US  RT  BREAST BX W LOC DEV 1ST LESION IMG BX SPEC US  GUIDE 05/25/2023 [facility] MAMMOGRAPHY   BUBBLE STUDY  06/06/2021   Procedure: BUBBLE STUDY;  Surgeon: Wendie Hamburg, MD;  Location: [facility] ENDOSCOPY;  Service: Cardiovascular;;   CHOLECYSTECTOMY     CHOLECYSTECTOMY     COLONOSCOPY  2006   TEE WITHOUT CARDIOVERSION N/A 06/06/2021   Procedure: TRANSESOPHAGEAL ECHOCARDIOGRAM (TEE);  Surgeon: Wendie Hamburg, MD;  Location: [facility] ENDOSCOPY;  Service: Cardiovascular;  Laterality: N/A;   TUBAL LIGATION Bilateral 04/13/2013   Procedure: POST PARTUM TUBAL LIGATION;  Surgeon: Romilda Coaster, MD;  Location: [facility] ORS;  Service: Gynecology;  Laterality: Bilateral;    SOCIAL HISTORY: Social History   Socioeconomic History   Marital status: Divorced    Spouse name: Not on file   Number of children: Not on file   Years of education: Not on file   Highest education level: Not on file  Occupational History   Not on file  Tobacco Use   Smoking status: Every Day    Types: Cigarettes    Passive exposure: Never   Smokeless tobacco: Never   Tobacco comments:    5-6 cigarettes daily. Smoked Off and on since a teenager  Vaping Use   Vaping status: Former  Substance and Sexual Activity   Alcohol use: Not Currently   Drug use: Yes    Types: Marijuana    Comment: occ   Sexual activity: Not on file  Other Topics Concern   Not on file  Social History Narrative   Not on file   Social Drivers of Health  Financial Resource Strain: Not on file  Food Insecurity: No Food Insecurity (01/07/2024)   Hunger Vital Sign    Worried About Running Out of Food in the Last Year: Never true    Ran Out of Food in the Last Year: Never true  Transportation Needs: No Transportation Needs (01/07/2024)   PRAPARE - Administrator, Civil Service (Medical): No    Lack of Transportation (Non-Medical): No  Physical Activity: Not on file  Stress: Not on file  Social Connections: Not on file  Intimate Partner  Violence: Not At Risk (01/07/2024)   Humiliation, Afraid, Rape, and Kick questionnaire    Fear of Current or Ex-Partner: No    Emotionally Abused: No    Physically Abused: No    Sexually Abused: No    FAMILY HISTORY: Family History  Problem Relation Age of Onset   Hypertension Mother    Hypertension Father    Hypertension Sister    Hypertension Brother    Breast cancer Maternal Grandmother    Healthy Daughter    Healthy Son    Autism Son     ALLERGIES:  is allergic to latex.  MEDICATIONS:  Current Outpatient Medications  Medication Sig Dispense Refill   amLODipine  (NORVASC ) 5 MG tablet Take 1 tablet (5 mg total) by mouth daily. 30 tablet 11   aspirin  81 MG chewable tablet Chew 81 mg by mouth daily.     atorvastatin  (LIPITOR) 40 MG tablet Take 1 tablet by mouth once daily 30 tablet 5   calcium  carbonate (SUPER CALCIUM ) 1500 (600 Ca) MG TABS tablet Take 1,500 mg by mouth daily.     escitalopram  (LEXAPRO ) 10 MG tablet Take 10 mg by mouth daily.     hydroxychloroquine  (PLAQUENIL ) 200 MG tablet Take 1 tablet (200 mg total) by mouth 2 (two) times daily. 60 tablet 2   Multiple Vitamin (MULTIVITAMIN PO) Take 1 tablet by mouth daily. With iron     VITAMIN D  PO Take 1 tablet by mouth daily.     warfarin (COUMADIN ) 7.5 MG tablet Take 7.5 mg by mouth daily.     enoxaparin  (LOVENOX ) 100 MG/ML injection Inject 1 mL (100 mg total) into the skin every 12 (twelve) hours for 7 days. (Patient not taking: Reported on 01/20/2024) 14 mL 0   warfarin (COUMADIN ) 5 MG tablet Take 1.5 tablets (7.5 mg total) by mouth daily at 4 PM for 3 days, THEN 1 tablet (5 mg total) daily at 4 PM for 27 days. 32 tablet 0   No current facility-administered medications for this visit.    REVIEW OF SYSTEMS:   Constitutional: ( - ) fevers, ( - )  chills , ( - ) night sweats Eyes: ( - ) blurriness of vision, ( - ) double vision, ( - ) watery eyes Ears, nose, mouth, throat, and face: ( - ) mucositis, ( - ) sore  throat Respiratory: ( - ) cough, ( - ) dyspnea, ( - ) wheezes Cardiovascular: ( - ) palpitation, ( - ) chest discomfort, ( - ) lower extremity swelling Gastrointestinal:  ( - ) nausea, ( - ) heartburn, ( - ) change in bowel habits Skin: ( - ) abnormal skin rashes Lymphatics: ( - ) new lymphadenopathy, ( - ) easy bruising Neurological: ( - ) numbness, ( - ) tingling, ( - ) new weaknesses Behavioral/Psych: ( - ) mood change, ( - ) new changes  All other systems were reviewed with the patient and are negative.  PHYSICAL EXAMINATION: ECOG PERFORMANCE STATUS: 1 - Symptomatic but completely ambulatory  Vitals:   04/12/24 1524  BP: (!) 144/92  Pulse: 79  Resp: 14  Temp: (!) 97.5 F (36.4 C)  SpO2: 100%   Filed Weights   04/12/24 1524  Weight: 236 lb 1.6 oz (107.1 kg)    GENERAL: well appearing female in NAD  SKIN: skin color, texture, turgor are normal, no rashes or significant lesions EYES: conjunctiva are pink and non-injected, sclera clear LUNGS: clear to auscultation and percussion with normal breathing effort HEART: regular rate & rhythm and no murmurs and no lower extremity edema Musculoskeletal: no cyanosis of digits and no clubbing  PSYCH: alert & oriented x 3, fluent speech NEURO: no focal motor/sensory deficits  LABORATORY DATA:  I have reviewed the data as listed    Latest Ref Rng & Units 04/12/2024    2:55 PM 01/12/2024    3:17 PM 01/08/2024    4:09 AM  CBC  WBC 4.0 - 10.5 K/uL 11.3  9.8  9.3   Hemoglobin 12.0 - 15.0 g/dL 09.8  11.9  14.7   Hematocrit 36.0 - 46.0 % 35.6  40.8  36.9   Platelets 150 - 400 K/uL 290  275  261        Latest Ref Rng & Units 04/12/2024    2:55 PM 01/12/2024    3:17 PM 01/08/2024    4:09 AM  CMP  Glucose 70 - 99 mg/dL 92  80  829   BUN 6 - 20 mg/dL 9  7  9    Creatinine 0.44 - 1.00 mg/dL 5.62  1.30  8.65   Sodium 135 - 145 mmol/L 136  139  135   Potassium 3.5 - 5.1 mmol/L 3.7  3.5  3.5   Chloride 98 - 111 mmol/L 105  105  106   CO2  22 - 32 mmol/L 25  27  20    Calcium  8.9 - 10.3 mg/dL 8.9  9.1  8.5   Total Protein 6.5 - 8.1 g/dL 7.1  7.6    Total Bilirubin 0.0 - 1.2 mg/dL 0.3  0.3    Alkaline Phos 38 - 126 U/L 83  79    AST 15 - 41 U/L 18  44    ALT 0 - 44 U/L 16  33       RADIOGRAPHIC STUDIES: I have personally reviewed the radiological images as listed and agreed with the findings in the report. No results found.   ASSESSMENT & PLAN Tabbitha L Elie is a 43 y.o. female who presents to the hematology clinic for positive antiphospholipid syndrome with history of infarct at the right basal ganglia in July 2022 and  hospitalization for CRAO of right eye.   #Antiphospholipid syndrome #History of infarct of right basal ganglia #Central Retinal Artery Occlusion --Labs from 08/2023 showed cardiolipin IgM >112, beta-2  glycoprotein IgM >112  --Repeat labs from 01/08/2024 showed cardiolipin IgM 40. Beta-2  glycoprotein IgM >150.  --Currently on aspirin  81 mg, and warfarin 7.5 mg PO daily. --Labs today show WBC 11.3, Hgb 11.7, Plt 290. INR is not in therapeutic range, 1.5.  --No overt signs of bleeding or bruising except for monthly menstrual cycles.  --Patient will follow up with PCP today to modify warfarin dose so INR is 2-3.    # Iron Deficiency Anemia 2/2 to GYN Bleeding -- Findings are consistent with iron deficiency anemia secondary to patient's menorrhagia --Encouraged her to follow-up with OB/GYN for better control of her  menstrual cycles --Unable to tolerate PO iron due to constipation --Labs today show mild anemia with Hgb 11.7, MCV 83.0. Iron panel shows some deficiency with Iron 54, saturation 14%, ferritin 40 --No need for IV iron at this time.  --Continue to eat iron rich foods.    Follow up: --RTC in 3 months with repeat labs   No orders of the defined types were placed in this encounter.   All questions were answered. The patient knows to call the clinic with any problems, questions or  concerns.  I have spent a total of 25 minutes minutes of face-to-face and non-face-to-face time, preparing to see the patient, performing a medically appropriate examination, counseling and educating the patient, ordering medications/tests/procedures, referring and communicating with other health care professionals, documenting clinical information in the electronic health record, independently interpreting results and communicating results to the patient, and care coordination.   Wyline Hearing, PA-C Department of Hematology/Oncology [facility] Phone: [phone]

## 2024-04-13 LAB — FERRITIN: Ferritin: 40 ng/mL (ref 11–307)

## 2024-05-02 DIAGNOSIS — D6861 Antiphospholipid syndrome: Secondary | ICD-10-CM | POA: Diagnosis not present

## 2024-05-09 DIAGNOSIS — D6861 Antiphospholipid syndrome: Secondary | ICD-10-CM | POA: Diagnosis not present

## 2024-05-09 DIAGNOSIS — Z111 Encounter for screening for respiratory tuberculosis: Secondary | ICD-10-CM | POA: Diagnosis not present

## 2024-05-16 DIAGNOSIS — D6861 Antiphospholipid syndrome: Secondary | ICD-10-CM | POA: Diagnosis not present

## 2024-05-26 DIAGNOSIS — Z7182 Exercise counseling: Secondary | ICD-10-CM | POA: Diagnosis not present

## 2024-05-26 DIAGNOSIS — L609 Nail disorder, unspecified: Secondary | ICD-10-CM | POA: Diagnosis not present

## 2024-05-26 DIAGNOSIS — Z8673 Personal history of transient ischemic attack (TIA), and cerebral infarction without residual deficits: Secondary | ICD-10-CM | POA: Diagnosis not present

## 2024-05-26 DIAGNOSIS — Z713 Dietary counseling and surveillance: Secondary | ICD-10-CM | POA: Diagnosis not present

## 2024-05-26 DIAGNOSIS — D6861 Antiphospholipid syndrome: Secondary | ICD-10-CM | POA: Diagnosis not present

## 2024-05-26 DIAGNOSIS — I10 Essential (primary) hypertension: Secondary | ICD-10-CM | POA: Diagnosis not present

## 2024-05-26 DIAGNOSIS — E669 Obesity, unspecified: Secondary | ICD-10-CM | POA: Diagnosis not present

## 2024-06-15 NOTE — Progress Notes (Signed)
 Office Visit Note  Patient: [name] Costa             Date of Birth: 07-Mar-1981           MRN: [id number]             PCP: [facility] Referring: [facility]* Visit Date: 06/29/2024 Occupation: @GUAROCC @  Subjective:  Pain in her right ankle.   History of Present Illness: [name] Costa is a 43 y.o. female with antiphospholipid syndrome and recent episode of right eye vision loss in February 2025 due to central retinal artery occlusion.  She was placed on hydroxychloroquine  in February 2025 which she has been tolerating well.  She also has been on warfarin by Dr. Federico.  She continues to take enteric-coated aspirin  81 mg daily.  She has noticed increased bruising.  She has noticed prolonged and heavy menstruation.  She was evaluated by the PCP recently and was referred to a GYN.  Her shoulder and right elbow pain is better but she continues to have some discomfort in her ankle.  She states it is related to extra workload.    Activities of Daily Living:  Patient reports morning stiffness for 0 minutes.   Patient Denies nocturnal pain.  Difficulty dressing/grooming: Denies Difficulty climbing stairs: Reports Difficulty getting out of chair: Denies Difficulty using hands for taps, buttons, cutlery, and/or writing: Denies  Review of Systems  Constitutional:  Positive for fatigue.  HENT:  Negative for mouth sores and mouth dryness.   Eyes:  Negative for dryness.  Respiratory:  Negative for shortness of breath.   Cardiovascular:  Negative for chest pain and palpitations.  Gastrointestinal:  Negative for blood in stool, constipation and diarrhea.  Endocrine: Negative for increased urination.  Genitourinary:  Negative for involuntary urination.  Musculoskeletal:  Positive for joint pain and joint pain. Negative for gait problem, joint swelling, myalgias, muscle weakness, morning stiffness, muscle tenderness and myalgias.  Skin:  Negative for  color change, rash, hair loss and sensitivity to sunlight.  Allergic/Immunologic: Negative for susceptible to infections.  Neurological:  Negative for dizziness and headaches.  Hematological:  Negative for swollen glands.  Psychiatric/Behavioral:  Negative for depressed mood and sleep disturbance. The patient is not nervous/anxious.     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   TOBACCO ABUSE 10/15/2009   ALLERGIC RHINITIS 10/15/2009   ASTHMA 10/15/2009    Past Medical History:  Diagnosis Date   Bladder infection    Chest wall pain    PCOS (polycystic ovarian syndrome)    Stroke (HCC) 06/04/2021   Trichomonas     Family History  Problem Relation Age of Onset   Hypertension Mother    Hypertension Father    Hypertension Sister    Hypertension Brother    Multiple sclerosis Brother    Breast cancer Maternal Grandmother    Healthy Daughter    Autism Son    Past Surgical History:  Procedure Laterality Date   BREAST BIOPSY Right 05/25/2023   US  RT BREAST BX W LOC DEV 1ST LESION IMG BX SPEC US  GUIDE 05/25/2023 [facility] MAMMOGRAPHY   BUBBLE STUDY  06/06/2021   Procedure: BUBBLE STUDY;  Surgeon: Kate Lonni CROME, MD;  Location: [facility]  ENDOSCOPY;  Service: Cardiovascular;;   CHOLECYSTECTOMY     CHOLECYSTECTOMY     COLONOSCOPY  2006   TEE WITHOUT CARDIOVERSION N/A 06/06/2021   Procedure: TRANSESOPHAGEAL ECHOCARDIOGRAM (TEE);  Surgeon: Kate Lonni CROME, MD;  Location: [facility] ENDOSCOPY;  Service: Cardiovascular;  Laterality: N/A;   TUBAL LIGATION Bilateral 04/13/2013   Procedure: POST PARTUM TUBAL LIGATION;  Surgeon: Debby JULIANNA Lares, MD;  Location: [facility] ORS;  Service: Gynecology;  Laterality: Bilateral;   Social History   Social History  Narrative   Not on file   There is no immunization history for the selected administration types on file for this patient.   Objective: Vital Signs: BP (!) 143/85 (BP Location: Left Arm, Patient Position: Sitting, Cuff Size: Large)   Pulse 75   Resp 13   Ht 5' 10 (1.778 m)   Wt 239 lb 9.6 oz (108.7 kg)   BMI 34.38 kg/m    Physical Exam Vitals and nursing note reviewed.  Constitutional:      Appearance: She is well-developed.  HENT:     Head: Normocephalic and atraumatic.  Eyes:     Conjunctiva/sclera: Conjunctivae normal.  Cardiovascular:     Rate and Rhythm: Normal rate and regular rhythm.     Heart sounds: Normal heart sounds.  Pulmonary:     Effort: Pulmonary effort is normal.     Breath sounds: Normal breath sounds.  Abdominal:     General: Bowel sounds are normal.     Palpations: Abdomen is soft.  Musculoskeletal:     Cervical back: Normal range of motion.  Lymphadenopathy:     Cervical: No cervical adenopathy.  Skin:    General: Skin is warm and dry.     Capillary Refill: Capillary refill takes less than 2 seconds.  Neurological:     Mental Status: She is alert and oriented to person, place, and time.  Psychiatric:        Behavior: Behavior normal.      Musculoskeletal Exam: Cervical, thoracic and lumbar spine with good range of motion.  Shoulders, elbows, wrist joints, MCPs PIPs and DIPs with good range of motion with no synovitis.  Hip joints and knee joints in good range of motion without any warmth swelling or effusion.  There was no tenderness over her ankles or MTPs.  No synovitis was noted.  CDAI Exam: CDAI Score: -- Patient Global: --; Provider Global: -- Swollen: --; Tender: -- Joint Exam 06/29/2024   No joint exam has been documented for this visit   There is currently no information documented on the homunculus. Go to the Rheumatology activity and complete the homunculus joint exam.  Investigation: No additional findings.  Imaging: No  results found.  Recent Labs: Lab Results  Component Value Date   WBC 11.3 (H) 04/12/2024   HGB 11.7 (L) 04/12/2024   PLT 290 04/12/2024   NA 136 04/12/2024   K 3.7 04/12/2024   CL 105 04/12/2024   CO2 25 04/12/2024   GLUCOSE 92 04/12/2024   BUN 9 04/12/2024   CREATININE 0.72 04/12/2024   BILITOT 0.3 04/12/2024   ALKPHOS 83 04/12/2024   AST 18 04/12/2024   ALT 16 04/12/2024   PROT 7.1 04/12/2024   ALBUMIN 3.9 04/12/2024   CALCIUM  8.9 04/12/2024    Speciality Comments: Plaquenil  02/25  Procedures:  No procedures performed Allergies: Latex   Assessment / Plan:     Visit Diagnoses: Antiphospholipid syndrome (HCC) - Anticardiolipin IgM, beta-2  GP 1 IgM positive, CVA 2022 and central  retinal artery occlusion February 2025: On warfarin.  Followed by hematology. -January 08, 2024 beta-2  GP 1 IgM> 150, anticardiolipin IgM 40, ANA negative, C3-C4 normal, dsDNA negative, vitamin D  33.02.  Patient has been taking hydroxychloroquine  200 mg twice daily, warfarin and aspirin .  She has not had any new episodes of venous or arterial thrombosis.  I will recheck autoimmune labs today.  Plan: ANA, Anti-DNA antibody, double-stranded, C3 and C4, Sedimentation rate, Beta-2  glycoprotein antibodies, Cardiolipin antibodies, IgG, IgM, IgA  High risk medication use - Plaquenil  200 mg p.o. twice daily.  She is tolerating the medication well.  She could not get the baseline eye examination due to not getting her insurance excepted by several of the practice.  She has an appointment scheduled in October 2025.  She had labs in May CBC and CMP was stable except for anemia.  I will check autoimmune labs today.  Information reimmunization was placed in the AVS.  Chronic anticoagulation - On warfarin to keep INR 2-3 prescribed by Dr. Federico.  On aspirin  81 mg p.o. daily.  She is followed by Dr. Federico and has an appointment coming up in August.  Patient gives history of increased vaginal bleeding.  Advised to discuss  with Dr. Federico and also schedule an appointment with GYN.  She states that GYN appointment is pending.  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  Right basal ganglia infarct.  Positive ANA (antinuclear antibody) - ANA low titer positive, ENA negative, complements normal.  Chronic left shoulder pain-resolved.  Chronic pain of both ankles-she complains of some discomfort in her right ankle which she relates to doing strenuous activity yesterday.  No warmth swelling or effusion was noted.  Primary hypertension-pressure was elevated at 143/85.  She was advised to monitor closely and follow-up with her PCP.  Vitamin D  deficiency-she takes vitamin D .  History of asthma  History of anemia-followed by her PCP and Dr. Federico.  Anxiety and depression  Smoker-association with smoking with increased coagulation was discussed.  Smoking cessation was discussed at length.  BMI 34.38-need for regular exercise was emphasized.  Family history of MS (multiple sclerosis)-Brother (recent diagnosis)  Orders: Orders Placed This Encounter  Procedures   ANA   Anti-DNA antibody, double-stranded   C3 and C4   Sedimentation rate   Beta-2  glycoprotein antibodies   Cardiolipin antibodies, IgG, IgM, IgA   No orders of the defined types were placed in this encounter.   Follow-Up Instructions: Return in about 4 months (around 10/30/2024) for APS.   Maya Nash, MD  Note - This record has been created using Animal nutritionist.  Chart creation errors have been sought, but may not always  have been located. Such creation errors do not reflect on  the standard of medical care.

## 2024-06-29 ENCOUNTER — Ambulatory Visit: Payer: Self-pay | Attending: Rheumatology | Admitting: Rheumatology

## 2024-06-29 ENCOUNTER — Encounter: Payer: Self-pay | Admitting: Rheumatology

## 2024-06-29 VITALS — BP 124/80 | HR 75 | Resp 13 | Ht 70.0 in | Wt 239.6 lb

## 2024-06-29 DIAGNOSIS — I10 Essential (primary) hypertension: Secondary | ICD-10-CM | POA: Diagnosis not present

## 2024-06-29 DIAGNOSIS — Z8673 Personal history of transient ischemic attack (TIA), and cerebral infarction without residual deficits: Secondary | ICD-10-CM | POA: Insufficient documentation

## 2024-06-29 DIAGNOSIS — Z862 Personal history of diseases of the blood and blood-forming organs and certain disorders involving the immune mechanism: Secondary | ICD-10-CM | POA: Diagnosis not present

## 2024-06-29 DIAGNOSIS — Z79899 Other long term (current) drug therapy: Secondary | ICD-10-CM | POA: Insufficient documentation

## 2024-06-29 DIAGNOSIS — Z6834 Body mass index (BMI) 34.0-34.9, adult: Secondary | ICD-10-CM | POA: Diagnosis not present

## 2024-06-29 DIAGNOSIS — M25512 Pain in left shoulder: Secondary | ICD-10-CM | POA: Insufficient documentation

## 2024-06-29 DIAGNOSIS — G8929 Other chronic pain: Secondary | ICD-10-CM | POA: Diagnosis not present

## 2024-06-29 DIAGNOSIS — R768 Other specified abnormal immunological findings in serum: Secondary | ICD-10-CM | POA: Diagnosis not present

## 2024-06-29 DIAGNOSIS — F419 Anxiety disorder, unspecified: Secondary | ICD-10-CM | POA: Insufficient documentation

## 2024-06-29 DIAGNOSIS — F17200 Nicotine dependence, unspecified, uncomplicated: Secondary | ICD-10-CM | POA: Diagnosis not present

## 2024-06-29 DIAGNOSIS — Z7901 Long term (current) use of anticoagulants: Secondary | ICD-10-CM | POA: Insufficient documentation

## 2024-06-29 DIAGNOSIS — M25572 Pain in left ankle and joints of left foot: Secondary | ICD-10-CM | POA: Diagnosis not present

## 2024-06-29 DIAGNOSIS — Z820 Family history of epilepsy and other diseases of the nervous system: Secondary | ICD-10-CM | POA: Insufficient documentation

## 2024-06-29 DIAGNOSIS — Z8709 Personal history of other diseases of the respiratory system: Secondary | ICD-10-CM | POA: Insufficient documentation

## 2024-06-29 DIAGNOSIS — D6861 Antiphospholipid syndrome: Secondary | ICD-10-CM | POA: Diagnosis not present

## 2024-06-29 DIAGNOSIS — F32A Depression, unspecified: Secondary | ICD-10-CM | POA: Diagnosis not present

## 2024-06-29 DIAGNOSIS — E559 Vitamin D deficiency, unspecified: Secondary | ICD-10-CM | POA: Insufficient documentation

## 2024-06-29 DIAGNOSIS — Z6833 Body mass index (BMI) 33.0-33.9, adult: Secondary | ICD-10-CM

## 2024-06-29 DIAGNOSIS — M25571 Pain in right ankle and joints of right foot: Secondary | ICD-10-CM | POA: Insufficient documentation

## 2024-06-29 NOTE — Patient Instructions (Signed)
 Vaccines You are taking a medication(s) that can suppress your immune system.  The following immunizations are recommended: Flu annually Covid-19  Td/Tdap (tetanus, diphtheria, pertussis) every 10 years Pneumonia (Prevnar 15 then Pneumovax 23 at least 1 year apart.  Alternatively, can take Prevnar 20 without needing additional dose) Shingrix: 2 doses from 4 weeks to 6 months apart  Please check with your PCP to make sure you are up to date.

## 2024-07-04 LAB — SEDIMENTATION RATE: Sed Rate: 22 mm/h — ABNORMAL HIGH (ref 0–20)

## 2024-07-04 LAB — BETA-2 GLYCOPROTEIN ANTIBODIES
Beta-2 Glyco 1 IgA: 25.9 U/mL — ABNORMAL HIGH (ref <20.0)
Beta-2 Glyco 1 IgM: >112 U/mL — ABNORMAL HIGH (ref <20.0)
Beta-2 Glyco I IgG: 23.5 U/mL — ABNORMAL HIGH (ref <20.0)

## 2024-07-04 LAB — ANTI-NUCLEAR AB-TITER (ANA TITER): ANA Titer 1: 1:40 {titer} — ABNORMAL HIGH

## 2024-07-04 LAB — ANTI-DNA ANTIBODY, DOUBLE-STRANDED: ds DNA Ab: 1 [IU]/mL

## 2024-07-04 LAB — CARDIOLIPIN ANTIBODIES, IGG, IGM, IGA
Anticardiolipin IgA: 28.4 [APL'U]/mL — ABNORMAL HIGH (ref <20.0)
Anticardiolipin IgG: 18 [GPL'U]/mL (ref <20.0)
Anticardiolipin IgM: >112 [MPL'U]/mL — ABNORMAL HIGH (ref <20.0)

## 2024-07-04 LAB — C3 AND C4
C3 Complement: 141 mg/dL (ref 83–193)
C4 Complement: 19 mg/dL (ref 15–57)

## 2024-07-04 LAB — ANA: Anti Nuclear Antibody (ANA): POSITIVE — AB

## 2024-07-05 ENCOUNTER — Ambulatory Visit: Payer: Self-pay | Admitting: Rheumatology

## 2024-07-05 NOTE — Progress Notes (Signed)
 Sedimentation rate is mildly elevated, beta-2  GP 1 antibody and anticardiolipin antibodies remain positive at high titers.ANA is low titer positive, double-stranded DNA negative, complements normal, no change in treatment advised.  Please forward results to hematology

## 2024-07-07 DIAGNOSIS — D6861 Antiphospholipid syndrome: Secondary | ICD-10-CM | POA: Diagnosis not present

## 2024-07-14 ENCOUNTER — Inpatient Hospital Stay (HOSPITAL_BASED_OUTPATIENT_CLINIC_OR_DEPARTMENT_OTHER): Admitting: Hematology and Oncology

## 2024-07-14 ENCOUNTER — Other Ambulatory Visit: Payer: Self-pay | Admitting: Hematology and Oncology

## 2024-07-14 ENCOUNTER — Inpatient Hospital Stay: Attending: Physician Assistant

## 2024-07-14 VITALS — BP 143/95 | HR 72 | Temp 97.9°F | Resp 18 | Ht 70.0 in | Wt 244.9 lb

## 2024-07-14 DIAGNOSIS — H3411 Central retinal artery occlusion, right eye: Secondary | ICD-10-CM | POA: Diagnosis not present

## 2024-07-14 DIAGNOSIS — Z8249 Family history of ischemic heart disease and other diseases of the circulatory system: Secondary | ICD-10-CM | POA: Diagnosis not present

## 2024-07-14 DIAGNOSIS — Z7901 Long term (current) use of anticoagulants: Secondary | ICD-10-CM | POA: Insufficient documentation

## 2024-07-14 DIAGNOSIS — Z8269 Family history of other diseases of the musculoskeletal system and connective tissue: Secondary | ICD-10-CM | POA: Diagnosis not present

## 2024-07-14 DIAGNOSIS — Z7982 Long term (current) use of aspirin: Secondary | ICD-10-CM | POA: Diagnosis not present

## 2024-07-14 DIAGNOSIS — Z8673 Personal history of transient ischemic attack (TIA), and cerebral infarction without residual deficits: Secondary | ICD-10-CM | POA: Insufficient documentation

## 2024-07-14 DIAGNOSIS — Z9851 Tubal ligation status: Secondary | ICD-10-CM | POA: Diagnosis not present

## 2024-07-14 DIAGNOSIS — H5461 Unqualified visual loss, right eye, normal vision left eye: Secondary | ICD-10-CM | POA: Diagnosis not present

## 2024-07-14 DIAGNOSIS — N939 Abnormal uterine and vaginal bleeding, unspecified: Secondary | ICD-10-CM | POA: Insufficient documentation

## 2024-07-14 DIAGNOSIS — D508 Other iron deficiency anemias: Secondary | ICD-10-CM | POA: Diagnosis not present

## 2024-07-14 DIAGNOSIS — Z9049 Acquired absence of other specified parts of digestive tract: Secondary | ICD-10-CM | POA: Insufficient documentation

## 2024-07-14 DIAGNOSIS — F17210 Nicotine dependence, cigarettes, uncomplicated: Secondary | ICD-10-CM | POA: Diagnosis not present

## 2024-07-14 DIAGNOSIS — D638 Anemia in other chronic diseases classified elsewhere: Secondary | ICD-10-CM | POA: Diagnosis not present

## 2024-07-14 DIAGNOSIS — D6861 Antiphospholipid syndrome: Secondary | ICD-10-CM

## 2024-07-14 DIAGNOSIS — K5900 Constipation, unspecified: Secondary | ICD-10-CM | POA: Insufficient documentation

## 2024-07-14 DIAGNOSIS — Z803 Family history of malignant neoplasm of breast: Secondary | ICD-10-CM | POA: Diagnosis not present

## 2024-07-14 DIAGNOSIS — Z79899 Other long term (current) drug therapy: Secondary | ICD-10-CM | POA: Diagnosis not present

## 2024-07-14 LAB — CBC WITH DIFFERENTIAL (CANCER CENTER ONLY)
Abs Immature Granulocytes: 0.05 K/uL (ref 0.00–0.07)
Basophils Absolute: 0 K/uL (ref 0.0–0.1)
Basophils Relative: 0 %
Eosinophils Absolute: 0.1 K/uL (ref 0.0–0.5)
Eosinophils Relative: 1 %
HCT: 32.9 % — ABNORMAL LOW (ref 36.0–46.0)
Hemoglobin: 10.6 g/dL — ABNORMAL LOW (ref 12.0–15.0)
Immature Granulocytes: 1 %
Lymphocytes Relative: 20 %
Lymphs Abs: 2 K/uL (ref 0.7–4.0)
MCH: 27.2 pg (ref 26.0–34.0)
MCHC: 32.2 g/dL (ref 30.0–36.0)
MCV: 84.4 fL (ref 80.0–100.0)
Monocytes Absolute: 0.6 K/uL (ref 0.1–1.0)
Monocytes Relative: 6 %
Neutro Abs: 7.2 K/uL (ref 1.7–7.7)
Neutrophils Relative %: 72 %
Platelet Count: 302 K/uL (ref 150–400)
RBC: 3.9 MIL/uL (ref 3.87–5.11)
RDW: 14.9 % (ref 11.5–15.5)
WBC Count: 9.9 K/uL (ref 4.0–10.5)
nRBC: 0 % (ref 0.0–0.2)

## 2024-07-14 LAB — CMP (CANCER CENTER ONLY)
ALT: 11 U/L (ref 0–44)
AST: 16 U/L (ref 15–41)
Albumin: 3.9 g/dL (ref 3.5–5.0)
Alkaline Phosphatase: 78 U/L (ref 38–126)
Anion gap: 5 (ref 5–15)
BUN: 6 mg/dL (ref 6–20)
CO2: 28 mmol/L (ref 22–32)
Calcium: 8.9 mg/dL (ref 8.9–10.3)
Chloride: 105 mmol/L (ref 98–111)
Creatinine: 0.61 mg/dL (ref 0.44–1.00)
GFR, Estimated: >60 mL/min (ref >60)
Glucose, Bld: 89 mg/dL (ref 70–99)
Potassium: 4 mmol/L (ref 3.5–5.1)
Sodium: 138 mmol/L (ref 135–145)
Total Bilirubin: 0.2 mg/dL (ref 0.0–1.2)
Total Protein: 6.7 g/dL (ref 6.5–8.1)

## 2024-07-14 NOTE — Progress Notes (Signed)
 [facility] Telephone:([phone]) [phone]   Fax:([phone]) [phone]  PROGRESS NOTE  Patient Care Team: [facility] as PCP - General  Hematological/Oncological History  05/2021: Infarction of the right basal ganglia with left sided weakness.   08/2023: Labs from rheumatology showed cardiolipin IgM >112, beta-2  glycoprotein IgM >112   01/06/2024-01/08/2024: Admitted for sudden onset vision loss in right eye. She was administered TNK for possible CRAO. Repeat cardiolipin IgM 40. Beta-2  glycoprotein IgM >150. Initiated therapy with aspirin  81 mg and Lovenox  bridge to Coumadin  with goal INR 2-3.   01/12/2024: Establish care with [facility]ology  CHIEF COMPLAINTS/PURPOSE OF CONSULTATION:  Central retinal artery occlusion of right eye.  Antiphospholipid syndrome.   HISTORY OF PRESENTING ILLNESS:  [name] Costa 43 y.o. female returns for a follow up for continued managemend of iron deficiency anemia, antiphospholipid syndrome and CRAO of right eye. She is unaccompanied for this visit.   On exam today, Ms. Shaul reports she has been feeling well overall in interim since her last visit.  She reports her energy level today about an 8 out of 10.  She notes that she has had no issues with fevers, chills, sweats, nausea, vomiting or diarrhea recently.  She reports that she is tolerating her Coumadin  therapy well and reports that she was taking 10 mg p.o. daily but recently had to drop her dose.  She has been having some issues with vaginal bleeding which has been nonstop since [place].  She reports she is not currently on any medications for menstrual cycles as currently looking for an OB/GYN physician.  She notes that she is not having any nosebleeding, gum bleeding, or blood in the urine/stool.  She is also taking 81 mg of aspirin  p.o. daily.  She reports that she does her best to try to eat iron rich foods.  She reports that she does enjoy eating green leafy vegetables  but has been avoiding it due to her Coumadin  therapy.  She is not having any lightheadedness, dizziness, shortness of breath.  Overall she has been feeling well.  Full 10 point ROS is otherwise negative.  She is willing and able to continue on Coumadin  therapy at this time.  MEDICAL HISTORY:  Past Medical History:  Diagnosis Date   Bladder infection    Chest wall pain    PCOS (polycystic ovarian syndrome)    Stroke (HCC) 06/04/2021   Trichomonas     SURGICAL HISTORY: Past Surgical History:  Procedure Laterality Date   BREAST BIOPSY Right 05/25/2023   US  RT BREAST BX W LOC DEV 1ST LESION IMG BX SPEC US  GUIDE 05/25/2023 [facility] MAMMOGRAPHY   BUBBLE STUDY  06/06/2021   Procedure: BUBBLE STUDY;  Surgeon: Kate Lonni CROME, MD;  Location: [facility] ENDOSCOPY;  Service: Cardiovascular;;   CHOLECYSTECTOMY     CHOLECYSTECTOMY     COLONOSCOPY  2006   TEE WITHOUT CARDIOVERSION N/A 06/06/2021   Procedure: TRANSESOPHAGEAL ECHOCARDIOGRAM (TEE);  Surgeon: Kate Lonni CROME, MD;  Location: [facility] ENDOSCOPY;  Service: Cardiovascular;  Laterality: N/A;   TUBAL LIGATION Bilateral 04/13/2013   Procedure: POST PARTUM TUBAL LIGATION;  Surgeon: Debby JULIANNA Lares, MD;  Location: [facility] ORS;  Service: Gynecology;  Laterality: Bilateral;    SOCIAL HISTORY: Social History   Socioeconomic History   Marital status: Divorced    Spouse name: Not on file   Number of children: Not on file   Years of education: Not on file   Highest education level: Not on file  Occupational History   Not on file  Tobacco Use   Smoking status: Every Day    Types: Cigarettes    Passive exposure: Never   Smokeless tobacco: Never   Tobacco comments:    5-6 cigarettes daily. Smoked Off and on since a teenager  Vaping Use   Vaping status: Former  Substance and Sexual Activity   Alcohol use: Not Currently   Drug use: Not Currently    Types: Marijuana   Sexual activity: Not on file  Other Topics Concern   Not on file  Social  History Narrative   Not on file   Social Drivers of Health   Financial Resource Strain: Not on file  Food Insecurity: No Food Insecurity (01/07/2024)   Hunger Vital Sign    Worried About Running Out of Food in the Last Year: Never true    Ran Out of Food in the Last Year: Never true  Transportation Needs: No Transportation Needs (01/07/2024)   PRAPARE - Administrator, Civil Service (Medical): No    Lack of Transportation (Non-Medical): No  Physical Activity: Not on file  Stress: Not on file  Social Connections: Not on file  Intimate Partner Violence: Not At Risk (01/07/2024)   Humiliation, Afraid, Rape, and Kick questionnaire    Fear of Current or Ex-Partner: No    Emotionally Abused: No    Physically Abused: No    Sexually Abused: No    FAMILY HISTORY: Family History  Problem Relation Age of Onset   Hypertension Mother    Hypertension Father    Hypertension Sister    Hypertension Brother    Multiple sclerosis Brother    Breast cancer Maternal Grandmother    Healthy Daughter    Autism Son     ALLERGIES:  is allergic to latex.  MEDICATIONS:  Current Outpatient Medications  Medication Sig Dispense Refill   amLODipine  (NORVASC ) 5 MG tablet Take 1 tablet (5 mg total) by mouth daily. 30 tablet 11   aspirin  81 MG chewable tablet Chew 81 mg by mouth daily.     atorvastatin  (LIPITOR) 40 MG tablet Take 1 tablet by mouth once daily 30 tablet 5   calcium  carbonate (SUPER CALCIUM ) 1500 (600 Ca) MG TABS tablet Take 1,500 mg by mouth daily.     escitalopram  (LEXAPRO ) 10 MG tablet Take 10 mg by mouth daily.     hydroxychloroquine  (PLAQUENIL ) 200 MG tablet Take 1 tablet (200 mg total) by mouth 2 (two) times daily. 60 tablet 2   Multiple Vitamin (MULTIVITAMIN PO) Take 1 tablet by mouth daily. With iron     VITAMIN D  PO Take 1 tablet by mouth daily.     warfarin (COUMADIN ) 7.5 MG tablet Take 7.5 mg by mouth daily.     No current facility-administered medications for this  visit.    REVIEW OF SYSTEMS:   Constitutional: ( - ) fevers, ( - )  chills , ( - ) night sweats Eyes: ( - ) blurriness of vision, ( - ) double vision, ( - ) watery eyes Ears, nose, mouth, throat, and face: ( - ) mucositis, ( - ) sore throat Respiratory: ( - ) cough, ( - ) dyspnea, ( - ) wheezes Cardiovascular: ( - ) palpitation, ( - ) chest discomfort, ( - ) lower extremity swelling Gastrointestinal:  ( - ) nausea, ( - ) heartburn, ( - ) change in bowel habits Skin: ( - ) abnormal skin rashes Lymphatics: ( - ) new lymphadenopathy, ( - )  easy bruising Neurological: ( - ) numbness, ( - ) tingling, ( - ) new weaknesses Behavioral/Psych: ( - ) mood change, ( - ) new changes  All other systems were reviewed with the patient and are negative.  PHYSICAL EXAMINATION: ECOG PERFORMANCE STATUS: 1 - Symptomatic but completely ambulatory  Vitals:   07/14/24 1049  BP: (!) 143/95  Pulse: 72  Resp: 18  Temp: 97.9 F (36.6 C)  SpO2: 100%    Filed Weights   07/14/24 1049  Weight: 244 lb 14.4 oz (111.1 kg)     GENERAL: well appearing female in NAD  SKIN: skin color, texture, turgor are normal, no rashes or significant lesions EYES: conjunctiva are pink and non-injected, sclera clear LUNGS: clear to auscultation and percussion with normal breathing effort HEART: regular rate & rhythm and no murmurs and no lower extremity edema Musculoskeletal: no cyanosis of digits and no clubbing  PSYCH: alert & oriented x 3, fluent speech NEURO: no focal motor/sensory deficits  LABORATORY DATA:  I have reviewed the data as listed    Latest Ref Rng & Units 07/14/2024   10:33 AM 04/12/2024    2:55 PM 01/12/2024    3:17 PM  CBC  WBC 4.0 - 10.5 K/uL 9.9  11.3  9.8   Hemoglobin 12.0 - 15.0 g/dL 89.3  88.2  87.0   Hematocrit 36.0 - 46.0 % 32.9  35.6  40.8   Platelets 150 - 400 K/uL 302  290  275        Latest Ref Rng & Units 07/14/2024   10:33 AM 04/12/2024    2:55 PM 01/12/2024    3:17 PM  CMP   Glucose 70 - 99 mg/dL 89  92  80   BUN 6 - 20 mg/dL 6  9  7    Creatinine 0.44 - 1.00 mg/dL 9.38  9.27  9.34   Sodium 135 - 145 mmol/L 138  136  139   Potassium 3.5 - 5.1 mmol/L 4.0  3.7  3.5   Chloride 98 - 111 mmol/L 105  105  105   CO2 22 - 32 mmol/L 28  25  27    Calcium  8.9 - 10.3 mg/dL 8.9  8.9  9.1   Total Protein 6.5 - 8.1 g/dL 6.7  7.1  7.6   Total Bilirubin 0.0 - 1.2 mg/dL 0.2  0.3  0.3   Alkaline Phos 38 - 126 U/L 78  83  79   AST 15 - 41 U/L 16  18  44   ALT 0 - 44 U/L 11  16  33      RADIOGRAPHIC STUDIES: I have personally reviewed the radiological images as listed and agreed with the findings in the report. No results found.   ASSESSMENT & PLAN Kenadi L Sane is a 43 y.o. female who presents to the hematology clinic for positive antiphospholipid syndrome with history of infarct at the right basal ganglia in July 2022 and  hospitalization for CRAO of right eye.   #Antiphospholipid syndrome #History of infarct of right basal ganglia #Central Retinal Artery Occlusion --Labs from 08/2023 showed cardiolipin IgM >112, beta-2  glycoprotein IgM >112  --Repeat labs from 01/08/2024 showed cardiolipin IgM 40. Beta-2  glycoprotein IgM >150.  --Currently on aspirin  81 mg, and warfarin 7.5 mg PO daily. --Labs today show WBC 9.9, Hgb 10.6, MCV 84.4, Plt 302  --No overt signs of bleeding or bruising except for monthly menstrual cycles.  --Patient will follow up with PCP to maintain warfarin dose  so INR is 2-3.   # Iron Deficiency Anemia 2/2 to GYN Bleeding #Anemia of Chronic Disease  -- Findings are consistent with iron deficiency anemia secondary to menstrual bleeding and chronic disease.  --Unable to tolerate PO iron due to constipation --Labs today show mild anemia with Hgb 10.6, MCV 83.0. Prior iron panel shows Iron 54, saturation 14%, ferritin 40 --No need for IV iron at this time.  --Continue to eat iron rich foods.   Follow up: --RTC in 3 months with repeat labs   No  orders of the defined types were placed in this encounter.   All questions were answered. The patient knows to call the clinic with any problems, questions or concerns.  I have spent a total of 25 minutes minutes of face-to-face and non-face-to-face time, preparing to see the patient, performing a medically appropriate examination, counseling and educating the patient, ordering medications/tests/procedures, referring and communicating with other health care professionals, documenting clinical information in the electronic health record, independently interpreting results and communicating results to the patient, and care coordination.   Norleen IVAR Kidney, MD Department of Hematology/Oncology [facility] Phone: [phone] Pager: [phone] Email: [email or web address]

## 2024-07-19 DIAGNOSIS — F4310 Post-traumatic stress disorder, unspecified: Secondary | ICD-10-CM | POA: Diagnosis not present

## 2024-07-19 DIAGNOSIS — F329 Major depressive disorder, single episode, unspecified: Secondary | ICD-10-CM | POA: Diagnosis not present

## 2024-07-19 DIAGNOSIS — F411 Generalized anxiety disorder: Secondary | ICD-10-CM | POA: Diagnosis not present

## 2024-09-20 DIAGNOSIS — B079 Viral wart, unspecified: Secondary | ICD-10-CM | POA: Diagnosis not present

## 2024-09-27 DIAGNOSIS — B079 Viral wart, unspecified: Secondary | ICD-10-CM | POA: Diagnosis not present

## 2024-09-28 NOTE — Progress Notes (Deleted)
 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   History of Present Illness: [name] Costa is a 43 y.o. female ***     Activities of Daily Living:  Patient reports morning stiffness for *** {minute/hour:19697}.   Patient {ACTIONS;DENIES/REPORTS:21021675::Denies} nocturnal pain.  Difficulty dressing/grooming: {ACTIONS;DENIES/REPORTS:21021675::Denies} Difficulty climbing stairs: {ACTIONS;DENIES/REPORTS:21021675::Denies} Difficulty getting out of chair: {ACTIONS;DENIES/REPORTS:21021675::Denies} Difficulty using hands for taps, buttons, cutlery, and/or writing: {ACTIONS;DENIES/REPORTS:21021675::Denies}  No Rheumatology ROS completed.   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   TOBACCO ABUSE 10/15/2009   ALLERGIC RHINITIS 10/15/2009   ASTHMA 10/15/2009    Past Medical History:  Diagnosis Date   Bladder infection    Chest wall pain    PCOS (polycystic ovarian syndrome)    Stroke (HCC) 06/04/2021   Trichomonas     Family History  Problem Relation Age of Onset   Hypertension Mother    Hypertension Father    Hypertension Sister    Hypertension Brother    Multiple sclerosis Brother    Breast cancer Maternal Grandmother    Healthy Daughter    Autism Son    Past Surgical History:  Procedure Laterality Date   BREAST BIOPSY Right 05/25/2023   US  RT BREAST BX W LOC DEV 1ST LESION IMG BX SPEC US  GUIDE 05/25/2023 [facility] MAMMOGRAPHY   BUBBLE STUDY  06/06/2021   Procedure: BUBBLE  STUDY;  Surgeon: Kate Lonni CROME, MD;  Location: [facility] ENDOSCOPY;  Service: Cardiovascular;;   CHOLECYSTECTOMY     CHOLECYSTECTOMY     COLONOSCOPY  2006   TEE WITHOUT CARDIOVERSION N/A 06/06/2021   Procedure: TRANSESOPHAGEAL ECHOCARDIOGRAM (TEE);  Surgeon: Kate Lonni CROME, MD;  Location: [facility] ENDOSCOPY;  Service: Cardiovascular;  Laterality: N/A;   TUBAL LIGATION Bilateral 04/13/2013   Procedure: POST PARTUM TUBAL LIGATION;  Surgeon: Debby JULIANNA Lares, MD;  Location: [facility] ORS;  Service: Gynecology;  Laterality: Bilateral;   Social History   Tobacco Use   Smoking status: Every Day    Types: Cigarettes    Passive exposure: Never   Smokeless tobacco: Never   Tobacco comments:    5-6 cigarettes daily. Smoked Off and on since a teenager  Vaping Use   Vaping status: Former  Substance Use Topics   Alcohol use: Not Currently   Drug use: Not Currently    Types: Marijuana   Social History   Social History Narrative   Not on file     There is no immunization history for the selected administration types on file for this patient.   Objective: Vital Signs: There were no vitals taken for this visit.   Physical Exam   Musculoskeletal Exam: ***  CDAI Exam: CDAI Score: -- Patient Global: --; Provider Global: -- Swollen: --; Tender: -- Joint Exam 10/11/2024   No joint exam has been documented for this visit   There is currently no information documented on the homunculus. Go  to the Rheumatology activity and complete the homunculus joint exam.  Investigation: No additional findings.  Imaging: No results found.  Recent Labs: Lab Results  Component Value Date   WBC 9.9 07/14/2024   HGB 10.6 (L) 07/14/2024   PLT 302 07/14/2024   NA 138 07/14/2024   K 4.0 07/14/2024   CL 105 07/14/2024   CO2 28 07/14/2024   GLUCOSE 89 07/14/2024   BUN 6 07/14/2024   CREATININE 0.61 07/14/2024   BILITOT 0.2 07/14/2024   ALKPHOS 78 07/14/2024   AST 16 07/14/2024   ALT 11  07/14/2024   PROT 6.7 07/14/2024   ALBUMIN 3.9 07/14/2024   CALCIUM  8.9 07/14/2024    Speciality Comments: Plaquenil  02/25  Procedures:  No procedures performed Allergies: Latex   Assessment / Plan:     Visit Diagnoses: No diagnosis found.  Orders: No orders of the defined types were placed in this encounter.  No orders of the defined types were placed in this encounter.   Face-to-face time spent with patient was *** minutes. Greater than 50% of time was spent in counseling and coordination of care.  Follow-Up Instructions: No follow-ups on file.   Daved JAYSON Gavel, CMA  Note - This record has been created using Animal nutritionist.  Chart creation errors have been sought, but may not always  have been located. Such creation errors do not reflect on  the standard of medical care.

## 2024-09-29 ENCOUNTER — Encounter: Payer: Self-pay | Admitting: Neurology

## 2024-09-29 ENCOUNTER — Ambulatory Visit: Payer: 59 | Admitting: Neurology

## 2024-09-29 ENCOUNTER — Telehealth: Payer: Self-pay | Admitting: Neurology

## 2024-09-29 VITALS — BP 132/80 | HR 86 | Ht 70.0 in | Wt 251.2 lb

## 2024-09-29 DIAGNOSIS — Z8673 Personal history of transient ischemic attack (TIA), and cerebral infarction without residual deficits: Secondary | ICD-10-CM

## 2024-09-29 DIAGNOSIS — D6861 Antiphospholipid syndrome: Secondary | ICD-10-CM

## 2024-09-29 DIAGNOSIS — E669 Obesity, unspecified: Secondary | ICD-10-CM | POA: Diagnosis not present

## 2024-09-29 DIAGNOSIS — Z9189 Other specified personal risk factors, not elsewhere classified: Secondary | ICD-10-CM | POA: Diagnosis not present

## 2024-09-29 NOTE — Patient Instructions (Signed)
 II had a long d/w patient  about her recent episode of central retinal artery occlusion and remote cryptogenic stroke, diagnosis of anticardiolipin antibody syndrome, risk for recurrent stroke/TIAs, personally independently reviewed imaging studies and stroke evaluation results and answered questions.  continue aspirin  81 mg and warfarin with target INR between 2 and 3 daily  for secondary stroke prevention  and maintain strict control of hypertension with blood pressure goal below 130/90, diabetes with hemoglobin A1c goal below 6.5% and lipids with LDL cholesterol goal below 70 mg/dL.   .I also advised the patient to eat a healthy diet with plenty of whole grains, cereals, fruits and vegetables, exercise regularly and maintain ideal body weight .refer to sleep lab for polysomnogram for sleep apnea.  Followup in the future with me only as needed or call earlier if necessary

## 2024-09-29 NOTE — Progress Notes (Signed)
 [facility] [address] [place]. [place] [address] [phone]       OFFICE FOLLOW-UP NOTE  [name] Date of Birth:  10-Feb-1981 Medical Record Number:  [id number]   HPI: Last visit 09/17/2021: [name] Costa is a 43 year old African-American lady seen today for initial office follow-up visit following consultation for stroke in July 2022.  History is obtained from the patient and review of electronic medical records and I personally reviewed available imaging films in PACS.[name] Costa is a 43 y.o. left handed female with PMH significant for anxiety who went to bed at 2230 on 06/03/21 and woke up at 0430 on 06/04/21 and mouth felt weird and had trouble walking. Stumbled while walking to the bathroom and noted facial droop on the left. EMS called and she was brought in as a stroke code.  She was found to have significantly elevated systolic blood pressure greater than 200 on presentation and CT scan of the head without contrast was unremarkable but MRI scan showed mild diffusion restriction in the right lateral ventricle at striate distribution without any T2/flair correlate compatible with a large subcortical acute infarct.  2D echo showed normal ejection fraction without cardiac source of embolism.  LDL cholesterol was 105 mg percent.  Hemoglobin A1c was 5.4.  Urine drug screen was positive for marijuana.  TCD bubble study was negative for PFO however TEE was positive for small right to left shunt but did not show any other cardiac source of embolism.  Hypercoagulable panel lab was significant only for elevated IgM anticardiolipin antibody and titer of 108.  Patient denies any prior history of DVT, pulmonary embolism or recurrent miscarriages.  She was started on dual antiplatelet therapy aspirin  and Plavix  for 3 weeks to be followed by aspirin  alone.  Patient states she has done well since discharge.  Her left-sided weakness has completely recovered back to normal.  She  however still feels tired and has poor stamina.  She has been back to work.  She is tolerating aspirin  and Plavix  well with only minor bruising.  She is on Lipitor and tolerating well without muscle aches and pains.  She is not had any repeat labs yet.  She has no new complaints.  Blood pressures well controlled today it is 138/82. Update 02/05/2022 : She returns for follow-up after last visit 4-1/2 months ago.  He states he continues to do well.  He has had no recurrent stroke or TIA symptoms.  She is tolerating aspirin  well without bruising or bleeding.  She ran out of Lipitor a month ago but did not call for refill.  Blood pressure is well controlled at home.  He was tolerating Lipitor well without muscle aches or pains.  She is back to work full-time in fact.  She is working harder and longer hours and has more stress in her job now..  She complains of increased fatigability and tiredness. Update 09/29/2024 : She returns for follow-up after last visit with me 2-1/2 years ago.  I had ordered repeat anticardiolipin antibodies which was done on 09/17/2021 still suggested elevated IgM and a titer of 54 though lower than 108 from 3 months ago.  Patient was seen by rheumatologist Dr. Dolphus who also found elevated ANA titer and elevated IgG G glycoprotein and diagnosed anticardiolipin antibody syndrome.  Patient was admitted on 01/06/2024 for sudden onset of painless vision loss in the right eye that lasted about a minute and vision started getting blurred.  Vision was improving but  was still blurred at the time of arrival.  Code stroke was activated.  Patient vision was starting to improve but not back to baseline hence patient was given IV TNK after discussion of risk benefits and alternatives.  Patient did well and vision completely recovered back to normal.  CT angiogram of the head and neck showed no large vessel stenosis or occlusion.  Echocardiogram showed ejection fraction of 60 to 65%.  Left atrial size was  normal.  MRI scan of the brain was negative for any acute infarct.  LDL cholesterol 48 mg percent.  Hemoglobin A1c was 5.6.  Urine drug screen was positive for marijuana.  Patient was on aspirin  alone prior to admission and she was switched to aspirin  and Coumadin .  She subsequently saw her rheumatologist who also added Plaquenil .  Patient states she got better since then.  Her INR had been fluctuating a bit but for the last 1 month it has been fairly stable.  Blood pressure is under good control.  She knows she needs to lose weight and she is started eating healthy but not lost a lot.  Last lab work from 06/29/2024 still showed elevated ANA titer of 1 is to 40 with a nuclear speckled pattern and elevated IgA anticardiolipin antibody and titer of 28.4 and IgM greater than 112 ROS:   14 system review of systems is positive for tingling, numbness, tiredness and decreased stamina and all other systems negative  PMH:  Past Medical History:  Diagnosis Date   Antiphospholipid syndrome    Bladder infection    Chest wall pain    PCOS (polycystic ovarian syndrome)    Stroke (HCC) 06/04/2021   Trichomonas     Social History:  Social History   Socioeconomic History   Marital status: Divorced    Spouse name: Not on file   Number of children: Not on file   Years of education: Not on file   Highest education level: Not on file  Occupational History   Not on file  Tobacco Use   Smoking status: Every Day    Types: Cigarettes    Passive exposure: Never   Smokeless tobacco: Never   Tobacco comments:    5-6 cigarettes daily. Smoked Off and on since a teenager  Vaping Use   Vaping status: Former  Substance and Sexual Activity   Alcohol use: Not Currently   Drug use: Yes    Types: Marijuana    Comment: few times per week   Sexual activity: Not on file  Other Topics Concern   Not on file  Social History Narrative   Not on file   Social Drivers of Health   Financial Resource Strain: Not on  file  Food Insecurity: No Food Insecurity (01/07/2024)   Hunger Vital Sign    Worried About Running Out of Food in the Last Year: Never true    Ran Out of Food in the Last Year: Never true  Transportation Needs: No Transportation Needs (01/07/2024)   PRAPARE - Administrator, Civil Service (Medical): No    Lack of Transportation (Non-Medical): No  Physical Activity: Not on file  Stress: Not on file  Social Connections: Not on file  Intimate Partner Violence: Not At Risk (01/07/2024)   Humiliation, Afraid, Rape, and Kick questionnaire    Fear of Current or Ex-Partner: No    Emotionally Abused: No    Physically Abused: No    Sexually Abused: No    Medications:   Current  Outpatient Medications on File Prior to Visit  Medication Sig Dispense Refill   amLODipine  (NORVASC ) 5 MG tablet Take 1 tablet (5 mg total) by mouth daily. 30 tablet 11   aspirin  81 MG chewable tablet Chew 81 mg by mouth daily.     atorvastatin  (LIPITOR) 40 MG tablet Take 1 tablet by mouth once daily 30 tablet 5   escitalopram  (LEXAPRO ) 10 MG tablet Take 10 mg by mouth daily.     hydroxychloroquine  (PLAQUENIL ) 200 MG tablet Take 1 tablet (200 mg total) by mouth 2 (two) times daily. 60 tablet 2   Multiple Vitamin (MULTIVITAMIN PO) Take 1 tablet by mouth daily. With iron     VITAMIN D  PO Take 1 tablet by mouth daily.     warfarin (COUMADIN ) 7.5 MG tablet Take 7.5 mg by mouth daily.     No current facility-administered medications on file prior to visit.    Allergies:   Allergies  Allergen Reactions   Latex Rash    Physical Exam General: Mildly obese middle-aged African-American lady, seated, in no evident distress Head: head normocephalic and atraumatic.  Neck: supple with no carotid or supraclavicular bruits Cardiovascular: regular rate and rhythm, no murmurs Musculoskeletal: no deformity Skin:  no rash/petichiae Vascular:  Normal pulses all extremities Vitals:   09/29/24 1453  BP: 132/80  Pulse:  86  SpO2: 99%   Neurologic Exam Mental Status: Awake and fully alert. Oriented to place and time. Recent and remote memory intact. Attention span, concentration and fund of knowledge appropriate. Mood and affect appropriate.  Cranial Nerves: Fundoscopic exam  not done. Pupils equal, briskly reactive to light. Extraocular movements full without nystagmus. Visual fields full to confrontation. Hearing intact. Facial sensation intact. Face, tongue, palate moves normally and symmetrically.  Motor: Normal bulk and tone. Normal strength in all tested extremity muscles.  Diminished fine finger movements on the left.  Orbits right over left upper extremity.  Trace left grip weakness. Sensory.:  Subjective diminished touch pinprick left face.  Normal position vibration sensation. Coordination: Rapid alternating movements normal in all extremities. Finger-to-nose and heel-to-shin performed accurately bilaterally. Gait and Station: Arises from chair without difficulty. Stance is normal. Gait demonstrates normal stride length and balance . Able to heel, toe and tandem walk without difficulty.  Reflexes: 1+ and symmetric. Toes downgoing.       ASSESSMENT: 43 year old African-American lady with large right subcortical infarct of cryptogenic etiology in July 2022.  Vascular risk factors of hypertension, hyperlipidemia, mild obesity and small PFO noted on TEE but not on TCD bubble study and with a low ROPE score of 5 less likely to be significant.  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     PLAN: I had a long d/w patient  about her recent episode of central retinal artery occlusion and remote cryptogenic stroke, diagnosis of anticardiolipin antibody syndrome, risk for recurrent stroke/TIAs, personally independently reviewed imaging studies and stroke evaluation results and answered questions.  continue aspirin  81 mg and warfarin with target INR between  2 and 3 daily  for secondary stroke prevention  and maintain strict control of hypertension with blood pressure goal below 130/90, diabetes with hemoglobin A1c goal below 6.5% and lipids with LDL cholesterol goal below 70 mg/dL.   .I also advised the patient to eat a healthy diet with plenty of whole grains, cereals, fruits and vegetables, exercise regularly and maintain ideal body weight patient is planning to lose weight and I advised her not  to go on the weight loss reducing injection medications due to concerns about increased risk of ischemic optic neuropathy with those medicines.Refer to sleep lab for polysomnogram for sleep apnea.  Followup in the future with me only as needed or call earlier if necessary.   I personally spent a total of 35 minutes in the care of the patient today including getting/reviewing separately obtained history, performing a medically appropriate exam/evaluation, counseling and educating, placing orders, referring and communicating with other health care professionals, documenting clinical information in the EHR, independently interpreting results, and coordinating care.        Eather Popp, MD Note: This document was prepared with digital dictation and possible smart phrase technology. Any transcriptional errors that result from this process are unintentional

## 2024-09-29 NOTE — Telephone Encounter (Signed)
 Referral to Sleep  Studies  sent thru epic to [facility] sleep

## 2024-09-30 ENCOUNTER — Other Ambulatory Visit: Payer: Self-pay | Admitting: *Deleted

## 2024-09-30 ENCOUNTER — Inpatient Hospital Stay: Attending: Physician Assistant

## 2024-09-30 DIAGNOSIS — D6861 Antiphospholipid syndrome: Secondary | ICD-10-CM | POA: Diagnosis not present

## 2024-09-30 DIAGNOSIS — D509 Iron deficiency anemia, unspecified: Secondary | ICD-10-CM | POA: Insufficient documentation

## 2024-09-30 LAB — CBC WITH DIFFERENTIAL (CANCER CENTER ONLY)
Abs Immature Granulocytes: 0.07 K/uL (ref 0.00–0.07)
Basophils Absolute: 0 K/uL (ref 0.0–0.1)
Basophils Relative: 0 %
Eosinophils Absolute: 0.1 K/uL (ref 0.0–0.5)
Eosinophils Relative: 1 %
HCT: 32.5 % — ABNORMAL LOW (ref 36.0–46.0)
Hemoglobin: 10.5 g/dL — ABNORMAL LOW (ref 12.0–15.0)
Immature Granulocytes: 1 %
Lymphocytes Relative: 21 %
Lymphs Abs: 2.5 K/uL (ref 0.7–4.0)
MCH: 26.6 pg (ref 26.0–34.0)
MCHC: 32.3 g/dL (ref 30.0–36.0)
MCV: 82.5 fL (ref 80.0–100.0)
Monocytes Absolute: 0.8 K/uL (ref 0.1–1.0)
Monocytes Relative: 7 %
Neutro Abs: 8.3 K/uL — ABNORMAL HIGH (ref 1.7–7.7)
Neutrophils Relative %: 70 %
Platelet Count: 331 K/uL (ref 150–400)
RBC: 3.94 MIL/uL (ref 3.87–5.11)
RDW: 14.6 % (ref 11.5–15.5)
WBC Count: 11.8 K/uL — ABNORMAL HIGH (ref 4.0–10.5)
nRBC: 0 % (ref 0.0–0.2)

## 2024-09-30 LAB — CMP (CANCER CENTER ONLY)
ALT: 11 U/L (ref 0–44)
AST: 15 U/L (ref 15–41)
Albumin: 3.9 g/dL (ref 3.5–5.0)
Alkaline Phosphatase: 83 U/L (ref 38–126)
Anion gap: 6 (ref 5–15)
BUN: 10 mg/dL (ref 6–20)
CO2: 26 mmol/L (ref 22–32)
Calcium: 9 mg/dL (ref 8.9–10.3)
Chloride: 107 mmol/L (ref 98–111)
Creatinine: 0.74 mg/dL (ref 0.44–1.00)
GFR, Estimated: >60 mL/min (ref >60)
Glucose, Bld: 88 mg/dL (ref 70–99)
Potassium: 3.9 mmol/L (ref 3.5–5.1)
Sodium: 139 mmol/L (ref 135–145)
Total Bilirubin: 0.2 mg/dL (ref 0.0–1.2)
Total Protein: 7.1 g/dL (ref 6.5–8.1)

## 2024-10-07 ENCOUNTER — Other Ambulatory Visit: Payer: Self-pay | Admitting: Hematology and Oncology

## 2024-10-07 ENCOUNTER — Telehealth: Payer: Self-pay | Admitting: Hematology and Oncology

## 2024-10-07 ENCOUNTER — Inpatient Hospital Stay: Attending: Physician Assistant | Admitting: Hematology and Oncology

## 2024-10-07 ENCOUNTER — Inpatient Hospital Stay

## 2024-10-07 VITALS — BP 150/87 | HR 89 | Temp 97.9°F | Resp 16 | Wt 250.8 lb

## 2024-10-07 DIAGNOSIS — F17210 Nicotine dependence, cigarettes, uncomplicated: Secondary | ICD-10-CM | POA: Insufficient documentation

## 2024-10-07 DIAGNOSIS — H5461 Unqualified visual loss, right eye, normal vision left eye: Secondary | ICD-10-CM | POA: Diagnosis not present

## 2024-10-07 DIAGNOSIS — Z9851 Tubal ligation status: Secondary | ICD-10-CM | POA: Diagnosis not present

## 2024-10-07 DIAGNOSIS — Z7901 Long term (current) use of anticoagulants: Secondary | ICD-10-CM | POA: Insufficient documentation

## 2024-10-07 DIAGNOSIS — H3411 Central retinal artery occlusion, right eye: Secondary | ICD-10-CM | POA: Diagnosis not present

## 2024-10-07 DIAGNOSIS — Z8249 Family history of ischemic heart disease and other diseases of the circulatory system: Secondary | ICD-10-CM | POA: Diagnosis not present

## 2024-10-07 DIAGNOSIS — E282 Polycystic ovarian syndrome: Secondary | ICD-10-CM | POA: Insufficient documentation

## 2024-10-07 DIAGNOSIS — Z91040 Latex allergy status: Secondary | ICD-10-CM | POA: Diagnosis not present

## 2024-10-07 DIAGNOSIS — D508 Other iron deficiency anemias: Secondary | ICD-10-CM

## 2024-10-07 DIAGNOSIS — Z9049 Acquired absence of other specified parts of digestive tract: Secondary | ICD-10-CM | POA: Diagnosis not present

## 2024-10-07 DIAGNOSIS — Z803 Family history of malignant neoplasm of breast: Secondary | ICD-10-CM | POA: Insufficient documentation

## 2024-10-07 DIAGNOSIS — K5900 Constipation, unspecified: Secondary | ICD-10-CM | POA: Insufficient documentation

## 2024-10-07 DIAGNOSIS — Z820 Family history of epilepsy and other diseases of the nervous system: Secondary | ICD-10-CM | POA: Insufficient documentation

## 2024-10-07 DIAGNOSIS — F1290 Cannabis use, unspecified, uncomplicated: Secondary | ICD-10-CM | POA: Diagnosis not present

## 2024-10-07 DIAGNOSIS — Z8673 Personal history of transient ischemic attack (TIA), and cerebral infarction without residual deficits: Secondary | ICD-10-CM | POA: Diagnosis not present

## 2024-10-07 DIAGNOSIS — Z79899 Other long term (current) drug therapy: Secondary | ICD-10-CM | POA: Diagnosis not present

## 2024-10-07 DIAGNOSIS — D638 Anemia in other chronic diseases classified elsewhere: Secondary | ICD-10-CM | POA: Insufficient documentation

## 2024-10-07 DIAGNOSIS — Z7982 Long term (current) use of aspirin: Secondary | ICD-10-CM | POA: Diagnosis not present

## 2024-10-07 DIAGNOSIS — R531 Weakness: Secondary | ICD-10-CM | POA: Insufficient documentation

## 2024-10-07 DIAGNOSIS — D6861 Antiphospholipid syndrome: Secondary | ICD-10-CM | POA: Diagnosis not present

## 2024-10-07 LAB — RETIC PANEL
Immature Retic Fract: 22.1 % — ABNORMAL HIGH (ref 2.3–15.9)
RBC.: 4.26 MIL/uL (ref 3.87–5.11)
Retic Count, Absolute: 85.6 K/uL (ref 19.0–186.0)
Retic Ct Pct: 2 % (ref 0.4–3.1)
Reticulocyte Hemoglobin: 27.7 pg — ABNORMAL LOW (ref >27.9)

## 2024-10-07 LAB — IRON AND IRON BINDING CAPACITY (CC-WL,HP ONLY)
Iron: 37 ug/dL (ref 28–170)
Saturation Ratios: 8 % — ABNORMAL LOW (ref 10.4–31.8)
TIBC: 452 ug/dL — ABNORMAL HIGH (ref 250–450)
UIBC: 415 ug/dL (ref 148–442)

## 2024-10-07 LAB — CBC WITH DIFFERENTIAL (CANCER CENTER ONLY)
Abs Immature Granulocytes: 0.05 K/uL (ref 0.00–0.07)
Basophils Absolute: 0 K/uL (ref 0.0–0.1)
Basophils Relative: 0 %
Eosinophils Absolute: 0.1 K/uL (ref 0.0–0.5)
Eosinophils Relative: 1 %
HCT: 35 % — ABNORMAL LOW (ref 36.0–46.0)
Hemoglobin: 11.1 g/dL — ABNORMAL LOW (ref 12.0–15.0)
Immature Granulocytes: 0 %
Lymphocytes Relative: 19 %
Lymphs Abs: 2.3 K/uL (ref 0.7–4.0)
MCH: 26.2 pg (ref 26.0–34.0)
MCHC: 31.7 g/dL (ref 30.0–36.0)
MCV: 82.5 fL (ref 80.0–100.0)
Monocytes Absolute: 1 K/uL (ref 0.1–1.0)
Monocytes Relative: 8 %
Neutro Abs: 8.9 K/uL — ABNORMAL HIGH (ref 1.7–7.7)
Neutrophils Relative %: 72 %
Platelet Count: 360 K/uL (ref 150–400)
RBC: 4.24 MIL/uL (ref 3.87–5.11)
RDW: 14.7 % (ref 11.5–15.5)
WBC Count: 12.4 K/uL — ABNORMAL HIGH (ref 4.0–10.5)
nRBC: 0 % (ref 0.0–0.2)

## 2024-10-07 LAB — CMP (CANCER CENTER ONLY)
ALT: 12 U/L (ref 0–44)
AST: 16 U/L (ref 15–41)
Albumin: 4 g/dL (ref 3.5–5.0)
Alkaline Phosphatase: 85 U/L (ref 38–126)
Anion gap: 6 (ref 5–15)
BUN: 10 mg/dL (ref 6–20)
CO2: 28 mmol/L (ref 22–32)
Calcium: 9.1 mg/dL (ref 8.9–10.3)
Chloride: 105 mmol/L (ref 98–111)
Creatinine: 0.72 mg/dL (ref 0.44–1.00)
GFR, Estimated: >60 mL/min (ref >60)
Glucose, Bld: 87 mg/dL (ref 70–99)
Potassium: 3.8 mmol/L (ref 3.5–5.1)
Sodium: 139 mmol/L (ref 135–145)
Total Bilirubin: 0.2 mg/dL (ref 0.0–1.2)
Total Protein: 7.4 g/dL (ref 6.5–8.1)

## 2024-10-07 LAB — FERRITIN: Ferritin: 22 ng/mL (ref 11–307)

## 2024-10-07 NOTE — Progress Notes (Signed)
 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   08/2023: Labs from rheumatology showed cardiolipin IgM >112, beta-2  glycoprotein IgM >112   01/06/2024-01/08/2024: Admitted for sudden onset vision loss in right eye. She was administered TNK for possible CRAO. Repeat cardiolipin IgM 40. Beta-2  glycoprotein IgM >150. Initiated therapy with aspirin  81 mg and Lovenox  bridge to Coumadin  with goal INR 2-3.   01/12/2024: Establish care with [facility]  CHIEF COMPLAINTS/PURPOSE OF CONSULTATION:  Central retinal artery occlusion of right eye.  Antiphospholipid syndrome.   HISTORY OF PRESENTING ILLNESS:  [name] Costa 43 y.o. female returns for a follow up for continued managemend of iron deficiency anemia, antiphospholipid syndrome and CRAO of right eye. She is unaccompanied for this visit.   On exam today, [name] Costa reports she has been well overall in the room since our last visit.  She reports that she had a good time over Halloween with her son who is 11 years old and just a Spider-Man.  She reports that she is feeling really good.  She reports that she is not happy about her increasing weight and would like to decrease her weight down to about 200.  She reports that she is doing her best to try to decrease sodas and drink more water.  Also she is trying to cook more rather than eat out.  She reports her Coumadin  levels have been good typically within range of 2.0-3.0.  She reports that she is a vegetable person and eats veggies consistently on 1 day/week.  She reports that her menstrual bleeding has calm down.  She reports that she has had no further issues concerning for clots such as leg pain, leg swelling, chest pain, headaches, or vision changes.  She reports  that she is not currently taking an iron pill.  She is not having any issues with lightheadedness, dizziness, shortness of breath.  She denies any major changes in her health in the interim since our last visit.  A full 10 point ROS is otherwise negative.  MEDICAL HISTORY:  Past Medical History:  Diagnosis Date   Antiphospholipid syndrome    Bladder infection    Chest wall pain    PCOS (polycystic ovarian syndrome)    Stroke (HCC) 06/04/2021   Trichomonas     SURGICAL HISTORY: Past Surgical History:  Procedure Laterality Date   BREAST BIOPSY Right 05/25/2023   US  RT BREAST BX W LOC DEV 1ST LESION IMG BX SPEC US  GUIDE 05/25/2023 [facility] MAMMOGRAPHY   BUBBLE STUDY  06/06/2021   Procedure: BUBBLE STUDY;  Surgeon: Kate Lonni CROME, MD;  Location: [facility] ENDOSCOPY;  Service: Cardiovascular;;   CHOLECYSTECTOMY     CHOLECYSTECTOMY     COLONOSCOPY  2006   TEE WITHOUT CARDIOVERSION N/A 06/06/2021   Procedure: TRANSESOPHAGEAL ECHOCARDIOGRAM (TEE);  Surgeon: Kate Lonni CROME, MD;  Location: [facility] ENDOSCOPY;  Service: Cardiovascular;  Laterality: N/A;   TUBAL LIGATION Bilateral 04/13/2013   Procedure: POST PARTUM TUBAL LIGATION;  Surgeon: Debby JULIANNA Lares, MD;  Location: [facility] ORS;  Service: Gynecology;  Laterality: Bilateral;    SOCIAL HISTORY: Social History   Socioeconomic History   Marital status: Divorced    Spouse name: Not on file   Number of children: Not on file   Years of education: Not on file   Highest  education level: Not on file  Occupational History   Not on file  Tobacco Use   Smoking status: Every Day    Types: Cigarettes    Passive exposure: Never   Smokeless tobacco: Never   Tobacco comments:    5-6 cigarettes daily. Smoked Off and on since a teenager  Vaping Use   Vaping status: Former  Substance and Sexual Activity   Alcohol use: Not Currently   Drug use: Yes    Types: Marijuana    Comment: few times per week   Sexual activity: Not on file  Other  Topics Concern   Not on file  Social History Narrative   Not on file   Social Drivers of Health   Financial Resource Strain: Not on file  Food Insecurity: No Food Insecurity (01/07/2024)   Hunger Vital Sign    Worried About Running Out of Food in the Last Year: Never true    Ran Out of Food in the Last Year: Never true  Transportation Needs: No Transportation Needs (01/07/2024)   PRAPARE - Administrator, Civil Service (Medical): No    Lack of Transportation (Non-Medical): No  Physical Activity: Not on file  Stress: Not on file  Social Connections: Not on file  Intimate Partner Violence: Not At Risk (01/07/2024)   Humiliation, Afraid, Rape, and Kick questionnaire    Fear of Current or Ex-Partner: No    Emotionally Abused: No    Physically Abused: No    Sexually Abused: No    FAMILY HISTORY: Family History  Problem Relation Age of Onset   Hypertension Mother    Hypertension Father    Hypertension Sister    Hypertension Brother    Multiple sclerosis Brother    Breast cancer Maternal Grandmother    Healthy Daughter    Autism Son     ALLERGIES:  is allergic to latex.  MEDICATIONS:  Current Outpatient Medications  Medication Sig Dispense Refill   amLODipine  (NORVASC ) 5 MG tablet Take 1 tablet (5 mg total) by mouth daily. 30 tablet 11   aspirin  81 MG chewable tablet Chew 81 mg by mouth daily.     atorvastatin  (LIPITOR) 40 MG tablet Take 1 tablet by mouth once daily 30 tablet 5   escitalopram  (LEXAPRO ) 10 MG tablet Take 10 mg by mouth daily.     hydroxychloroquine  (PLAQUENIL ) 200 MG tablet Take 1 tablet (200 mg total) by mouth 2 (two) times daily. 60 tablet 2   Multiple Vitamin (MULTIVITAMIN PO) Take 1 tablet by mouth daily. With iron     VITAMIN D  PO Take 1 tablet by mouth daily.     warfarin (COUMADIN ) 7.5 MG tablet Take 7.5 mg by mouth daily.     No current facility-administered medications for this visit.    REVIEW OF SYSTEMS:   Constitutional: ( - )  fevers, ( - )  chills , ( - ) night sweats Eyes: ( - ) blurriness of vision, ( - ) double vision, ( - ) watery eyes Ears, nose, mouth, throat, and face: ( - ) mucositis, ( - ) sore throat Respiratory: ( - ) cough, ( - ) dyspnea, ( - ) wheezes Cardiovascular: ( - ) palpitation, ( - ) chest discomfort, ( - ) lower extremity swelling Gastrointestinal:  ( - ) nausea, ( - ) heartburn, ( - ) change in bowel habits Skin: ( - ) abnormal skin rashes Lymphatics: ( - ) new lymphadenopathy, ( - ) easy bruising Neurological: ( - )  numbness, ( - ) tingling, ( - ) new weaknesses Behavioral/Psych: ( - ) mood change, ( - ) new changes  All other systems were reviewed with the patient and are negative.  PHYSICAL EXAMINATION: ECOG PERFORMANCE STATUS: 1 - Symptomatic but completely ambulatory  Vitals:   10/07/24 1533  BP: (!) 150/87  Pulse: 89  Resp: 16  Temp: 97.9 F (36.6 C)  SpO2: 100%     Filed Weights   10/07/24 1533  Weight: 250 lb 12.8 oz (113.8 kg)      GENERAL: well appearing female in NAD  SKIN: skin color, texture, turgor are normal, no rashes or significant lesions EYES: conjunctiva are pink and non-injected, sclera clear LUNGS: clear to auscultation and percussion with normal breathing effort HEART: regular rate & rhythm and no murmurs and no lower extremity edema Musculoskeletal: no cyanosis of digits and no clubbing  PSYCH: alert & oriented x 3, fluent speech NEURO: no focal motor/sensory deficits  LABORATORY DATA:  I have reviewed the data as listed    Latest Ref Rng & Units 10/07/2024    3:51 PM 09/30/2024    3:49 PM 07/14/2024   10:33 AM  CBC  WBC 4.0 - 10.5 K/uL 12.4  11.8  9.9   Hemoglobin 12.0 - 15.0 g/dL 88.8  89.4  89.3   Hematocrit 36.0 - 46.0 % 35.0  32.5  32.9   Platelets 150 - 400 K/uL 360  331  302        Latest Ref Rng & Units 10/07/2024    3:51 PM 09/30/2024    3:49 PM 07/14/2024   10:33 AM  CMP  Glucose 70 - 99 mg/dL 87  88  89   BUN 6 - 20 mg/dL  10  10  6    Creatinine 0.44 - 1.00 mg/dL 9.27  9.25  9.38   Sodium 135 - 145 mmol/L 139  139  138   Potassium 3.5 - 5.1 mmol/L 3.8  3.9  4.0   Chloride 98 - 111 mmol/L 105  107  105   CO2 22 - 32 mmol/L 28  26  28    Calcium  8.9 - 10.3 mg/dL 9.1  9.0  8.9   Total Protein 6.5 - 8.1 g/dL 7.4  7.1  6.7   Total Bilirubin 0.0 - 1.2 mg/dL 0.2  0.2  0.2   Alkaline Phos 38 - 126 U/L 85  83  78   AST 15 - 41 U/L 16  15  16    ALT 0 - 44 U/L 12  11  11       RADIOGRAPHIC STUDIES: I have personally reviewed the radiological images as listed and agreed with the findings in the report. No results found.   ASSESSMENT & PLAN Enis L Craigo is a 43 y.o. female who presents to the hematology clinic for positive antiphospholipid syndrome with history of infarct at the right basal ganglia in July 2022 and  hospitalization for CRAO of right eye.   #Antiphospholipid syndrome #History of infarct of right basal ganglia #Central Retinal Artery Occlusion --Labs from 08/2023 showed cardiolipin IgM >112, beta-2  glycoprotein IgM >112  --Repeat labs from 01/08/2024 showed cardiolipin IgM 40. Beta-2  glycoprotein IgM >150.  --Currently on aspirin  81 mg, and warfarin 7.5 mg PO daily. --No overt signs of bleeding or bruising except for monthly menstrual cycles.  --Patient will follow up with PCP to maintain warfarin dose so INR is 2-3.   # Iron Deficiency Anemia 2/2 to GYN Bleeding #Anemia of  Chronic Disease  -- Findings are consistent with iron deficiency anemia secondary to menstrual bleeding and chronic disease.  --Unable to tolerate PO iron due to constipation --Labs today show mild anemia with Hgb 10.5, WBC 11.8, MCV 82.5, Plt 331  --No need for IV iron at this time.  --Continue to eat iron rich foods.   Follow up: --RTC in 3 months with repeat labs   No orders of the defined types were placed in this encounter.   All questions were answered. The patient knows to call the clinic with any problems,  questions or concerns.  I have spent a total of 25 minutes minutes of face-to-face and non-face-to-face time, preparing to see the patient, performing a medically appropriate examination, counseling and educating the patient, ordering medications/tests/procedures, referring and communicating with other health care professionals, documenting clinical information in the electronic health record, independently interpreting results and communicating results to the patient, and care coordination.   Norleen IVAR Kidney, MD Department of Hematology/Oncology [facility] Phone: [phone] Pager: [phone] Email: [email or web address]

## 2024-10-07 NOTE — Telephone Encounter (Signed)
 Called and LVM regards her appts

## 2024-10-09 ENCOUNTER — Encounter: Payer: Self-pay | Admitting: Hematology and Oncology

## 2024-10-09 DIAGNOSIS — D509 Iron deficiency anemia, unspecified: Secondary | ICD-10-CM | POA: Insufficient documentation

## 2024-10-10 ENCOUNTER — Telehealth: Payer: Self-pay | Admitting: Pharmacy Technician

## 2024-10-10 ENCOUNTER — Other Ambulatory Visit: Payer: Self-pay | Admitting: Rheumatology

## 2024-10-10 DIAGNOSIS — Z79899 Other long term (current) drug therapy: Secondary | ICD-10-CM

## 2024-10-10 DIAGNOSIS — D6861 Antiphospholipid syndrome: Secondary | ICD-10-CM

## 2024-10-10 MED ORDER — HYDROXYCHLOROQUINE SULFATE 200 MG PO TABS: 200.0000 mg | ORAL_TABLET | Freq: Two times a day (BID) | ORAL | 0 refills | Status: AC

## 2024-10-10 NOTE — Telephone Encounter (Signed)
 Patient contacted the office to request a medication refill.   1. Name of Medication: Plaquenil   2. How are you currently taking this medication (dosage and times per day)? One tablet two times a day   3. What pharmacy would you like for that to be sent to? [facility]- [address] [place]

## 2024-10-10 NOTE — Telephone Encounter (Signed)
 Auth Submission: NO AUTH NEEDED Site of care: Site of care: [facility] Payer: [employer]ICA[employer] Medication & CPT/J Code(s) submitted: Monoferric (Ferrci derisomaltose) [phone] Diagnosis Code: D50.9 Route of submission (phone, fax, portal):  Phone # Fax # Auth type: Buy/Bill PB Units/visits requested: X1 DOSE Reference number:  Approval from: 10/10/24 to 11/30/24

## 2024-10-10 NOTE — Telephone Encounter (Signed)
 Last Fill: 01/20/2024  Eye exam: scheduled 10/2024 per patient    Labs: 10/07/2024 WBC 12.4, hemoglobin 11.1, HCT 35.0, neutro abs 8.9  Next Visit: 12/23/2024  Last Visit: 06/29/2024  IK:Jwupeyndeynopepi syndrome   Current Dose per office note on 06/29/2024: Plaquenil  200 mg p.o. twice daily.   Patient states she is scheduled for plaquenil  eye exam in December 2025, she could not get a sooner appt.   Okay to refill Plaquenil ?

## 2024-10-11 ENCOUNTER — Ambulatory Visit: Admitting: Rheumatology

## 2024-10-11 DIAGNOSIS — F17200 Nicotine dependence, unspecified, uncomplicated: Secondary | ICD-10-CM

## 2024-10-11 DIAGNOSIS — Z6834 Body mass index (BMI) 34.0-34.9, adult: Secondary | ICD-10-CM

## 2024-10-11 DIAGNOSIS — F419 Anxiety disorder, unspecified: Secondary | ICD-10-CM

## 2024-10-11 DIAGNOSIS — Z8673 Personal history of transient ischemic attack (TIA), and cerebral infarction without residual deficits: Secondary | ICD-10-CM

## 2024-10-11 DIAGNOSIS — E559 Vitamin D deficiency, unspecified: Secondary | ICD-10-CM

## 2024-10-11 DIAGNOSIS — R7689 Other specified abnormal immunological findings in serum: Secondary | ICD-10-CM

## 2024-10-11 DIAGNOSIS — Z7901 Long term (current) use of anticoagulants: Secondary | ICD-10-CM

## 2024-10-11 DIAGNOSIS — Z8709 Personal history of other diseases of the respiratory system: Secondary | ICD-10-CM

## 2024-10-11 DIAGNOSIS — Z79899 Other long term (current) drug therapy: Secondary | ICD-10-CM

## 2024-10-11 DIAGNOSIS — I10 Essential (primary) hypertension: Secondary | ICD-10-CM

## 2024-10-11 DIAGNOSIS — F4310 Post-traumatic stress disorder, unspecified: Secondary | ICD-10-CM | POA: Diagnosis not present

## 2024-10-11 DIAGNOSIS — D6861 Antiphospholipid syndrome: Secondary | ICD-10-CM

## 2024-10-11 DIAGNOSIS — F411 Generalized anxiety disorder: Secondary | ICD-10-CM | POA: Diagnosis not present

## 2024-10-11 DIAGNOSIS — Z862 Personal history of diseases of the blood and blood-forming organs and certain disorders involving the immune mechanism: Secondary | ICD-10-CM

## 2024-10-11 DIAGNOSIS — F329 Major depressive disorder, single episode, unspecified: Secondary | ICD-10-CM | POA: Diagnosis not present

## 2024-10-11 DIAGNOSIS — G8929 Other chronic pain: Secondary | ICD-10-CM

## 2024-10-11 DIAGNOSIS — Z820 Family history of epilepsy and other diseases of the nervous system: Secondary | ICD-10-CM

## 2024-10-14 DIAGNOSIS — R0602 Shortness of breath: Secondary | ICD-10-CM | POA: Diagnosis not present

## 2024-10-17 DIAGNOSIS — E559 Vitamin D deficiency, unspecified: Secondary | ICD-10-CM | POA: Diagnosis not present

## 2024-10-17 DIAGNOSIS — I10 Essential (primary) hypertension: Secondary | ICD-10-CM | POA: Diagnosis not present

## 2024-10-17 DIAGNOSIS — R791 Abnormal coagulation profile: Secondary | ICD-10-CM | POA: Diagnosis not present

## 2024-10-17 DIAGNOSIS — R7303 Prediabetes: Secondary | ICD-10-CM | POA: Diagnosis not present

## 2024-10-19 DIAGNOSIS — I10 Essential (primary) hypertension: Secondary | ICD-10-CM | POA: Diagnosis not present

## 2024-10-19 DIAGNOSIS — E282 Polycystic ovarian syndrome: Secondary | ICD-10-CM | POA: Diagnosis not present

## 2024-10-19 DIAGNOSIS — F4310 Post-traumatic stress disorder, unspecified: Secondary | ICD-10-CM | POA: Diagnosis not present

## 2024-10-19 DIAGNOSIS — D509 Iron deficiency anemia, unspecified: Secondary | ICD-10-CM | POA: Diagnosis not present

## 2024-10-19 DIAGNOSIS — Z7689 Persons encountering health services in other specified circumstances: Secondary | ICD-10-CM | POA: Diagnosis not present

## 2024-10-19 DIAGNOSIS — E559 Vitamin D deficiency, unspecified: Secondary | ICD-10-CM | POA: Diagnosis not present

## 2024-10-19 DIAGNOSIS — Z7901 Long term (current) use of anticoagulants: Secondary | ICD-10-CM | POA: Diagnosis not present

## 2024-10-19 DIAGNOSIS — D6861 Antiphospholipid syndrome: Secondary | ICD-10-CM | POA: Diagnosis not present

## 2024-10-19 DIAGNOSIS — F322 Major depressive disorder, single episode, severe without psychotic features: Secondary | ICD-10-CM | POA: Diagnosis not present

## 2024-10-19 DIAGNOSIS — F411 Generalized anxiety disorder: Secondary | ICD-10-CM | POA: Diagnosis not present

## 2024-10-19 DIAGNOSIS — R7303 Prediabetes: Secondary | ICD-10-CM | POA: Diagnosis not present

## 2024-10-19 DIAGNOSIS — Z720 Tobacco use: Secondary | ICD-10-CM | POA: Diagnosis not present

## 2024-10-20 ENCOUNTER — Ambulatory Visit: Payer: Self-pay

## 2024-10-20 DIAGNOSIS — Z8673 Personal history of transient ischemic attack (TIA), and cerebral infarction without residual deficits: Secondary | ICD-10-CM | POA: Diagnosis not present

## 2024-10-20 DIAGNOSIS — Z7901 Long term (current) use of anticoagulants: Secondary | ICD-10-CM | POA: Diagnosis not present

## 2024-10-20 DIAGNOSIS — D6861 Antiphospholipid syndrome: Secondary | ICD-10-CM | POA: Diagnosis not present

## 2024-10-20 NOTE — Telephone Encounter (Signed)
 TC to pt with most recent lab results. Spoke with pt and advised of order for IV infusion.  She stated she had been phoned by center and aware of appt 11/24.  Given physical address and phone number of infusion center.

## 2024-10-20 NOTE — Telephone Encounter (Signed)
-----   Message from Nurse Almarie T sent at 10/20/2024  9:00 AM EST -----  ----- Message ----- From: Federico Norleen ONEIDA MADISON, MD Sent: 10/09/2024   2:32 PM EST To: Almarie DELENA Arabia, RN  Please let Mrs. Opie know that her labs are consistent with persistent iron deficiency anemia.  I have ordered a round of IV iron therapy to help bolster her iron levels. ----- Message ----- From: Rebecka, [facility] [place] Sent: 10/07/2024   4:05 PM EST To: Norleen ONEIDA Federico MADISON, MD

## 2024-10-24 ENCOUNTER — Inpatient Hospital Stay (HOSPITAL_COMMUNITY): Admission: RE | Admit: 2024-10-24 | Source: Ambulatory Visit

## 2024-10-24 DIAGNOSIS — D6861 Antiphospholipid syndrome: Secondary | ICD-10-CM | POA: Diagnosis not present

## 2024-11-10 ENCOUNTER — Institutional Professional Consult (permissible substitution): Admitting: Neurology

## 2024-11-10 ENCOUNTER — Telehealth: Payer: Self-pay | Admitting: Neurology

## 2024-11-10 NOTE — Telephone Encounter (Signed)
 MYC cxl

## 2024-11-16 DIAGNOSIS — S0031XA Abrasion of nose, initial encounter: Secondary | ICD-10-CM | POA: Diagnosis not present

## 2024-11-16 DIAGNOSIS — W010XXA Fall on same level from slipping, tripping and stumbling without subsequent striking against object, initial encounter: Secondary | ICD-10-CM | POA: Diagnosis not present

## 2024-11-21 ENCOUNTER — Inpatient Hospital Stay (HOSPITAL_COMMUNITY): Admission: RE | Admit: 2024-11-21

## 2024-11-21 ENCOUNTER — Encounter (HOSPITAL_COMMUNITY): Payer: Self-pay

## 2024-12-09 NOTE — Progress Notes (Unsigned)
 "  Office Visit Note  Patient: [name] Costa             Date of Birth: Jun 03, 1981           MRN: [id number]             PCP: [facility] Referring: [facility] Visit Date: 12/23/2024 Occupation: ext; 3112  Subjective:  No chief complaint on file.   History of Present Illness: [name] Costa is a 44 y.o. female ***     Activities of Daily Living:  Patient reports morning stiffness for *** {minute/hour:19697}.   Patient {ACTIONS;DENIES/REPORTS:21021675::Denies} nocturnal pain.  Difficulty dressing/grooming: {ACTIONS;DENIES/REPORTS:21021675::Denies} Difficulty climbing stairs: {ACTIONS;DENIES/REPORTS:21021675::Denies} Difficulty getting out of chair: {ACTIONS;DENIES/REPORTS:21021675::Denies} Difficulty using hands for taps, buttons, cutlery, and/or writing: {ACTIONS;DENIES/REPORTS:21021675::Denies}  No Rheumatology ROS completed.   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   TOBACCO ABUSE 10/15/2009   ALLERGIC RHINITIS 10/15/2009   ASTHMA 10/15/2009    Past Medical History:  Diagnosis Date   Antiphospholipid syndrome    Bladder infection    Chest wall pain    PCOS (polycystic ovarian syndrome)    Stroke (HCC) 06/04/2021   Trichomonas     Family History  Problem Relation Age of Onset   Hypertension Mother    Hypertension Father    Hypertension Sister    Hypertension Brother    Multiple sclerosis Brother    Breast cancer Maternal Grandmother    Healthy Daughter    Autism Son    Past Surgical History:  Procedure Laterality Date   BREAST BIOPSY Right 05/25/2023   US  RT BREAST BX W LOC DEV 1ST LESION IMG BX SPEC US   GUIDE 05/25/2023 [facility] MAMMOGRAPHY   BUBBLE STUDY  06/06/2021   Procedure: BUBBLE STUDY;  Surgeon: Kate Lonni CROME, MD;  Location: [facility] ENDOSCOPY;  Service: Cardiovascular;;   CHOLECYSTECTOMY     CHOLECYSTECTOMY     COLONOSCOPY  2006   TEE WITHOUT CARDIOVERSION N/A 06/06/2021   Procedure: TRANSESOPHAGEAL ECHOCARDIOGRAM (TEE);  Surgeon: Kate Lonni CROME, MD;  Location: [facility] ENDOSCOPY;  Service: Cardiovascular;  Laterality: N/A;   TUBAL LIGATION Bilateral 04/13/2013   Procedure: POST PARTUM TUBAL LIGATION;  Surgeon: Debby JULIANNA Lares, MD;  Location: [facility]S;  Service: Gynecology;  Laterality: Bilateral;   Social History[1] Social History   Social History Narrative   Not on file     There is no immunization history for the selected administration types on file for this patient.   Objective: Vital Signs: There were no vitals taken for this visit.   Physical Exam   Musculoskeletal Exam: ***  CDAI Exam: CDAI Score: -- Patient Global: --; Provider Global: -- Swollen: --; Tender: -- Joint Exam 12/23/2024   No joint exam has been documented for this visit   There is currently no information documented on the homunculus. Go to the Rheumatology activity and complete the homunculus joint exam.  Investigation: No additional findings.  Imaging: No results found.  Recent Labs: Lab Results  Component Value Date   WBC 12.4 (H) 10/07/2024   HGB 11.1 (L) 10/07/2024   PLT 360 10/07/2024   NA 139 10/07/2024   K 3.8 10/07/2024   CL  105 10/07/2024   CO2 28 10/07/2024   GLUCOSE 87 10/07/2024   BUN 10 10/07/2024   CREATININE 0.72 10/07/2024   BILITOT 0.2 10/07/2024   ALKPHOS 85 10/07/2024   AST 16 10/07/2024   ALT 12 10/07/2024   PROT 7.4 10/07/2024   ALBUMIN 4.0 10/07/2024   CALCIUM  9.1 10/07/2024    Speciality Comments: Plaquenil  02/25  PLQ eye exam: scheduled 10/2024 per patient  Procedures:  No procedures performed Allergies: Latex   Assessment / Plan:      Visit Diagnoses: No diagnosis found.  Orders: No orders of the defined types were placed in this encounter.  No orders of the defined types were placed in this encounter.   Face-to-face time spent with patient was *** minutes. Greater than 50% of time was spent in counseling and coordination of care.  Follow-Up Instructions: No follow-ups on file.   Daved JAYSON Gavel, CMA  Note - This record has been created using Animal nutritionist.  Chart creation errors have been sought, but may not always  have been located. Such creation errors do not reflect on  the standard of medical care.    [1]  Social History Tobacco Use   Smoking status: Every Day    Types: Cigarettes    Passive exposure: Never   Smokeless tobacco: Never   Tobacco comments:    5-6 cigarettes daily. Smoked Off and on since a teenager  Vaping Use   Vaping status: Former  Substance Use Topics   Alcohol use: Not Currently   Drug use: Yes    Types: Marijuana    Comment: few times per week   "

## 2024-12-15 ENCOUNTER — Telehealth (HOSPITAL_COMMUNITY): Payer: Self-pay

## 2024-12-15 NOTE — Telephone Encounter (Signed)
 Message sent to patient's provider to ask if monoferric plan should be d/c or if patient is to still receive this. Awaiting response.

## 2024-12-23 ENCOUNTER — Ambulatory Visit: Attending: Rheumatology | Admitting: Rheumatology

## 2024-12-23 DIAGNOSIS — Z7901 Long term (current) use of anticoagulants: Secondary | ICD-10-CM

## 2024-12-23 DIAGNOSIS — I10 Essential (primary) hypertension: Secondary | ICD-10-CM

## 2024-12-23 DIAGNOSIS — Z6834 Body mass index (BMI) 34.0-34.9, adult: Secondary | ICD-10-CM

## 2024-12-23 DIAGNOSIS — Z8709 Personal history of other diseases of the respiratory system: Secondary | ICD-10-CM

## 2024-12-23 DIAGNOSIS — R7689 Other specified abnormal immunological findings in serum: Secondary | ICD-10-CM

## 2024-12-23 DIAGNOSIS — Z820 Family history of epilepsy and other diseases of the nervous system: Secondary | ICD-10-CM

## 2024-12-23 DIAGNOSIS — Z79899 Other long term (current) drug therapy: Secondary | ICD-10-CM

## 2024-12-23 DIAGNOSIS — Z8673 Personal history of transient ischemic attack (TIA), and cerebral infarction without residual deficits: Secondary | ICD-10-CM

## 2024-12-23 DIAGNOSIS — E559 Vitamin D deficiency, unspecified: Secondary | ICD-10-CM

## 2024-12-23 DIAGNOSIS — F419 Anxiety disorder, unspecified: Secondary | ICD-10-CM

## 2024-12-23 DIAGNOSIS — Z862 Personal history of diseases of the blood and blood-forming organs and certain disorders involving the immune mechanism: Secondary | ICD-10-CM

## 2024-12-23 DIAGNOSIS — D6861 Antiphospholipid syndrome: Secondary | ICD-10-CM

## 2024-12-23 DIAGNOSIS — G8929 Other chronic pain: Secondary | ICD-10-CM

## 2024-12-23 DIAGNOSIS — F17200 Nicotine dependence, unspecified, uncomplicated: Secondary | ICD-10-CM

## 2025-01-04 ENCOUNTER — Telehealth: Payer: Self-pay | Admitting: Hematology and Oncology

## 2025-01-04 NOTE — Telephone Encounter (Signed)
 I returned pt call to eschedule upcoming appt

## 2025-01-05 ENCOUNTER — Inpatient Hospital Stay: Admitting: Hematology and Oncology

## 2025-01-05 ENCOUNTER — Inpatient Hospital Stay

## 2025-01-27 ENCOUNTER — Inpatient Hospital Stay: Admitting: Hematology and Oncology

## 2025-01-27 ENCOUNTER — Inpatient Hospital Stay: Attending: Physician Assistant

## 2025-02-02 ENCOUNTER — Ambulatory Visit: Admitting: Rheumatology
